# Patient Record
Sex: Female | Born: 1955 | Race: Black or African American | Hispanic: No | Marital: Single | Smoking: Former smoker
Health system: Southern US, Community
[De-identification: ages and names within clinical notes are randomized; demographics above are authoritative.]

## PROBLEM LIST (undated history)

## (undated) ENCOUNTER — Inpatient Hospital Stay: Admission: EM | Payer: Self-pay | Source: Home / Self Care

## (undated) DIAGNOSIS — R05 Cough: Secondary | ICD-10-CM

## (undated) DIAGNOSIS — R059 Cough, unspecified: Secondary | ICD-10-CM

## (undated) DIAGNOSIS — H7290 Unspecified perforation of tympanic membrane, unspecified ear: Secondary | ICD-10-CM

## (undated) DIAGNOSIS — M1990 Unspecified osteoarthritis, unspecified site: Secondary | ICD-10-CM

## (undated) DIAGNOSIS — I10 Essential (primary) hypertension: Secondary | ICD-10-CM

## (undated) DIAGNOSIS — E119 Type 2 diabetes mellitus without complications: Secondary | ICD-10-CM

## (undated) DIAGNOSIS — C801 Malignant (primary) neoplasm, unspecified: Secondary | ICD-10-CM

## (undated) DIAGNOSIS — C50411 Malignant neoplasm of upper-outer quadrant of right female breast: Secondary | ICD-10-CM

## (undated) HISTORY — DX: Malignant neoplasm of upper-outer quadrant of right female breast: C50.411

## (undated) HISTORY — PX: ABDOMINAL HYSTERECTOMY: SHX81

## (undated) HISTORY — PX: COLONOSCOPY: SHX174

## (undated) HISTORY — PX: OTHER SURGICAL HISTORY: SHX169

---

## 2006-02-06 ENCOUNTER — Emergency Department (HOSPITAL_COMMUNITY): Admission: EM | Admit: 2006-02-06 | Discharge: 2006-02-06 | Payer: Self-pay | Admitting: Emergency Medicine

## 2006-07-16 ENCOUNTER — Ambulatory Visit: Payer: Self-pay | Admitting: Internal Medicine

## 2006-07-16 ENCOUNTER — Inpatient Hospital Stay (HOSPITAL_COMMUNITY): Admission: EM | Admit: 2006-07-16 | Discharge: 2006-07-19 | Payer: Self-pay | Admitting: Emergency Medicine

## 2006-09-22 ENCOUNTER — Ambulatory Visit: Payer: Self-pay | Admitting: Orthopedic Surgery

## 2006-09-22 ENCOUNTER — Emergency Department (HOSPITAL_COMMUNITY): Admission: EM | Admit: 2006-09-22 | Discharge: 2006-09-22 | Payer: Self-pay | Admitting: Emergency Medicine

## 2006-09-24 ENCOUNTER — Ambulatory Visit (HOSPITAL_COMMUNITY): Admission: RE | Admit: 2006-09-24 | Discharge: 2006-09-24 | Payer: Self-pay | Admitting: Orthopedic Surgery

## 2006-09-24 ENCOUNTER — Ambulatory Visit: Payer: Self-pay | Admitting: Orthopedic Surgery

## 2006-09-27 ENCOUNTER — Emergency Department (HOSPITAL_COMMUNITY): Admission: EM | Admit: 2006-09-27 | Discharge: 2006-09-27 | Payer: Self-pay | Admitting: Emergency Medicine

## 2006-10-11 ENCOUNTER — Ambulatory Visit: Payer: Self-pay | Admitting: Orthopedic Surgery

## 2006-10-19 ENCOUNTER — Ambulatory Visit: Payer: Self-pay | Admitting: Orthopedic Surgery

## 2006-10-27 ENCOUNTER — Ambulatory Visit: Payer: Self-pay | Admitting: Orthopedic Surgery

## 2006-11-10 ENCOUNTER — Ambulatory Visit: Payer: Self-pay | Admitting: Orthopedic Surgery

## 2006-12-13 ENCOUNTER — Ambulatory Visit: Payer: Self-pay | Admitting: Orthopedic Surgery

## 2007-03-23 ENCOUNTER — Ambulatory Visit: Payer: Self-pay | Admitting: Orthopedic Surgery

## 2008-09-24 ENCOUNTER — Emergency Department (HOSPITAL_COMMUNITY): Admission: EM | Admit: 2008-09-24 | Discharge: 2008-09-24 | Payer: Self-pay | Admitting: Emergency Medicine

## 2010-10-16 LAB — RAPID STREP SCREEN (MED CTR MEBANE ONLY): Streptococcus, Group A Screen (Direct): POSITIVE — AB

## 2010-11-21 NOTE — H&P (Signed)
[name], [name]              ACCOUNT NO.:  [id number]   MEDICAL RECORD NO.:  [id number]          PATIENT TYPE:  AMB   LOCATION:  DAY                           FACILITY:  [facility]   PHYSICIAN:  Vickki Hearing, M.D.DATE OF BIRTH:  06-06-1956   DATE OF ADMISSION:  09/22/2006  DATE OF DISCHARGE:  LH                              HISTORY & PHYSICAL   CHIEF COMPLAINT:  Right wrist pain.   HISTORY OF PRESENT ILLNESS:  This is a 55 year old female with no  allergies or medical problems.  No previous surgery.  On no medications.  Family history of diabetes.  Employed as a quality associated as an  Nurse, adult.  Has a twelfth grade education and no social  habits.  Presents after a fall on March 19 in which she injured her  right wrist.  She complains of constant, dull, aching, acute onset pain  grade 10/10 in the right wrist associated with swelling.  She was  treated with splint in the ER and a shot of pain medications.   REVIEW OF SYSTEMS:  Negative.   PHYSICAL EXAMINATION:  VITAL SIGNS:  Weight 210, pulse 80, respirations  16.  GENERAL:  Appearance normal, pulse is excellent, no swelling, negative  lymphadenopathy.  MUSCULOSKELETAL:  Normal gait, but her wrist is deformed, tender,  swollen, crepitance on range of motion, increased pain with motion, poor  grip strength and poor ability to move the fingers.  SKIN:  Multiple abrasions right eye, right knee, left elbow, right  shoulder.  NEUROLOGICAL:  Normal.  PSYCHIATRIC:  Awake, alert and oriented x3.   STUDIES:  X-rays done at [facility] show a volarly displaced  comminuted distal radius fracture.   PLAN:  Open treatment internal fixation, right wrist with a DVR plate  from a volar approach (code [id number])      Vickki Hearing, M.D.  Electronically Signed     SEH/MEDQ  D:  09/22/2006  T:  09/23/2006  Job:  [id number]

## 2010-11-21 NOTE — Discharge Summary (Signed)
[name], [name]              ACCOUNT NO.:  [id number]   MEDICAL RECORD NO.:  [id number]          PATIENT TYPE:  INP   LOCATION:  A304                          FACILITY:  [facility]   PHYSICIAN:  Skeet Latch        DATE OF BIRTH:  Oct 03, 1955   DATE OF ADMISSION:  07/16/2006  DATE OF DISCHARGE:  01/14/2008LH                               DISCHARGE SUMMARY   DISCHARGE DIAGNOSES:  1. Right lower quadrant pain.  2. Hypokalemia.  3. Hypertension.   HOSPITAL COURSE:  [name] is a 55 year old African-American female  who presented to the ER with complaint of right lower abdominal pain.  The patient states the pain was not radiating.  She denied any  significant history of any previous abdominal pain or history of  vomiting or urinary complaints.  Due to the complaints and severe  nature, patient was admitted.  Patient had a CT of her abdomen and  pelvis which were negative.  Patient also had an ultrasound of her  abdomen which was also negative.  The patient also had a nuclear  medicine hepatobiliary scan which showed a low gallbladder ejection  fraction but was otherwise normal.  Patient received IV pain medication  as well as IV antibiotics which have since been changed p.o.  Patient's  labs are normal and her pain is no longer present and patient is ready  for discharge.   MEDICATIONS ON DISCHARGE:  1. Protonix 40 mg daily.  2. Levaquin 500 mg p.o. daily x7 days.  3. Aspirin 81 mg p.o. daily.   The patient instructed to have a low sodium diet, also to follow up with  a primary care physician within 1-2 weeks.                                            ______________________________  Skeet Latch     SM/MEDQ  D:  07/19/2006  T:  07/19/2006  Job:  [id number]

## 2010-11-21 NOTE — Consult Note (Signed)
NAMELUCIANNE, [name]              ACCOUNT NO.:  [id number]   MEDICAL RECORD NO.:  [id number]          PATIENT TYPE:  INP   LOCATION:  A304                          FACILITY:  [facility]   PHYSICIAN:  Lionel December, M.D.    DATE OF BIRTH:  [birth year]   DATE OF CONSULTATION:  07/17/2006  DATE OF DISCHARGE:                                 CONSULTATION   REASON FOR CONSULTATION:  Abdominal pain and abnormal ultrasound.   HISTORY OF PRESENT ILLNESS:  [name] is a 55 year old Caucasian female  who was admitted to Dr. Maxwell Caul service yesterday via emergency room  where she presented with a 12-hour history of lower abdominal pain.   The patient states she was fine until the evening of July 15, 2006.  Within 30 minutes of eating spaghetti and drinking some cool-aid she  developed sharp pain across her lower abdomen.  It was a constant pain  but at times will get even worse, associated with nausea.  She did not  experience vomiting, fever, diarrhea, dysuria, or hematuria.  The pain  continued throughout the night.  She came to the emergency room  yesterday morning.  She was evaluated in the emergency room.  She had  lab studies.  Her WBC was mildly elevated at 11,000.  Her chemistry  panel was pertinent for a glucose of 165, and her alkaline phosphatase  was mildly elevated at 145.  She apparently was in emergency room for  several hours.  She had an upper abdominal ultrasound which revealed a  bile duct of 6.1 mm, and there were one or two echogenic foci within the  distal duct suspicious for stones.  There were no stones in the  gallbladder.  She subsequently had abdominopelvic CT without contrast,  and no abnormalities were noted.  Specifically, there was no evidence of  appendicitis.  The patient was begun on IV fluids and IV antibiotics.  She finally had a hepatobiliary scan which revealed patent cystic ducts.  Gallbladder visualized within 15 minutes, and activity was seen.  Her  ejection fraction was calculated to be low at 9.8%.   The patient feels better this afternoon.  She states the pain is now  located in the right lower quadrant.  It is constant but at times more  noticeable.  She has not asked for any pain medicines.  She has been  keeping clear liquids down.  She has not had a BM since she was  hospitalized.  There is no history of headache, sore throat, skin rash,  or joint pains.  There is also no history of recent weight loss or  anorexia.  She also denies heartburn or dysphagia.   HOME MEDICATIONS:  1. Zocor, question dose.  2. Norvasc daily.  3. KCl.   CURRENT MEDICATIONS:  1. Lovenox 40 mg subcutaneously q.24h.  2. Levaquin 750 mg IV q.24h.  3. Metronidazole 5 mg IV q.8h.  4. Protonix 40 mg IV q.24h.  5. Tylenol p.r.n.   PAST MEDICAL HISTORY:  1. She has been hypertensive for few years.  2. She has hypercholesterolemia.  3. She had left  ear surgery for drum perforation about 15 years ago.      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  '  4. History of cervical carcinoma, and she had hysterectomy 28 years      ago.   ALLERGIES:  NONE KNOWN.   FAMILY HISTORY:  Mother was hypertensive and died of MI at age 68.  Father had unknown malignancy and died at age 78.  She has two sisters  and six brothers who are in good health.   SOCIAL HISTORY:  She has never married.  She has two sons in good  health.  She has been working at [employer] for the last 3  years, and prior to that she worked at [employer].  She has never  smoked cigarettes.  She drinks alcohol occasionally.   PHYSICAL EXAMINATION:  GENERAL:  Pleasant, well-developed, well-  nourished [place] American female who is in no acute distress.  VITAL SIGNS:  She weighs 191.8 pounds.  She 64 inches tall.  Pulse 92  per minute, blood pressure 131/90, temperature is 98.4, respiratory rate  is 18.  HEENT:  Conjunctivae is pink.  Sclerae nonicteric.  Oral pharyngeal   mucosa is normal.  NECK:  No neck masses are noted.  CARDIAC:  Regular rhythm.  Normal S1-S2.  No murmur or gallop noted.  LUNGS:  Clear to auscultation.  ABDOMEN:  Her abdomen is symmetrical.  Bowel sounds are normal.  She has  soft abdomen with mild-to-moderate tenderness at the right lower  quadrant which is laterally close to anterior superior iliac spine.  No  hepatosplenomegaly noted.  RECTAL:  Examination reveals scant amount of stool in the vault which is  guaiac-negative.  EXTREMITIES:  No clubbing or edema noted.   LABORATORY DATA:  Lab studies from admission:  WBC 11.0, H&H 14.9 and  43.3, platelet count 275 K.  She had 81 neutrophils.  Serum sodium 137,  potassium 3.6, chloride 103, CO2 is 25, glucose 165, BUN 7, creatinine  0.73.  Bilirubin is 0.8, AP 145, AST 23, ALT 28, total protein 7.4, with  albumin of 4.0, calcium of 9.6, amylase was 59, lipase 24.  Urinalysis  was negative.   Her WBC from this morning is 12.0 with 71 segs and 0 bands.  Her glucose  was 145.  Total bilirubin 1.3, alkaline phosphatase is 124, AST 16, ALT  22, and albumin 3.1.   CT and ultrasound reviewed.   ASSESSMENT:  [name] is a 55 year old African-American female who was  admitted with acute onset of lower abdominal pain which is now localized  to the right lower quadrant.  She has improved with symptomatic therapy.  She is on intravenous Cipro and metronidazole.  She has mild  leukocytosis.  She also has mildly elevated alkaline phosphatase with  downward trend but normal transaminases, amylase, and lipase.   As far as her biliary tract is concerned, I am not convinced that she  has stones in her bile duct.  Her bile duct is normal caliber, and  transaminases are normal.  Mildly elevated alkaline phosphatase is  really nonspecific.  I doubt that she has biliary tract disease, but her  ultrasound will be reviewed with the radiologist.  Right lower quadrant abdominal pain.  I suspect  enteritis or mesenteric  adenitis, although no evidence of an adenopathy on unenhanced CT.  Although no evidence of appendicitis, but this cannot be ruled out yet.   RECOMMENDATIONS:  She will have  CBC and LFTs in a.m.   Would agree with empiric therapy with antibiotic therapy for now.  Next,  I have taken the liberty of ordering Dilaudid 1 mg IV q.4h. p.r.n. pain.  The patient will be reevaluated in the a.m., and if she remains with  pain and tenderness, she will need to undergo repeat abdominopelvic CT  with oral and IV contrast.   We appreciate the opportunity to participate in the care of this nice  lady.      Lionel December, M.D.  Electronically Signed     NR/MEDQ  D:  07/17/2006  T:  07/18/2006  Job:  [id number]

## 2010-11-21 NOTE — Op Note (Signed)
NAMEJAZYAH, [name]              ACCOUNT NO.:  [id number]   MEDICAL RECORD NO.:  [id number]          PATIENT TYPE:  AMB   LOCATION:  DAY                           FACILITY:  [facility]   PHYSICIAN:  Vickki Hearing, M.D.DATE OF BIRTH:  1956-05-15   DATE OF PROCEDURE:  09/24/2006  DATE OF DISCHARGE:                               OPERATIVE REPORT   HISTORY:  This is a 55 year old female who presented after a fall.  She  sustained an intra-articular two-part fracture of the distal radius.  It  was a closed injury.  There was significant volar displacement and  deformity.  She was scheduled for surgery for today, September 24, 2006.  Informed consent process completed.   She has no allergies or medical problems and no previous surgery.  She  has a family history of diabetes   PREOPERATIVE DIAGNOSIS:  Closed fracture, right distal radius, intra-  articular, two-part.   POSTOPERATIVE DIAGNOSIS:  Closed fracture, right distal radius, intra-  articular, two-part.   PROCEDURE:  Open treatment and internal fixation, right wrist, with the  DVR volar plate and 1 mL of DBX bone putty.   SURGEON:  Vickki Hearing, M.D.   ASSISTANT:  Chinita Pester.   ANESTHESIA:  General.   SPECIMENS:  None.   PATHOLOGY:  None.   ESTIMATED BLOOD LOSS:  Minimal.   COMPLICATIONS:  None.   DISPOSITION:  The patient to PACU in good condition.   DESCRIPTION OF PROCEDURE:  This patient was identified in the  preoperative holding area as [name].  The right wrist marked  for surgery, countersigned by the surgeon.  History and physical  updated.  Antibiotics were started.  The patient was taken to the  operating room for general anesthetic.  The right arm was prepped and  draped with sterile technique.  A timeout procedure was completed.   A modified Henry approach to the distal radius was performed.  A skin  incision was made along the flexor carpi radialis tendon.  The tendon  sheath  was opened up.  Dissection was carried out to the superficial  branch of the radial artery, and then disk dissection was extended down  to the scaphoid tubercle.  The tendon was retracted radially.  The floor  of the tendon was opened.  The pronator quadratus was incised and  retracted ulnarly.  The floor of the extensor compartment #1 was opened.  The brachial radialis was then released.  Open fracture reduction was  performed.  Volar wrist plate was applied using technique as described  in the technique guide.  We used regular screws proximally, pegs and  threaded screws distally on the AP, lateral, and modified 30-degree  lateral views.  The reduction was very good.  There was a gap volarly,  and this was filled with DBX bone putty.  The wound was irrigated and  closed with 2-0 Monocryl, and we injected 20 mL of Marcaine into the  skin edges.  She was placed in a volar splint in slight extension.  The  tourniquet was released.  Fingers were viable.  ADDENDUM:  Partial laceration of the abductor pollicis longus was  repaired with nonabsorbable suture.   POSTOPERATIVE PLAN:  This patient will be seen by Dr. Hilda Lias on Monday.  That appointment has been scheduled for 9:00 a.m.  At that time, she  just needs to have a cast check for swelling.  It is okay to remove the  splint if needed for a dressing change if necessary; otherwise, she can  follow up in my office the next week.      Vickki Hearing, M.D.  Electronically Signed     SEH/MEDQ  D:  09/24/2006  T:  09/24/2006  Job:  [id number]   cc:   Teola Bradley, M.D.  Fax: [phone]

## 2010-11-21 NOTE — H&P (Signed)
Christine [name], Christine [name]              ACCOUNT NO.:  [id number]   MEDICAL RECORD NO.:  [id number]          PATIENT TYPE:  INP   LOCATION:  A304                          FACILITY:  [facility]   PHYSICIAN:  Christine Moores, MD   DATE OF BIRTH:  [birth year]   DATE OF ADMISSION:  07/16/2006  DATE OF DISCHARGE:  LH                              HISTORY & PHYSICAL   PRIMARY CARE PHYSICIAN:  Unassigned.   CHIEF COMPLAINT:  Right lower abdominal pain for one day's duration.   HISTORY OF PRESENT ILLNESS:  Ms. Christine [name] is a 55 year old female patient  with no significant past medical history, who presented to the ER with  right lower abdominal pain, which is localized on the umbilical area  towards the right lower abdomen, about 7/10 in severity.  Not radiating.  Localizes to the specified area.  She denied any history of vomiting.  No urinary frequency or urgency.  She did not notice any discoloration  of her urine.  She denied any diarrhea.  Her last bowel movement was  yesterday evening.  No fever.  No chest pain.  No cough.  Mild headache.  She denied any vaginal discharge.   REVIEW OF SYSTEMS:  As detailed in the HPI, otherwise it is  noncontributory.   ALLERGIES:  No known drug allergies.   MEDICATIONS:  None.   PAST MEDICAL HISTORY:  None.   SOCIAL HISTORY:  She is single, living alone.  She has two sons.  She  said she is working currently and she denied smoking.  She denied  alcohol intake.  She denied any illicit drugs.   FAMILY HISTORY:  She said there is no family medical problem that she is  aware of.   PHYSICAL EXAMINATION:  GENERAL:  She is lying in the bed in no acute  respiratory distress.  VITAL SIGNS:  Blood pressure is 155/92.  Temperature is 98.4.  Pulse is  86 per minute.  Respiratory rate is 20 per minute.  Saturating at 97% on  room air.  HEENT:  She has pink conjunctivae, anicteric sclerae.  Throat is clear.  NECK:  Supple.  No lymphadenopathy.  CHEST:  Good air  entry bilaterally.  No  creps  CVS:  She has S1 and S2, regular, well heard.  No murmur or gallop.  ABDOMEN:  Full.  Soft.  There is localized tenderness on the right lower  quadrant area, but there is no guarding or rebound tenderness currently.  No rigidity.  No mass or organomegaly is appreciated.  MUSCULOSKELETAL:  No abnormality detected.  GENITOURINARY:  Normal.  SKIN:  Moist.  There are no abnormal lesions found.  EXTREMITIES:  No pedal edema.  Peripheral pulses are positive  bilaterally.  CNS:  Alert and oriented x3.   On the labs.  white blood cell count, 11,000, hematocrit 43.3, platelets 275.  Chemistries:  Sodium is 137, potassium 3.6, chloride 103, bicarb 25, BUN  7, creatinine 0.7, calcium 9.6.  Urinalysis is negative.   CHEST X-RAY:  No acute abnormalities.   Pelvic CAT scan without contrast showed no acute abnormality.  CAT  scan  of the abdomen without contrast showed no acute abnormality in the  abdomen, but there is suspicion of small stones in the distal common  bile duct.  The common duct is slightly prominent at 6.1 mm but no  visible stones or sludge are seen.   HIDA also was done for suspicious for of cholecystitis, but it was not  conclusive.   ASSESSMENT:  Acute right-sided lower abdominal pain with no objective  finding of acute surgical abdomen with suspicion of cholecystitis versus  acute appendicitis.  The plan is to admit her to the floor and give her  antibiotics, IV fluids, and reassess her abdomen and her vital signs.  Send CBC and chemistry in the morning.  Dr. Malvin [name] will see her as a  surgical consultation.  Already he was informed by the ER physician and  he said he would come in and reassess her for surgical abdomen and  possible gallstones also.      Christine Moores, MD  Electronically Signed     MT/MEDQ  D:  07/16/2006  T:  07/16/2006  Job:  [id number]

## 2013-12-24 ENCOUNTER — Emergency Department (HOSPITAL_COMMUNITY)
Admission: EM | Admit: 2013-12-24 | Discharge: 2013-12-24 | Disposition: A | Discharge status: Home / Self Care | Payer: 59 | Attending: Emergency Medicine | Admitting: Emergency Medicine

## 2013-12-24 ENCOUNTER — Encounter (HOSPITAL_COMMUNITY): Payer: Self-pay | Admitting: Emergency Medicine

## 2013-12-24 DIAGNOSIS — R51 Headache: Secondary | ICD-10-CM | POA: Insufficient documentation

## 2013-12-24 DIAGNOSIS — Z8669 Personal history of other diseases of the nervous system and sense organs: Secondary | ICD-10-CM | POA: Insufficient documentation

## 2013-12-24 DIAGNOSIS — Z87891 Personal history of nicotine dependence: Secondary | ICD-10-CM | POA: Insufficient documentation

## 2013-12-24 DIAGNOSIS — R519 Headache, unspecified: Secondary | ICD-10-CM

## 2013-12-24 DIAGNOSIS — I10 Essential (primary) hypertension: Secondary | ICD-10-CM | POA: Insufficient documentation

## 2013-12-24 DIAGNOSIS — E119 Type 2 diabetes mellitus without complications: Secondary | ICD-10-CM | POA: Insufficient documentation

## 2013-12-24 HISTORY — DX: Type 2 diabetes mellitus without complications: E11.9

## 2013-12-24 HISTORY — DX: Essential (primary) hypertension: I10

## 2013-12-24 HISTORY — DX: Unspecified perforation of tympanic membrane, unspecified ear: H72.90

## 2013-12-24 MED ORDER — ONDANSETRON 4 MG PO TBDP
4.0000 mg | ORAL_TABLET | Freq: Once | ORAL | Status: AC
  Administered 2013-12-24: 4 mg via ORAL
  Filled 2013-12-24: qty 1

## 2013-12-24 MED ORDER — DIPHENHYDRAMINE HCL 50 MG/ML IJ SOLN
25.0000 mg | Freq: Once | INTRAMUSCULAR | Status: DC
  Filled 2013-12-24: qty 1

## 2013-12-24 MED ORDER — SODIUM CHLORIDE 0.9 % IV BOLUS (SEPSIS): 1000.0000 mL | Freq: Once | INTRAVENOUS | Status: DC

## 2013-12-24 MED ORDER — METOCLOPRAMIDE HCL 5 MG/ML IJ SOLN
10.0000 mg | Freq: Once | INTRAMUSCULAR | Status: DC
  Filled 2013-12-24: qty 2

## 2013-12-24 MED ORDER — TRAMADOL HCL 50 MG PO TABS: 50.0000 mg | ORAL_TABLET | Freq: Four times a day (QID) | ORAL | Status: DC | PRN

## 2013-12-24 MED ORDER — OXYCODONE-ACETAMINOPHEN 5-325 MG PO TABS
1.0000 | ORAL_TABLET | Freq: Once | ORAL | Status: AC
  Administered 2013-12-24: 1 via ORAL
  Filled 2013-12-24: qty 1

## 2013-12-24 MED ORDER — KETOROLAC TROMETHAMINE 30 MG/ML IJ SOLN
30.0000 mg | Freq: Once | INTRAMUSCULAR | Status: DC
Start: 2013-12-24 — End: 2013-12-24
  Filled 2013-12-24: qty 1

## 2013-12-24 NOTE — Discharge Instructions (Signed)
Follow up next week if not improving. °

## 2013-12-24 NOTE — ED Provider Notes (Signed)
CSN: [id number]     Arrival date & time 12/24/13  1728 History  This chart was scribed for Christine Diego, MD by Steva Colder, ED Scribe. The patient was seen in room APA10/APA[facility] at 5:59 PM.   Chief Complaint  Patient presents with  . Headache   Patient is a 58 y.o. female presenting with headaches. The history is provided by the patient. No language interpreter was used.  Headache Pain location:  Frontal Radiates to:  Does not radiate Onset quality:  Sudden Duration:  6 hours Timing:  Constant Progression:  Unchanged Chronicity:  New Relieved by:  Nothing Ineffective treatments:  NSAIDs (Aleve) Associated symptoms: no abdominal pain, no back pain, no congestion, no cough, no diarrhea, no fatigue, no fever, no neck pain, no seizures and no sinus pressure    HPI Comments: [name] is a 58 y.o. female who presents to the Emergency Department complaining of a headache onset noon today. She states that she woke up at noon today and her head was hurting on the front right side. She states that she has not had a HA like this before. She states that she took two Aleves' today with no relief for her symptom. She states that she currently feels nauseated. She denies fever, chills, and neck pain.   Past Medical History  Diagnosis Date  . Hypertension   . Diabetes mellitus without complication   . Ruptured eardrum     left    History reviewed. No pertinent past surgical history. History reviewed. No pertinent family history. History  Substance Use Topics  . Smoking status: Former Research scientist (life sciences)  . Smokeless tobacco: Not on file  . Alcohol Use: No   OB History   Grav Para Term Preterm Abortions TAB SAB Ect Mult Living                 Review of Systems  Constitutional: Negative for fever, appetite change and fatigue.  HENT: Negative for congestion, ear discharge and sinus pressure.   Eyes: Negative for discharge.  Respiratory: Negative for cough.   Cardiovascular: Negative for  chest pain.  Gastrointestinal: Negative for abdominal pain and diarrhea.  Genitourinary: Negative for frequency and hematuria.  Musculoskeletal: Negative for back pain and neck pain.  Skin: Negative for rash.  Neurological: Negative for seizures and headaches.  Psychiatric/Behavioral: Negative for hallucinations.      Allergies  Review of patient's allergies indicates not on file.  Home Medications   Prior to Admission medications   Not on File   BP 137/79  Pulse 87  Temp(Src) 98.4 F (36.9 C) (Oral)  Resp 18  Ht 5' 4.5" (1.638 m)  Wt 182 lb (82.555 kg)  BMI 30.77 kg/m2  SpO2 95%  Physical Exam  Nursing note and vitals reviewed. Constitutional: She is oriented to person, place, and time. She appears well-developed.  HENT:  Head: Normocephalic.  Eyes: Conjunctivae and EOM are normal. No scleral icterus.  Neck: Neck supple. No thyromegaly present.  Cardiovascular: Normal rate and regular rhythm.  Exam reveals no gallop and no friction rub.   No murmur heard. Pulmonary/Chest: No stridor. She has no wheezes. She has no rales. She exhibits no tenderness.  Abdominal: She exhibits no distension. There is no tenderness. There is no rebound.  Musculoskeletal: Normal range of motion. She exhibits no edema.  Lymphadenopathy:    She has no cervical adenopathy.  Neurological: She is oriented to person, place, and time. She exhibits normal muscle tone. Coordination normal.  Skin: No rash noted. No erythema.  Psychiatric: She has a normal mood and affect. Her behavior is normal.    ED Course  Procedures (including critical care time) DIAGNOSTIC STUDIES: Oxygen Saturation is 95% on room air, adequate by my interpretation.    COORDINATION OF CARE: 6:01 PM-Discussed treatment plan which includes pain medication with pt at bedside and pt agreed to plan.   Labs Review Labs Reviewed - No data to display  Imaging Review No results found.   EKG Interpretation None      MDM    Final diagnoses:  None  pt improved with tx   I personally performed the services described in this documentation, which was scribed in my presence. The recorded information has been reviewed and is accurate.  The chart was scribed for me under my direct supervision.  I personally performed the history, physical, and medical decision making and all procedures in the evaluation of this patient.Christine Diego, MD 12/24/13 1919

## 2013-12-24 NOTE — ED Notes (Addendum)
Pt reports a headache that started at noon today. Pt denies any changes in vision,dizziness. Pt reports sensitivity to light/sound. Pt reports took otc tylenol at home prior to arrival. nad noted. No neuro deficits noted in triage. Pt alert and oriented.

## 2013-12-24 NOTE — ED Notes (Signed)
Went in pt room to start IV, pt states she does not want any IV and would like to have pills instead. MD notified orders obtained.

## 2016-07-13 ENCOUNTER — Other Ambulatory Visit: Payer: Self-pay | Admitting: General Surgery

## 2016-07-13 NOTE — H&P (Signed)
[name] 07/13/2016 3:06 PM Location: [facility] Patient #: [id number] DOB: 09-04-1955 Single / Language: Cleophus Molt / Race: Black or African American Female   History of Present Illness Stark Klein MD; 07/13/2016 5:12 PM) The patient is a 61 year old female who presents with breast cancer. Patient is a 61 year old female referred by Dr. Manuella Ghazi for consultation regarding a new diagnosis of right breast cancer. The patient presented with a palpable mass in her right breast. She's not sure how long it has been there. She was also found to have a mass in her axilla. Diagnostic imaging was performed which demonstrated a 3.5 cm mass at 10:00 6 cm from the right nipple. She also was found to have a enlarged lymph node on imaging. Both of these were biopsied percutaneously. Of note, the axillary lymph node was also clipped after biopsy. 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 Prognostic panel is ER positive, PR negative, HER-2/neu not overexpressed. Ki-67 is 90%. She denies any new joint pain or bone pain. She has had a recent cough.  She has a maternal cousin who had breast cancer. She states that her maternal grandfather had some type of cancer that came from smoking, but she is not sure what kind.    Past Surgical History Nance Pear, [place]; 07/13/2016 3:09 PM) Hysterectomy (due to cancer) - Complete   Diagnostic Studies History Nance Pear, [place]; 07/13/2016 3:09 PM) Colonoscopy  1-5 years ago  Allergies Nance Pear, CMA; 07/13/2016 3:10 PM) No Known Drug Allergies 07/13/2016  Medication History Nance Pear, [place]; 07/13/2016 3:11 PM) AmLODIPine Besylate (10MG Tablet, Oral daily) Active. Atorvastatin Calcium (10MG Tablet, Oral daily) Active. Ibuprofen (800MG Tablet, Oral as needed) Active. Lisinopril (40MG Tablet, Oral daily) Active. GlyBURIDE-MetFORMIN (5-500MG Tablet, Oral daily) Active. Fexofenadine HCl (180MG Tablet, Oral daily)  Active. Multiple Vitamin (Oral daily) Active. Vitamin D (Cholecalciferol) (1000UNIT Capsule, Oral daily) Active. Calcium (500MG Tablet, Oral daily) Active. Omega 3 (1000MG Capsule, Oral daily) Active. Medications Reconciled  Social History Nance Pear, [place]; 07/13/2016 3:09 PM) Caffeine use  Coffee. No alcohol use  No drug use  Tobacco use  Former smoker.  Family History Nance Pear, [place]; 07/13/2016 3:09 PM) Alcohol Abuse  Father. Arthritis  Mother. Diabetes Mellitus  Mother. Hypertension  Mother.  Pregnancy / Birth History Nance Pear, [place]; 07/13/2016 3:09 PM) Age of menopause  17-50 Maternal age  78-20 Para  2  Other Problems Nance Pear, [place]; 07/13/2016 3:09 PM) Diabetes Mellitus  High blood pressure     Review of Systems Nance Pear CMA; 07/13/2016 3:09 PM) General Not Present- Appetite Loss, Chills, Fatigue, Fever, Night Sweats, Weight Gain and Weight Loss. Skin Not Present- Change in Wart/Mole, Dryness, Hives, Jaundice, New Lesions, Non-Healing Wounds, Rash and Ulcer. HEENT Present- Hearing Loss, Ringing in the Ears, Seasonal Allergies and Wears glasses/contact lenses. Not Present- Earache, Hoarseness, Nose Bleed, Oral Ulcers, Sinus Pain, Sore Throat, Visual Disturbances and Yellow Eyes. Respiratory Not Present- Bloody sputum, Chronic Cough, Difficulty Breathing, Snoring and Wheezing. Breast Present- Breast Mass. Not Present- Breast Pain, Nipple Discharge and Skin Changes. Cardiovascular Present- Leg Cramps. Not Present- Chest Pain, Difficulty Breathing Lying Down, Palpitations, Rapid Heart Rate, Shortness of Breath and Swelling of Extremities. Gastrointestinal Not Present- Abdominal Pain, Bloating, Bloody Stool, Change in Bowel Habits, Chronic diarrhea, Constipation, Difficulty Swallowing, Excessive gas, Gets full quickly at meals, Hemorrhoids, Indigestion, Nausea, Rectal Pain and Vomiting. Female Genitourinary Not Present- Frequency, Nocturia,  Painful Urination, Pelvic Pain and Urgency. Musculoskeletal Present- Joint  Pain and Joint Stiffness. Not Present- Back Pain, Muscle Pain, Muscle Weakness and Swelling of Extremities. Neurological Present- Numbness. Not Present- Decreased Memory, Fainting, Headaches, Seizures, Tingling, Tremor, Trouble walking and Weakness. Psychiatric Not Present- Anxiety, Bipolar, Change in Sleep Pattern, Depression, Fearful and Frequent crying. Endocrine Present- Hot flashes. Not Present- Cold Intolerance, Excessive Hunger, Hair Changes, Heat Intolerance and New Diabetes. Hematology Not Present- Blood Thinners, Easy Bruising, Excessive bleeding, Gland problems, HIV and Persistent Infections.  Vitals Bary Castilla Bradford CMA; 07/13/2016 3:11 PM) 07/13/2016 3:11 PM Weight: 188 lb Height: 64.5in Body Surface Area: 1.92 m Body Mass Index: 31.77 kg/m  Temp.: 98.42F  Pulse: 85 (Regular)  BP: 126/78 (Sitting, Left Arm, Standard)       Physical Exam Stark Klein MD; 07/13/2016 5:14 PM) General Mental Status-Alert. General Appearance-Consistent with stated age. Hydration-Well hydrated. Voice-Normal.  Head and Neck Head-normocephalic, atraumatic with no lesions or palpable masses. Trachea-midline. Thyroid Gland Characteristics - normal size and consistency.  Eye Eyeball - Bilateral-Extraocular movements intact. Sclera/Conjunctiva - Bilateral-No scleral icterus.  Chest and Lung Exam Chest and lung exam reveals -quiet, even and easy respiratory effort with no use of accessory muscles and on auscultation, normal breath sounds, no adventitious sounds and normal vocal resonance. Inspection Chest Wall - Normal. Back - normal.  Breast Note: 3-4 cm mobile mass in the upper outer quadrant of the right breast. some faint overlying bruising, but no large hematoma. Palpable 2-3 cm LN in the axilla. mobile. no additional LAD palpated in the infraclavicular or supraclavicular location. No  matted nodes are palpated. Both breasts are ptotic bilaterally. No skin dimpling, nipple retraction or nipple discharge. left breast negative.   Cardiovascular Cardiovascular examination reveals -normal heart sounds, regular rate and rhythm with no murmurs and normal pedal pulses bilaterally.  Abdomen Inspection Inspection of the abdomen reveals - No Hernias. Palpation/Percussion Palpation and Percussion of the abdomen reveal - Soft, Non Tender, No Rebound tenderness, No Rigidity (guarding) and No hepatosplenomegaly. Auscultation Auscultation of the abdomen reveals - Bowel sounds normal.  Neurologic Neurologic evaluation reveals -alert and oriented x 3 with no impairment of recent or remote memory. Mental Status-Normal.  Musculoskeletal Global Assessment -Note: no gross deformities.  Normal Exam - Left-Upper Extremity Strength Normal and Lower Extremity Strength Normal. Normal Exam - Right-Upper Extremity Strength Normal and Lower Extremity Strength Normal.  Lymphatic Head & Neck  General Head & Neck Lymphatics: Bilateral - Description - Normal. Axillary  General Axillary Region: Bilateral - Description - Normal. Tenderness - Non Tender. Femoral & Inguinal  Generalized Femoral & Inguinal Lymphatics: Bilateral - Description - No Generalized lymphadenopathy.    Assessment & Plan Stark Klein MD; 07/13/2016 5:18 PM) MALIGNANT NEOPLASM OF UPPER-OUTER QUADRANT OF RIGHT BREAST IN FEMALE, ESTROGEN RECEPTOR POSITIVE (C50.411) Impression: Patient presents with a new diagnosis of clinical stage IIb right breast cancer. Based on her clinically positive lymph node, I have ordered staging studies including a CT of the chest abdomen and pelvis, and a bone scan.  I will refer her to Dr. Ancil Linsey at [facility]. She will get neoadjuvant chemotherapy. I will place port next week. I reviewed placement and risks.  I have advised her to discuss with HR what sort  of forms she will need to get her treatment. She will likely need FMLA paperwork filled out.  Hopefully she will have a good response to chemotherapy. If we can downstage her axilla, we may be able to do a targeted sentinel node biopsy instead of an ALND.  She will also be referred to radiation oncology, but will not need XRT until post op which will be about 6 months. She is a lumpectomy candidate. 45 min spent in evaluation, examination, counseling, and coordination of care. >50% spent in counseling. Current Plans You are being scheduled for surgery- Our schedulers will call you.  You should hear from our office's scheduling department within 5 working days about the location, date, and time of surgery. We try to make accommodations for patient's preferences in scheduling surgery, but sometimes the OR schedule or the surgeon's schedule prevents [place] from making those accommodations.  If you have not heard from our office [phone]) in 5 working days, call the office and ask for your surgeon's nurse.  If you have other questions about your diagnosis, plan, or surgery, call the office and ask for your surgeon's nurse.  Referred to Oncology, for evaluation and follow up (Oncology). Routine. Referred to Radiation Oncology, for evaluation and follow up (Radiation Oncology). Routine. CT ABDOMEN AND PELVIS W CONTRAST [phone]) (New Lymph node positive right breast cancer) NUCLEAR SCAN OF BONE (3D) [id number]) (new breast cancer, cT2N1, staging) CT CHEST W CON (89169) (new right breast cancer, lymph node positive cT2N1) Pt Education - ccs port insertion education CBC, PLATELETS & AUT DIFF (45038) METABOLIC PANEL, COMPREHENSIVE (88280)   Signed by Stark Klein, MD (07/13/2016 5:19 PM)

## 2016-07-14 ENCOUNTER — Other Ambulatory Visit (HOSPITAL_COMMUNITY): Payer: Self-pay | Admitting: General Surgery

## 2016-07-14 ENCOUNTER — Encounter (HOSPITAL_COMMUNITY): Payer: PRIVATE HEALTH INSURANCE | Attending: Hematology & Oncology | Admitting: Hematology & Oncology

## 2016-07-14 ENCOUNTER — Encounter (HOSPITAL_COMMUNITY): Payer: Self-pay | Admitting: Hematology & Oncology

## 2016-07-14 VITALS — BP 135/72 | HR 95 | Temp 98.1°F | Resp 18 | Ht 64.5 in | Wt 186.8 lb

## 2016-07-14 DIAGNOSIS — C50411 Malignant neoplasm of upper-outer quadrant of right female breast: Secondary | ICD-10-CM

## 2016-07-14 DIAGNOSIS — Z170 Estrogen receptor positive status [ER+]: Secondary | ICD-10-CM | POA: Diagnosis not present

## 2016-07-14 DIAGNOSIS — Z01818 Encounter for other preprocedural examination: Secondary | ICD-10-CM | POA: Insufficient documentation

## 2016-07-14 HISTORY — DX: Malignant neoplasm of upper-outer quadrant of right female breast: C50.411

## 2016-07-14 MED ORDER — PROCHLORPERAZINE MALEATE 10 MG PO TABS: 10.0000 mg | ORAL_TABLET | Freq: Four times a day (QID) | ORAL | 2 refills | Status: DC | PRN

## 2016-07-14 MED ORDER — ONDANSETRON HCL 8 MG PO TABS: 8.0000 mg | ORAL_TABLET | Freq: Three times a day (TID) | ORAL | 2 refills | Status: DC | PRN

## 2016-07-14 MED ORDER — LIDOCAINE-PRILOCAINE 2.5-2.5 % EX CREA: TOPICAL_CREAM | CUTANEOUS | 2 refills | Status: DC

## 2016-07-14 NOTE — Progress Notes (Signed)
Met with pt face to face.  Introduced myself and explain a little bit about my role as the patient navigator.  Pt given a card with all my information on it.  Told pt to call if they had any questions or concerns.  Pt verbalized understanding.

## 2016-07-14 NOTE — Patient Instructions (Signed)
[place]   CHEMOTHERAPY INSTRUCTIONS  You have been diagnosed with breast cancer.  We are going to treat you with adriamycin and cytoxan (AC) then taxol.  You will have 4 cycle (1 cycle is 2 weeks) of AC.  Then 12 weekly cycles of taxol.  This is with curative intent.  You will see the doctor regularly throughout treatment.  We monitor your lab work prior to every treatment.  The doctor monitors your response to treatment by the way you are feeling, your blood work, and scans periodically.   You will receive the following premedications prior to each chemotherapy treatment: Premeds: Aloxi - high powered nausea/vomiting prevention medication used for chemotherapy patients. Emend - high powered nausea/vomiting prevention medication used for chemotherapy patients. Dexamethasone - steroid - given to reduce the risk of you having an allergic type reaction to the chemotherapy. Dex can cause you to feel energized, nervous/anxious/jittery, make you have trouble sleeping, and/or make you feel hot/flushed in the face/neck and/or look pink/red in the face/neck. These side effects will pass as the Dex wears off. (takes 20 minutes to infuse)  Neulasta - this medication is not chemo but being given because you have had chemo. It is usually given 24-27 hours after the completion of chemotherapy. This medication works by boosting your bone marrow's supply of white blood cells. White blood cells are what protect our bodies against infection. The medication is given in the form of a subcutaneous injection. It is given in the fatty tissue of your abdomen. It is a short needle. The major side effect of this medication is bone or muscle pain. The drug of choice to relieve or lessen the pain is Aleve or Ibuprofen. If a physician has ever told you not to take Aleve or Ibuprofen - then don't take it. You should then take Tylenol/acetaminophen. Take either medication as the bottle directs you  to.  The level of pain you experience as a result of this injection can range from none, to mild or moderate, or severe. Please let us know if you develop moderate or severe bone pain.   You can take Claritin 10 mg over the counter for a few days after receiving neulasta to help with the bone aches and pains.    **DO NOT expose the Neulasta On-body injector to diagnostic imaging (CT scans, MRI, Ultrasound, X-ray), radiation treatment, or oxygen rich environments, such as hyperbaric chambers. **   POTENTIAL SIDE EFFECTS OF TREATMENT:  Cyclophosphamide (Generic Name) Other Names: Cytoxan, Neosar  About This Drug Cyclophosphamide is a drug used to treat cancer. It is given in the vein (IV) or by mouth.  Takes 30 minutes for this drug to infuse.  Possible Side Effects (More Common) . Nausea and throwing up (vomiting). These symptoms may happen within a few hours after your treatment and may last up to 72 hours. Medicines are available to stop or lessen these side effects. . Bone marrow depression. This is a decrease in the number of white blood cells, red blood cells, and platelets. This may raise your risk of infection, make you tired and weak (fatigue), and raise your risk of bleeding. . Hair loss: You may notice hair getting thin. Some patients lose their hair. Hair loss is often complete scalp hair loss and can involve loss of eyebrows, eyelashes, and pubic hair. You may notice this a few days or weeks after treatment has started. Most often hair loss is temporary; your hair should grow  back when treatment is done. . Decreased appetite (decreased hunger) . Blurred vision . Soreness of the mouth and throat. You may have red areas, white patches, or sores that hurt. . Effects on the bladder. This drug may cause irritation and bleeding in the bladder. You may have blood in your urine. To help stop this, you will get extra fluids to help you pass more urine. You may get a drug called mesna, which  helps to decrease irritation and bleeding. You may also get a medicine to help you pass more urine. You may have a catheter (tube) placed in your bladder so that your bladder will be washed with this drug.  Possible Side Effects (Less Common) . Darkening of the skin or nails . Metallic taste in the mouth . Changes in lung tissue may happen with large amounts of this drug. These changes may not last forever, and your lung tissue may go back to normal. Sometimes these changes may not be seen for many years. You may get a cough or have trouble catching your breath.  Allergic Reactions   Serious allergic reactions including anaphylaxis are rare. While you are getting this drug in your vein (IV), tell your nurse right away if you have any of these symptoms of an allergic reaction: . Trouble catching your breath . Feeling like your tongue or throat are swelling . Feeling your heart beat quickly or in a not normal way (palpitations) . Feeling dizzy or lightheaded . Flushing, itching, rash, and/or hives Treating Side Effects . Drink 6-8 cups of fluids each day unless your doctor has told you to limit your fluid intake due to some other health problem. A cup is 8 ounces of fluid. If you throw up or have loose bowel movements you should drink more fluids so that you do not become dehydrated (lack water in the body due to losing too much fluid). . Ask your doctor or nurse about medicine that is available to help stop or lessen nausea or throwing up. . Mouth care is very important. Your mouth care should consist of routine, gentle cleaning of your teeth or dentures and rinsing your mouth with a mixture of 1/2 teaspoon of salt in 8 ounces of water or  teaspoon of baking soda in 8 ounces of water. This should be done at least after each meal and at bedtime. . If you have mouth sores, avoid mouthwash that has alcohol. Also avoid alcohol and smoking because they can bother your mouth and throat. . Talk with  your nurse about getting a wig before you lose your hair. Also, call the [facility] at 800-ACS-2345 to find out information about the " Look Good.Marland KitchenMarland KitchenFeel Better" program close to where you live. It is a free program where women undergoing chemotherapy learn about wigs, turbans and scarves as well as makeup techniques and skin and nail care.  Important Information . Whenever you tell a doctor or nurse your health history, always tell them that you have received cyclophosphamide in the past. . If you take this drug by mouth swallow the medicine whole. Do not chew, break or crush it. . You can take the medicine with or without food. If you have nausea, take it with food. Do not take the pills at bedtime.  Food and Drug Interactions There are no known interactions of cyclophosphamide with food. This drug may interact with other medicines. Tell your doctor and pharmacist about all the medicines and dietary supplements (vitamins, minerals, herbs and  others) that you are taking at this time. The safety and use of dietary supplements and alternative diets are often not known. Using these might affect your cancer or interfere with your treatment. Until more is known, you should not use dietary supplements or alternative diets without your cancer doctor's help.  When to Call the Doctor Call your doctor or nurse right away if you have any of these symptoms: . Fever of 100.5 F (38 C) or higher . Chills . Bleeding or bruising that is not normal . Blurred vision or other changes in eyesight . Pain when passing urine; blood in urine . Pain in your lower back or side . Wheezing or trouble breathing . Swelling of legs, ankles, or feet . Feeling dizzy or lightheaded . Feeling confused or agitated . Signs of liver problems: dark urine, pale bowel movements, bad stomach pain, feeling very tired and weak, unusual itching, or yellowing of the eyes or skin . Unusual thirst or passing urine often .  Nausea that stops you from eating or drinking . Throwing up more than 3 times a day  Call your doctor or nurse as soon as possible if any of these symptoms happen: . Pain in your mouth or throat that makes it hard to eat or drink . Nausea not relieved by prescribed medicines  Sexual Problems and Reproductive Concerns . Infertility warning: Sexual problems and reproduction concerns may happen. In both men and women, this drug may affect your ability to have children. This cannot be determined before your treatment. Talk with your doctor or nurse if you plan to have children. Ask for information on sperm or egg banking. . In men, this drug may interfere with your ability to make sperm, but it should not change your ability to have sexual relations. . In women, menstrual bleeding may become irregular or stop while you are getting this drug. Do not assume that you cannot become pregnant if you do not have a menstrual period. . Women may go through signs of menopause (change of life) like vaginal dryness or itching. Vaginal lubricants can be used to lessen vaginal dryness, itching, and pain during sexual relations. . Genetic counseling is available for you to talk about the effects of this drug therapy on future pregnancies. Also, a genetic counselor can look at the possible risk of problems in the unborn baby due to this medicine if an exposure happens during pregnancy. . Pregnancy warning: This drug may have harmful effects on the unborn child, so effective methods of birth control should be used during your cancer treatment. . Breast feeding warning: Women should not breast feed during treatment because this drug could enter the breast milk and badly harm a breast feeding baby   Doxorubicin (Generic Name) Other Names: Adriamycin, hydroxyl daunorubicin  About This Drug Doxorubicin is a drug used to treat cancer. This drug is given in the vein (IV).  This drug is an IV push over about 5-10 minutes.     Possible Side Effects (More Common) . Bone marrow depression. This is a decrease in the number of white blood cells, red blood cells, and platelets. This may raise your risk of infection, make you tired and weak (fatigue), and raise your risk of bleeding. . Hair loss: Hair loss is often complete scalp hair loss and can involve loss of eyebrows, eyelashes, and pubic hair. You may notice this a few days or weeks after treatment has started. Most often hair loss is temporary; your hair should  grow back when treatment is done. . Nausea and throwing up (vomiting). These symptoms may happen within a few hours after your treatment and may last up to 24 hours. Medicines are available to stop or lessen these side effects. . Soreness of the mouth and throat. You may have red areas, white patches, or sores that hurt. . Change in the color of your urine to pink or red. This color change will go away in one to two days. . Effects on the heart: This drug can weaken the heart and lower heart function. Your heart function will be checked as needed. You may have trouble catching your breath, mainly during activities. You may also have trouble breathing while lying down, and have swelling in your ankles. . Sensitivity to light (photosensitivity). Photosensitivity means that you may become more sensitive to the effects of the sun, sun lamps, and tanning beds. Your eyes may water more, mostly in bright light. . Metallic taste in the mouth: This may change the taste of food and drinks . Decreased appetite (decreased hunger) . Darkening of the skin or nails . Weakness that interferes with your daily activities  Possible Side Effects (Less Common) . Skin and tissue irritation may involve redness, pain, warmth, or swelling at the IV site. This happens if the drug leaks out of the vein and into nearby tissue. . Changes in your liver function. Your doctor will check your liver function as needed. . This drug may cause an  increased risk of developing a second cancer  Allergic Reaction Serious allergic reactions, including anaphylaxis are rare. While you are getting this drug in your vein (IV), tell your nurse right away if you have any of these symptoms of an allergic reaction: . Trouble catching your breath . Feeling like your tongue or throat are swelling . Feeling your heart beat quickly or in a not normal way (palpitations) . Feeling dizzy or lightheaded . Flushing, itching, rash, and/or hives  Treating Side Effects . Drink 6-8 cups of fluids every day unless your doctor has told you to limit your fluid intake due to some other health problem. A cup is 8 ounces of fluid. If you vomit or have diarrhea, you should drink more fluids so that you do not become dehydrated (lack water in the body due to losing too much fluid). . Ask your doctor or nurse about medicine that is available to help stop or lessen nausea, throwing up, and/or loose bowel movements . Wear dark sunglasses and use sunscreen with SPF 30 or higher when you are outdoors even for a short time. Cover up when you are out in the sun. Wear wide-brimmed hats, long-sleeved shirts, and pants. Keep your neck, chest, and back covered. . Mouth care is very important. Your mouth care should consist of routine, gentle cleaning of your teeth or dentures and rinsing your mouth with a mixture of 1/2 teaspoon of salt in 8 ounces of water or  teaspoon of baking soda in 8 ounces of water. This should be done at least after each meal and at bedtime. . If you have mouth sores, avoid mouthwash that has alcohol. Avoid alcohol and smoking because they can bother your mouth and throat. . Talk with your nurse about getting a wig before you lose your hair. Also, call the [facility] at 800-ACS-2345 to find out information about the "Look Good, Feel Better" program close to where you live. It is a free program where women getting chemotherapy can learn  about wigs,  turbans and scarves as well as makeup techniques and skin and nail care. . While you are getting this drug, please tell your nurse right away if you have any pain, redness, or swelling at the site of the IV infusion.  Food and Drug Interactions There are no known interactions of doxorubicin with food. This drug may interact with other medicines. Tell your doctor and pharmacist about all the medicines and dietary supplements (vitamins, minerals, herbs and others) that you are taking at this time. The safety and use of dietary supplements and alternative diets are often not known. Using these might affect your cancer or interfere with your treatment. Until more is known, you should not use dietary supplements or alternative diets without your cancer doctor's help.  When to Call the Doctor Call your doctor or nurse right away if you have any of these symptoms: . Fever of 100.5 F (38 C) or above . Chills . Easy bruising or bleeding . Wheezing or trouble breathing . Rash or itching . Feeling dizzy or lightheaded . Feeling that your heart is beating in a fast or not normal way (palpitations) . Loose bowel movements (diarrhea) more than 4 times a day or diarrhea with weakness or feeling lightheaded . Nausea that stops you from eating or drinking . Throwing up more than 3 times a day . Signs of liver problems: dark urine, pale bowel movements, bad stomach pain, feeling very tired and weak, unusual itching, or yellowing of the eyes or skin, . During the IV infusion, if you have pain, redness, or swelling at the site of the IV infusion, please tell your nurse right away  Call your doctor or nurse as soon as possible if any of these symptoms happen: . Decreased urine . Pain in your mouth or throat that makes it hard to eat or drink . Nausea and throwing up that is not relieved by prescribed medicines . Rash that is not relieved by prescribed medicines . Swelling of legs, ankles, or feet . Weight  gain of 5 pounds in one week (fluid retention) . Lasting loss of appetite or rapid weight loss of five pounds in a week . Fatigue that interferes with your daily activities . Extreme weakness that interferes with normal activities  Sexual Problems and Reproduction Concerns . Infertility warning: Sexual problems and reproduction concerns may happen. In both men and women, this drug may affect your ability to have children. This cannot be determined before your treatment. Talk with your doctor or nurse if you plan to have children. Ask for information on sperm or egg banking. . In men, this drug may interfere with your ability to make sperm, but it should not change your ability to have sexual relations. . In women, menstrual bleeding may become irregular or stop while you are getting this drug. Do not assume that you cannot become pregnant if you do not have a menstrual period.   Paclitaxel (Taxol)  About This Drug Paclitaxel is a drug used to treat cancer. It is given in the vein (IV).  Possible Side Effects . Hair loss. Hair loss is often temporary, although with certain medicine, hair loss can sometimes be permanent. Hair loss may happen suddenly or gradually. If you lose hair, you may lose it from your head, face, armpits, pubic area, chest, and/or legs. You may also notice your hair getting thin. . Swelling of your legs, ankles and/or feet (edema) . Flushing . Nausea and throwing up (vomiting) . Loose  bowel movements (diarrhea) . Bone marrow depression. This is a decrease in the number of white blood cells, red blood cells, and platelets. This may raise your risk of infection, make you tired and weak (fatigue), and raise your risk of bleeding. . Effects on the nerves are called peripheral neuropathy. You may feel numbness, tingling, or pain in your hands and feet. It may be hard for you to button your clothes, open jars, or walk as usual. The effect on the nerves may get worse with more  doses of the drug. These effects get better in some people after the drug is stopped but it does not get better in all people. . Changes in your liver function . Bone, joint and muscle pain . Abnormal EKG . Allergic reaction: Allergic reactions, including anaphylaxis are rare but may happen in some patients. Signs of allergic reaction to this drug may be swelling of the face, feeling like your tongue or throat are swelling, trouble breathing, rash, itching, fever, chills, feeling dizzy, and/or feeling that your heart is beating in a fast or not normal way. If this happens, do not take another dose of this drug. You should get urgent medical treatment. . Infection . Changes in your kidney function. Note: Each of the side effects above was reported in 20% or greater of patients treated with paclitaxel. Not all possible side effects are included above.  Warnings and Precautions . Severe allergic reactions . Severe bone marrow depression  Treating Side Effects . To help with hair loss, wash with a mild shampoo and avoid washing your hair every day. . Avoid rubbing your scalp, instead, pat your hair or scalp dry . Avoid coloring your hair . Limit your use of hair spray, electric curlers, blow dryers, and curling irons. . If you are interested in getting a wig, talk to your nurse. You can also call the [facility] at 800-ACS-2345 to find out information about the "Look Good, Feel Better" program close to where you live. It is a free program where women getting chemotherapy can learn about wigs, turbans and scarves as well as makeup techniques and skin and nail care. . Ask your doctor or nurse about medicines that are available to help stop or lessen diarrhea and/or nausea. . To help with nausea and vomiting, eat small, frequent meals instead of three large meals a day. Choose foods and drinks that are at room temperature. Ask your nurse or doctor about other helpful tips and medicine that  is available to help or stop lessen these symptoms. . If you get diarrhea, eat low-fiber foods that are high in protein and calories and avoid foods that can irritate your digestive tracts or lead to cramping. Ask your nurse or doctor about medicine that can lessen or stop your diarrhea. . Mouth care is very important. Your mouth care should consist of routine, gentle cleaning of your teeth or dentures and rinsing your mouth with a mixture of 1/2 teaspoon of salt in 8 ounces of water or  teaspoon of baking soda in 8 ounces of water. This should be done at least after each meal and at bedtime. . If you have mouth sores, avoid mouthwash that has alcohol. Also avoid alcohol and smoking because they can bother your mouth and throat. . Drink plenty of fluids (a minimum of eight glasses per day is recommended). . Take your temperature as your doctor or nurse tells you, and whenever you feel like you may have a fever. [name]  Talk to your doctor or nurse about precautions you can take to avoid infections and bleeding. . Be careful when cooking, walking, and handling sharp objects and hot liquids.  Food and Drug Interactions . There are no known interactions of paclitaxel with food. . This drug may interact with other medicines. Tell your doctor and pharmacist about all the medicines and dietary supplements (vitamins, minerals, herbs and others) that you are taking at this time. . The safety and use of dietary supplements and alternative diets are often not known. Using these might affect your cancer or interfere with your treatment. Until more is known, you should not use dietary supplements or alternative diets without your cancer doctor's help.  When to Call the Doctor Call your doctor or nurse if you have any of the following symptoms and/or any new or unusual symptoms: . Fever of 100.5 F (38 C) or above . Chills . Redness, pain, warmth, or swelling at the IV site during the infusion . Signs of allergic  reaction: swelling of the face, feeling like your tongue or throat are swelling, trouble breathing, rash, itching, fever, chills, feeling dizzy, and/or feeling that your heart is beating in a fast or not normal way . Feeling that your heart is beating in a fast or not normal way (palpitations) . Weight gain of 5 pounds in one week (fluid retention) . Decreased urine or very dark urine . Signs of liver problems: dark urine, pale bowel movements, bad stomach pain, feeling very tired and weak, unusual itching, or yellowing of the eyes or skin . Heavy menstrual period that lasts longer than normal . Easy bruising or bleeding . Nausea that stops you from eating or drinking, and/or that is not relieved by prescribed medicines. . Loose bowel movements (diarrhea) more than 4 times a day or diarrhea with weakness or lightheadedness . Pain in your mouth or throat that makes it hard to eat or drink . Lasting loss of appetite or rapid weight loss of five pounds in a week . Signs of peripheral neuropathy: numbness, tingling, or decreased feeling in fingers or toes; trouble walking or changes in the way you walk; or feeling clumsy when buttoning clothes, opening jars, or other routine activities . Joint and muscle pain that is not relieved by prescribed medicines . Extreme fatigue that interferes with normal activities . While you are getting this drug, please tell your nurse right away if you have any pain, redness, or swelling at the site of the IV infusion. . If you think you are pregnant.  Reproduction Warnings . Pregnancy warning: This drug may have harmful effects on the unborn child, it is recommended that effective methods of birth control should be used during your cancer treatment. Let your doctor know right away if you think you may be pregnant. . Breast feeding warning: Women should not breast feed during treatment because this drug could enter the breast milk and cause harm to a breast feeding  baby.    EDUCATIONAL MATERIALS GIVEN AND REVIEWED: Chemotherapy and you book given.  Nutrition book given.  Information on adriamycin, cytoxan, and taxol.   SELF CARE ACTIVITIES WHILE ON CHEMOTHERAPY: Hydration Increase your fluid intake 48 hours prior to treatment and drink at least 8 to 12 cups (64 ounces) of water/decaff beverages per day after treatment. You can still have your cup of coffee or soda but these beverages do not count as part of your 8 to 12 cups that you need to drink daily. No  alcohol intake.  Medications Continue taking your normal prescription medication as prescribed.  If you start any new herbal or new supplements please let us know first to make sure it is safe.  Mouth Care Have teeth cleaned professionally before starting treatment. Keep dentures and partial plates clean. Use soft toothbrush and do not use mouthwashes that contain alcohol. Biotene is a good mouthwash that is available at most pharmacies or may be ordered by calling [phone]. Use warm salt water gargles (1 teaspoon salt per 1 quart warm water) before and after meals and at bedtime. Or you may rinse with 2 tablespoons of three-percent hydrogen peroxide mixed in eight ounces of water. If you are still having problems with your mouth or sores in your mouth please call the clinic. If you need dental work, please let Dr. Whitney Muse know before you go for your appointment so that we can coordinate the best possible time for you in regards to your chemo regimen. You need to also let your dentist know that you are actively taking chemo. We may need to do labs prior to your dental appointment.   Skin Care Always use sunscreen that has not expired and with SPF (Sun Protection Factor) of 50 or higher. Wear hats to protect your head from the sun. Remember to use sunscreen on your hands, ears, face, & feet.  Use good moisturizing lotions such as udder cream, eucerin, or even Vaseline. Some chemotherapies can cause  dry skin, color changes in your skin and nails.    . Avoid long, hot showers or baths. . Use gentle, fragrance-free soaps and laundry detergent. . Use moisturizers, preferably creams or ointments rather than lotions because the thicker consistency is better at preventing skin dehydration. Apply the cream or ointment within 15 minutes of showering. Reapply moisturizer at night, and moisturize your hands every time after you wash them.  Hair Loss (if your doctor says your hair will fall out)  . If your doctor says that your hair is likely to fall out, decide before you begin chemo whether you want to wear a wig. You may want to shop before treatment to match your hair color. . Hats, turbans, and scarves can also camouflage hair loss, although some people prefer to leave their heads uncovered. If you go bare-headed outdoors, be sure to use sunscreen on your scalp. . Cut your hair short. It eases the inconvenience of shedding lots of hair, but it also can reduce the emotional impact of watching your hair fall out. . Don't perm or color your hair during chemotherapy. Those chemical treatments are already damaging to hair and can enhance hair loss. Once your chemo treatments are done and your hair has grown back, it's OK to resume dyeing or perming hair. With chemotherapy, hair loss is almost always temporary. But when it grows back, it may be a different color or texture. In older adults who still had hair color before chemotherapy, the new growth may be completely gray.  Often, new hair is very fine and soft.  Infection Prevention Please wash your hands for at least 30 seconds using warm soapy water. Handwashing is the #1 way to prevent the spread of germs. Stay away from sick people or people who are getting over a cold. If you develop respiratory systems such as green/yellow mucus production or productive cough or persistent cough let us know and we will see if you need an antibiotic. It is a good idea  to keep a pair of  gloves on when going into grocery stores/[employer] to decrease your risk of coming into contact with germs on the carts, etc. Carry alcohol hand gel with you at all times and use it frequently if out in public. If your temperature reaches 100.5 or higher please call the clinic and let us know.  If it is after hours or on the weekend please go to the ER if your temperature is over 100.5.  Please have your own personal thermometer at home to use.    Sex and bodily fluids If you are going to have sex, a condom must be used to protect the person that isn't taking chemotherapy. Chemo can decrease your libido (sex drive). For a few days after chemotherapy, chemotherapy can be excreted through your bodily fluids.  When using the toilet please close the lid and flush the toilet twice.  Do this for a few day after you have had chemotherapy.     Effects of chemotherapy on your sex life Some changes are simple and won't last long. They won't affect your sex life permanently. Sometimes you may feel: . too tired . not strong enough to be very active . sick or sore  . not in the mood . anxious or low Your anxiety might not seem related to sex. For example, you may be worried about the cancer and how your treatment is going. Or you may be worried about money, or about how you family are coping with your illness. These things can cause stress, which can affect your interest in sex. It's important to talk to your partner about how you feel. Remember - the changes to your sex life don't usually last long. There's usually no medical reason to stop having sex during chemo. The drugs won't have any long term physical effects on your performance or enjoyment of sex. Cancer can't be passed on to your partner during sex  Contraception It's important to use reliable contraception during treatment. Avoid getting pregnant while you or your partner are having chemotherapy. This is because the drugs may harm  the baby. Sometimes chemotherapy drugs can leave a man or woman infertile.  This means you would not be able to have children in the future. You might want to talk to someone about permanent infertility. It can be very difficult to learn that you may no longer be able to have children. Some people find counselling helpful. There might be ways to preserve your fertility, although this is easier for men than for women. You may want to speak to a fertility expert. You can talk about sperm banking or harvesting your eggs. You can also ask about other fertility options, such as donor eggs. If you have or have had breast cancer, your doctor might advise you not to take the contraceptive pill. This is because the hormones in it might affect the cancer.  It is not known for sure whether or not chemotherapy drugs can be passed on through semen or secretions from the vagina. Because of this some doctors advise people to use a barrier method if you have sex during treatment. This applies to vaginal, anal or oral sex. Generally, doctors advise a barrier method only for the time you are actually having the treatment and for about a week after your treatment. Advice like this can be worrying, but this does not mean that you have to avoid being intimate with your partner. You can still have close contact with your partner and continue to enjoy sex.  Animals If you have cats or birds we just ask that you not change the litter or change the cage.  Please have someone else do this for you while you are on chemotherapy.   Food Safety During and After Cancer Treatment Food safety is important for people both during and after cancer treatment. Cancer and cancer treatments, such as chemotherapy, radiation therapy, and stem cell/bone marrow transplantation, often weaken the immune system. This makes it harder for your body to protect itself from foodborne illness, also called food poisoning. Foodborne illness is caused by  eating food that contains harmful bacteria, parasites, or viruses.  Foods to avoid Some foods have a higher risk of becoming tainted with bacteria. These include: [name] Unwashed fresh fruit and vegetables, especially leafy vegetables that can hide dirt and other contaminants . Raw sprouts, such as alfalfa sprouts . Raw or undercooked beef, especially ground beef, or other raw or undercooked meat and poultry . Fatty, fried, or spicy foods immediately before or after treatment.  These can sit heavy on your stomach and make you feel nauseous. . Raw or undercooked shellfish, such as oysters. . Sushi and sashimi, which often contain raw fish.  . Unpasteurized beverages, such as unpasteurized fruit juices, raw milk, raw yogurt, or cider . Undercooked eggs, such as soft boiled, over easy, and poached; raw, unpasteurized eggs; or foods made with raw egg, such as homemade raw cookie dough and homemade mayonnaise Simple steps for food safety Shop smart. . Do not buy food stored or displayed in an unclean area. . Do not buy bruised or damaged fruits or vegetables. . Do not buy cans that have cracks, dents, or bulges. . Pick up foods that can spoil at the end of your shopping trip and store them in a cooler on the way home. Prepare and clean up foods carefully. . Rinse all fresh fruits and vegetables under running water, and dry them with a clean towel or paper towel. . Clean the top of cans before opening them. . After preparing food, wash your hands for 20 seconds with hot water and soap. Pay special attention to areas between fingers and under nails. . Clean your utensils and dishes with hot water and soap. [name] Disinfect your kitchen and cutting boards using 1 teaspoon of liquid, unscented bleach mixed into 1 quart of water.   Dispose of old food. . Eat canned and packaged food before its expiration date (the "use by" or "best before" date). . Consume refrigerated leftovers within 3 to 4 days. After that  time, throw out the food. Even if the food does not smell or look spoiled, it still may be unsafe. Some bacteria, such as Listeria, can grow even on foods stored in the refrigerator if they are kept for too long. Take precautions when eating out. . At restaurants, avoid buffets and salad bars where food sits out for a long time and comes in contact with many people. Food can become contaminated when someone with a virus, often a norovirus, or another "bug" handles it. . Put any leftover food in a "to-go" container yourself, rather than having the server do it. And, refrigerate leftovers as soon as you get home. . Choose restaurants that are clean and that are willing to prepare your food as you order it cooked.     MEDICATIONS: Dexamethasone 4mg  tablet. Take 2 tablets by mouth once a day on the day after chemotherapy and then take 2 tablets two times a day for 2  days. Take with food.                                                                                                               Zofran/Ondansetron 8mg  tablet. Take 1 tablet every 8 hours as needed for nausea/vomiting. (#1 nausea med to take, this can constipate)  Compazine/Prochlorperazine 10mg  tablet. Take 1 tablet every 6 hours as needed for nausea/vomiting. (#2 nausea med to take, this can make you sleepy)   EMLA cream. Apply a quarter size amount to port site 1 hour prior to chemo. Do not rub in. Cover with plastic wrap.   Over-the-Counter Meds:  Miralax 1 capful in 8 oz of fluid daily. May increase to two times a day if needed. This is a stool softener. If this doesn't work proceed you can add:  Senokot S-start with 1 tablet two times a day and increase to 4 tablets two times a day if needed. (total of 8 tablets in a 24 hour period). This is a stimulant laxative.   Call us if this does not help your bowels move.   Imodium 2mg  capsule. Take 2 capsules after the 1st loose stool and then 1 capsule every 2 hours until you go  a total of 12 hours without having a loose stool. Call the [facility] if loose stools continue. If diarrhea occurs @ bedtime, take 2 capsules @ bedtime. Then take 2 capsules every 4 hours until morning. Call [facility].     Diarrhea Sheet  If you are having loose stools/diarrhea, please purchase Imodium and begin taking as outlined:  At the first sign of poorly formed or loose stools you should begin taking Imodium(loperamide) 2 mg capsules.  Take two caplets (4mg ) followed by one caplet (2mg ) every 2 hours until you have had no diarrhea for 12 hours.  During the night take two caplets (4mg ) at bedtime and continue every 4 hours during the night until the morning.  Stop taking Imodium only after there is no sign of diarrhea for 12 hours.    Always call the [facility] if you are having loose stools/diarrhea that you can't get under control.  Loose stools/disrrhea leads to dehydration (loss of water) in your body.  We have other options of trying to get the loose stools/diarrhea to stopped but you must let us know!     Constipation Sheet *Miralax in 8 oz of fluid daily.  May increase to two times a day if needed.  This is a stool softener.  If this not enough to keep your bowel regular:  You can add:  *Senokot S, start with one tablet twice a day and can increase to 4 tablets twice a day if needed.  This is a stimulant laxative.   Sometimes when you take pain medication you need BOTH a medicine to keep your stool soft and a medicine to help your bowel push it out!  Please call if the above does not work for you.   Do not go more than 2 days without  a bowel movement.  It is very important that you do not become constipated.  It will make you feel sick to your stomach (nausea) and can cause abdominal pain and vomiting.      Nausea Sheet  Zofran/Ondansetron 8mg  tablet. Take 1 tablet every 8 hours as needed for nausea/vomiting. (#1 nausea med to take, this can  constipate)  Compazine/Prochlorperazine 10mg  tablet. Take 1 tablet every 6 hours as needed for nausea/vomiting. (#2 nausea med to take, this can make you sleepy)  You can take these medications together or separately.  We would first like for you to try the Ondansetron by itself and then take the Prochloperizine if needed. But you are allowed to take both medications at the same time if your nausea is that severe.  If you are having persistent nausea (nausea that does not stop) please take these medications on a staggered schedule so that the nausea medication stays in your body.  Please call the [facility] and let us know the amount of nausea that you are experiencing.  If you begin to vomit, you need to call the [facility] and if it is the weekend and you have vomited more than one time and cant get it to stop-go to the Emergency Room.  Persistent nausea/vomiting can lead to dehydration (loss of fluid in your body) and will make you feel terrible.   Ice chips, sips of clear liquids, foods that are @ room temperature, crackers, and toast tend to be better tolerated.     SYMPTOMS TO REPORT AS SOON AS POSSIBLE AFTER TREATMENT:  FEVER GREATER THAN 100.5 F  CHILLS WITH OR WITHOUT FEVER  NAUSEA AND VOMITING THAT IS NOT CONTROLLED WITH YOUR NAUSEA MEDICATION  UNUSUAL SHORTNESS OF BREATH  UNUSUAL BRUISING OR BLEEDING  TENDERNESS IN MOUTH AND THROAT WITH OR WITHOUT PRESENCE OF ULCERS  URINARY PROBLEMS  BOWEL PROBLEMS  UNUSUAL RASH     Wear comfortable clothing and clothing appropriate for easy access to any Portacath or PICC line. Let us know if there is anything that we can do to make your therapy better!     What to do if you need assistance after hours or on the weekends: CALL [phone].  HOLD on the line, do not hang up.  You will hear multiple messages but at the end you will be connected with a nurse triage line.  They will contact Dr Whitney Muse if necessary.  Most of the  time they will be able to assist you.   Do not call the hospital operator.  Dr Whitney Muse will not answer phone calls received by them.       I have been informed and understand all of the instructions given to me and have received a copy. I have been instructed to call the clinic [phone] or my family physician as soon as possible for continued medical care, if indicated. I do not have any more questions at this time but understand that I may call the [facility] or the Patient Navigator at [phone] during office hours should I have questions or need assistance in obtaining follow-up care.

## 2016-07-14 NOTE — Progress Notes (Signed)
[name]  CONSULT NOTE  Patient Care Team: Monico Blitz, MD as PCP - General (Internal Medicine)  CHIEF COMPLAINTS/PURPOSE OF CONSULTATION:   clinical T2 N1 M0 Stage 2b, ER 80% positive, PR negative, HER-2 negative, Ki-67 90% of right breast.   Breast cancer of upper-outer quadrant of right female breast ([place])   06/24/2016 Mammogram    Diagnostic bilateral mammogram: highly suspicious 3.5 cm mass in the UO R breast with marked enlarged R axillary LN      07/01/2016 Pathology Results    Infiltrating ductal carcinoma ER+ 80%, PR-, HER 2-, Ki67 90%      07/01/2016 Initial Biopsy    Ultrasound guided R breast biopsy and R axillary LN biopsy both were clipped post biopsy      07/14/2016 Initial Diagnosis    Breast cancer of upper-outer quadrant of right female breast ([place])      HISTORY OF PRESENTING ILLNESS:  [name] 61 y.o. female is here because of referral by Dr. Barry Dienes for consultation regarding a new diagnosis of right breast cancer and to discuss treatment recommendations.    As per Dr. Marlowe Aschoff note on 07/13/16  " The patient presented with palpable mass in her right breast. She's not sure how long it has been there. She was also found to have a mass in her axilla. Diagnostic imaging was performed which demonstrated a 3.5 cm mass at 10:00 6 cm from the right nipple. She also was found to have a enlarged lymph node on imaging. Both of these were biopsied percutaneously. Of note, the axillary lymph node was also clipped after biopsy. 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 Prognostic panel is ER positive, PR negative, HER-2/neu not overexpressed. Ki-67 is 90%."  In Dr Marlowe Aschoff note from 07/13/16 she mentioned that she is going to place a port next week. Dr Barry Dienes noted "if we can downstage her axilla, we may be able to do a targeted sentinel node biopspy instead of an ALND."  The patient is here for further evaluation and discussion of  newly diagnosed right breast cancer and treatment recommendations .  Marshal [name] is accompanied by her sister.   States she has receives mammograms every year in [place]. She states her last mammogram was on 06/26/16. She had one the year prior.She felt a lump in her right breast  in the 2nd week in December. A couple days later she found a lump on her right axillae.[name] She was laying in the bed at that time she felt the one on her right breast. She notes that this was before she was due for mammography.   She has had a colonoscopy.   She feels well. She thinks that she is handling the "news" fairly well. She has a good support system.   Patient has no further questions at this time, but presents to discuss additional recommendations regarding therapy for her newly diagnosed breast cancer.    MEDICAL HISTORY:  Past Medical History:  Diagnosis Date  . Diabetes mellitus without complication   . Hypertension   . Ruptured eardrum    left     SURGICAL HISTORY: No past surgical history on file.  SOCIAL HISTORY: Social History   Social History  . Marital status: Single    Spouse name: N/A  . Number of children: N/A  . Years of education: N/A   Occupational History  . Not on file.   Social History Main Topics  . Smoking status:  Former Smoker  . Smokeless tobacco: Not on file  . Alcohol use No  . Drug use: No  . Sexual activity: Not on file   Other Topics Concern  . Not on file   Social History Narrative  . No narrative on file   She is single She doesn't smoke, no alcohol use.  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 Loves flowers and doing yard work. She loves games on her tablet.   FAMILY HISTORY: No family history on file. 2 sisters 5 brothers No  family history of breast cancer or prostate cancer  Sister has history of colon cancer- she had 2 hernias removed Mom- 13 deceased, was on  dialysis and heart got weaker and weaker.  Dad- 71 deceased, - was a heavy drinker   Has 2 boys, 53 and 66 years of age 72 grandson- 61 months old   ALLERGIES:  has No Known Allergies.  MEDICATIONS:  Current Outpatient Prescriptions  Medication Sig Dispense Refill  . amLODipine (NORVASC) 10 MG tablet Take 10 mg by mouth daily.    [name] atorvastatin (LIPITOR) 10 MG tablet Take 10 mg by mouth every evening.  4  . fexofenadine (ALLEGRA) 180 MG tablet Take 180 mg by mouth daily.  3  . glyBURIDE-metformin (GLUCOVANCE) 5-500 MG tablet Take 1 tablet by mouth 2 (two) times daily.  4  . hydrochlorothiazide (HYDRODIURIL) 25 MG tablet Take 25 mg by mouth daily.    [name] ibuprofen (ADVIL,MOTRIN) 800 MG tablet Take 800 mg by mouth every 8 (eight) hours as needed.  0  . lisinopril (PRINIVIL,ZESTRIL) 40 MG tablet Take 40 mg by mouth daily.    . mometasone (NASONEX) 50 MCG/ACT nasal spray INHALE 1 SPRAY IN EACH NOSTRIL DAILY  2  . traMADol (ULTRAM) 50 MG tablet Take 1 tablet (50 mg total) by mouth every 6 (six) hours as needed. 15 tablet 0   No current facility-administered medications for this visit.     Review of Systems  Constitutional: Negative.   HENT: Negative.   Eyes: Negative.   Respiratory: Negative.   Cardiovascular: Negative.   Gastrointestinal: Negative.   Genitourinary: Negative.   Musculoskeletal: Negative.   Skin: Negative.   Neurological: Negative.   Endo/Heme/Allergies: Negative.   Psychiatric/Behavioral: Negative.   All other systems reviewed and are negative. 14 point ROS was done and is otherwise as detailed above or in HPI   PHYSICAL EXAMINATION: ECOG PERFORMANCE STATUS: 0 - Asymptomatic  Vitals:   07/14/16 1547  BP: 135/72  Pulse: 95  Resp: 18  Temp: 98.1 F (36.7 C)   Filed Weights   07/14/16 1547  Weight: 186 lb 12.8 oz (84.7 kg)    Physical Exam  Constitutional: She is oriented to person, place, and time and well-developed, well-nourished, and in no distress.   HENT:  Head: Normocephalic and atraumatic.  Mouth/Throat: No oropharyngeal exudate.  Eyes: Conjunctivae and EOM are normal. Pupils are equal, round, and reactive to light. No scleral icterus.  Neck: Normal range of motion. Neck supple.  Cardiovascular: Normal rate, regular rhythm and normal heart sounds.   Pulmonary/Chest: Effort normal and breath sounds normal.    Abdominal: Soft. Bowel sounds are normal. She exhibits no distension and no mass. There is no tenderness. There is no rebound and no guarding.  Musculoskeletal: Normal range of motion.  Lymphadenopathy:    She has no cervical adenopathy.  Neurological: She is  alert and oriented to person, place, and time. Gait normal.  Skin: Skin is warm and dry.  Psychiatric: Mood, memory, affect and judgment normal.  Nursing note and vitals reviewed.    LABORATORY DATA:  I have reviewed the data as listed No results found for: WBC, HGB, HCT, MCV, PLT CMP  No results found for: NA, K, CL, CO2, GLUCOSE, BUN, CREATININE, CALCIUM, PROT, ALBUMIN, AST, ALT, ALKPHOS, BILITOT, GFRNONAA, GFRAA   RADIOGRAPHIC STUDIES: I have personally reviewed the radiological images as listed and agreed with the findings in the report.  No results found.  PATHOLOGY:         ASSESSMENT & PLAN:   Clinical T2 N1 M0 Stage 2b  ER 80% positive, PR negative, HER-2 negative, Ki-67 90% of right breast.  Pathology reviewed. Results noted above. We discussed breast cancer in a general sense and I explained ER, PR and HER 2. We discussed the different types of breast cancer. I explained the role of chemotherapy prior to surgery ie. Neoadjuvant, and the role of endocrine therapy and radiation therapy. She was provided with several sources of educational material.  She is clinical T2 N1 M0 Stage IIb ER 80% positive, PR negative, HER-2 negative, Ki-67 90%. We would offer her neoadjuvant chemotherapy with dose dense AC followed by 12 week of  taxol.   She is  scheduled for a CT chest, abdomen, and pelvis with contrast on 07/21/2016, ordered by Dr. Barry Dienes. Bone scan is also ordered.  Dr. Barry Dienes plans to place a port next week.   She met Anderson Malta today our navigator. We will arrange for chemotherapy teaching. She will need a MUGA or ECHO prior to adriamycin.  I have encouraged her to write down all questions and bring them with her to follow-up.   ORDERS PLACED FOR THIS ENCOUNTER: No orders of the defined types were placed in this encounter.  Orders Placed This Encounter  Procedures  . NM Cardiac [place] Rest    Standing Status:   Future    Number of Occurrences:   1    Standing Expiration Date:   07/14/2017    Order Specific Question:   Reason for Exam (SYMPTOM  OR DIAGNOSIS REQUIRED)    Answer:   chemotherapy    Order Specific Question:   Is the patient pregnant?    Answer:   No    Order Specific Question:   Preferred imaging location?    Answer:   [facility]    Order Specific Question:   If indicated for the ordered procedure, I authorize the administration of a radiopharmaceutical per Radiology protocol    Answer:   Yes  . CBC with Differential    Standing Status:   Future    Number of Occurrences:   1    Standing Expiration Date:   07/14/2017  . Comprehensive metabolic panel    Standing Status:   Future    Number of Occurrences:   1    Standing Expiration Date:   07/14/2017    This document serves as a record of services personally performed by Ancil Linsey, MD. It was created on her behalf by Shirlean Mylar, a trained medical scribe. The creation of this record is based on the scribe's personal observations and the provider's statements to them. This document has been checked and approved by the attending provider.  I have reviewed the above documentation for accuracy and completeness and I agree with the above.  This note was electronically signed.  Molli Hazard, MD  07/14/2016 4:11 PM

## 2016-07-14 NOTE — Patient Instructions (Signed)
[place] at [facility] Discharge Instructions  RECOMMENDATIONS MADE BY THE CONSULTANT AND ANY TEST RESULTS WILL BE SENT TO YOUR REFERRING PHYSICIAN.  You were seen today by Dr. Whitney Muse. Return tomorrow for chemo teaching and labs. Return 1 week post chemo for follow up.  Thank you for choosing [place] at [facility] to provide your oncology and hematology care.  To afford each patient quality time with our provider, please arrive at least 15 minutes before your scheduled appointment time.    If you have a lab appointment with the [facility] please come in thru the  Main Entrance and check in at the main information desk  You need to re-schedule your appointment should you arrive 10 or more minutes late.  We strive to give you quality time with our providers, and arriving late affects you and other patients whose appointments are after yours.  Also, if you no show three or more times for appointments you may be dismissed from the clinic at the providers discretion.     Again, thank you for choosing [facility].  Our hope is that these requests will decrease the amount of time that you wait before being seen by our physicians.       _____________________________________________________________  Should you have questions after your visit to [facility], please contact our office at [phone] between the hours of 8:30 a.m. and 4:30 p.m.  Voicemails left after 4:30 p.m. will not be returned until the following business day.  For prescription refill requests, have your pharmacy contact our office.       Resources For Cancer Patients and their Caregivers ? American Cancer Society: Can assist with transportation, wigs, general needs, runs Look Good Feel Better.        (586)602-1808 ? Cancer Care: Provides financial assistance, online support groups, medication/co-pay assistance.  1-800-813-HOPE  (772) 781-6550) ? [employer] Assists [place] cancer patients and their families through emotional , educational and financial support.  (858)406-2844 ? [place] DSS Where to apply for food stamps, Medicaid and utility assistance. 641-315-6476 ? [employer]: Transportation to medical appointments. 502 879 5993 ? Social Security Administration: May apply for disability if have a Stage IV cancer. 365-652-9946 (610) 037-1493 ? [employer], Disability and Transit Services: Assists with nutrition, care and transit needs. [place] Support Programs: @10RELATIVEDAYS @ > Cancer Support Group  2nd Tuesday of the month 1pm-2pm, Journey Room  > Creative Journey  3rd Tuesday of the month 1130am-1pm, Journey Room  > Look Good Feel Better  1st Wednesday of the month 10am-12 noon, Journey Room (Call [place] to register [phone])

## 2016-07-15 ENCOUNTER — Encounter (HOSPITAL_COMMUNITY): Payer: PRIVATE HEALTH INSURANCE

## 2016-07-15 DIAGNOSIS — Z170 Estrogen receptor positive status [ER+]: Principal | ICD-10-CM

## 2016-07-15 DIAGNOSIS — C50411 Malignant neoplasm of upper-outer quadrant of right female breast: Secondary | ICD-10-CM

## 2016-07-15 DIAGNOSIS — Z01818 Encounter for other preprocedural examination: Secondary | ICD-10-CM

## 2016-07-15 LAB — COMPREHENSIVE METABOLIC PANEL
ALT: 19 U/L (ref 14–54)
ANION GAP: 8 (ref 5–15)
AST: 20 U/L (ref 15–41)
Albumin: 3.7 g/dL (ref 3.5–5.0)
Alkaline Phosphatase: 89 U/L (ref 38–126)
BUN: 19 mg/dL (ref 6–20)
CALCIUM: 9.7 mg/dL (ref 8.9–10.3)
CHLORIDE: 102 mmol/L (ref 101–111)
CO2: 28 mmol/L (ref 22–32)
Creatinine, Ser: 0.99 mg/dL (ref 0.44–1.00)
GFR calc non Af Amer: >60 mL/min (ref >60)
Glucose, Bld: 95 mg/dL (ref 65–99)
Potassium: 3 mmol/L — ABNORMAL LOW (ref 3.5–5.1)
SODIUM: 138 mmol/L (ref 135–145)
Total Bilirubin: 0.5 mg/dL (ref 0.3–1.2)
Total Protein: 7.7 g/dL (ref 6.5–8.1)

## 2016-07-15 LAB — CBC WITH DIFFERENTIAL/PLATELET
Basophils Absolute: 0 10*3/uL (ref 0.0–0.1)
Basophils Relative: 0 %
EOS ABS: 0.3 10*3/uL (ref 0.0–0.7)
EOS PCT: 3 %
HCT: 38.3 % (ref 36.0–46.0)
Hemoglobin: 13.5 g/dL (ref 12.0–15.0)
LYMPHS ABS: 3.4 10*3/uL (ref 0.7–4.0)
Lymphocytes Relative: 35 %
MCH: 29.7 pg (ref 26.0–34.0)
MCHC: 35.2 g/dL (ref 30.0–36.0)
MCV: 84.2 fL (ref 78.0–100.0)
MONOS PCT: 7 %
Monocytes Absolute: 0.6 10*3/uL (ref 0.1–1.0)
Neutro Abs: 5.4 10*3/uL (ref 1.7–7.7)
Neutrophils Relative %: 55 %
Platelets: 258 10*3/uL (ref 150–400)
RBC: 4.55 MIL/uL (ref 3.87–5.11)
RDW: 12.6 % (ref 11.5–15.5)
WBC: 9.7 10*3/uL (ref 4.0–10.5)

## 2016-07-16 ENCOUNTER — Encounter (HOSPITAL_COMMUNITY): Payer: Self-pay | Admitting: Emergency Medicine

## 2016-07-16 NOTE — Progress Notes (Signed)
Chemotherapy teaching pulled together and appts made. 

## 2016-07-20 ENCOUNTER — Encounter (HOSPITAL_COMMUNITY)
Admission: RE | Admit: 2016-07-20 | Discharge: 2016-07-20 | Disposition: A | Discharge status: Home / Self Care | Payer: No Typology Code available for payment source | Source: Ambulatory Visit | Attending: Hematology & Oncology | Admitting: Hematology & Oncology

## 2016-07-20 ENCOUNTER — Encounter: Payer: Self-pay | Admitting: Radiation Oncology

## 2016-07-20 ENCOUNTER — Encounter (HOSPITAL_COMMUNITY): Payer: Self-pay | Admitting: *Deleted

## 2016-07-20 ENCOUNTER — Encounter (HOSPITAL_COMMUNITY): Payer: Self-pay

## 2016-07-20 DIAGNOSIS — C50411 Malignant neoplasm of upper-outer quadrant of right female breast: Secondary | ICD-10-CM | POA: Insufficient documentation

## 2016-07-20 DIAGNOSIS — Z170 Estrogen receptor positive status [ER+]: Secondary | ICD-10-CM | POA: Insufficient documentation

## 2016-07-20 DIAGNOSIS — Z01818 Encounter for other preprocedural examination: Secondary | ICD-10-CM | POA: Diagnosis present

## 2016-07-20 MED ORDER — TECHNETIUM TC 99M-LABELED RED BLOOD CELLS IV KIT
20.0000 | PACK | Freq: Once | INTRAVENOUS | Status: AC | PRN
  Administered 2016-07-20: 20.4 via INTRAVENOUS

## 2016-07-20 MED ORDER — HEPARIN SOD (PORK) LOCK FLUSH 100 UNIT/ML IV SOLN
INTRAVENOUS | Status: AC
  Filled 2016-07-20: qty 5

## 2016-07-20 NOTE — Progress Notes (Signed)
lov 12/17 with labs, [id number] 06/05/16 on chart

## 2016-07-21 ENCOUNTER — Other Ambulatory Visit (HOSPITAL_COMMUNITY): Payer: Self-pay | Admitting: Oncology

## 2016-07-21 ENCOUNTER — Encounter (HOSPITAL_COMMUNITY): Payer: No Typology Code available for payment source

## 2016-07-21 ENCOUNTER — Encounter (HOSPITAL_COMMUNITY)
Admission: RE | Admit: 2016-07-21 | Discharge: 2016-07-21 | Disposition: A | Discharge status: Home / Self Care | Payer: No Typology Code available for payment source | Source: Ambulatory Visit | Attending: General Surgery | Admitting: General Surgery

## 2016-07-21 ENCOUNTER — Ambulatory Visit (HOSPITAL_COMMUNITY)
Admission: RE | Admit: 2016-07-21 | Discharge: 2016-07-21 | Disposition: A | Discharge status: Home / Self Care | Payer: No Typology Code available for payment source | Source: Ambulatory Visit | Attending: General Surgery | Admitting: General Surgery

## 2016-07-21 DIAGNOSIS — R590 Localized enlarged lymph nodes: Secondary | ICD-10-CM | POA: Insufficient documentation

## 2016-07-21 DIAGNOSIS — R918 Other nonspecific abnormal finding of lung field: Secondary | ICD-10-CM | POA: Insufficient documentation

## 2016-07-21 DIAGNOSIS — C50411 Malignant neoplasm of upper-outer quadrant of right female breast: Secondary | ICD-10-CM

## 2016-07-21 MED ORDER — IOPAMIDOL (ISOVUE-300) INJECTION 61%
INTRAVENOUS | Status: AC
  Filled 2016-07-21: qty 100

## 2016-07-21 MED ORDER — IOPAMIDOL (ISOVUE-300) INJECTION 61%
100.0000 mL | Freq: Once | INTRAVENOUS | Status: AC | PRN
  Administered 2016-07-21: 100 mL via INTRAVENOUS

## 2016-07-21 NOTE — Progress Notes (Signed)
START ON PATHWAY REGIMEN - Breast  [id number]: AC-T - [Doxorubicin + Cyclophosphamide q21 Days x 4 Cycles, Followed by Paclitaxel Weekly x 12 Weeks]  Doxorubicin + Cyclophosphamide (AC):   A cycle is every 21 days:     Doxorubicin (Adriamycin(R)) 60 mg/m2 IV Push followed by Dose Mod: None     Cyclophosphamide (Cytoxan(R)) 600 mg/m2 in 250 mL NS IV over 30 minutes Dose Mod: None  **Always confirm dose/schedule in your pharmacy ordering system**    Paclitaxel 80 mg/m2 Weekly:   Administer weekly:     Paclitaxel (Taxol(R)) 80 mg/m2 in 250 mL NS IV over 1 hour Dose Mod: None  **Always confirm dose/schedule in your pharmacy ordering system**    Patient Characteristics: Neoadjuvant Chemotherapy, HER2/neu Negative/Unknown/Equivocal, ER Positive AJCC Stage Grouping: IIB Current Disease Status: No Distant Mets or Local Recurrence AJCC M Stage: 0 ER Status: Positive (+) AJCC N Stage: 1 AJCC T Stage: 2 HER2/neu: Negative (-) PR Status: Negative (-)  Intent of Therapy: Curative Intent, Discussed with Patient

## 2016-07-22 ENCOUNTER — Encounter (HOSPITAL_BASED_OUTPATIENT_CLINIC_OR_DEPARTMENT_OTHER): Payer: Self-pay | Admitting: Anesthesiology

## 2016-07-22 ENCOUNTER — Inpatient Hospital Stay (HOSPITAL_COMMUNITY): Payer: Self-pay

## 2016-07-22 ENCOUNTER — Ambulatory Visit (HOSPITAL_COMMUNITY)
Admission: RE | Admit: 2016-07-22 | Payer: PRIVATE HEALTH INSURANCE | Source: Ambulatory Visit | Admitting: General Surgery

## 2016-07-22 ENCOUNTER — Inpatient Hospital Stay (HOSPITAL_COMMUNITY): Payer: PRIVATE HEALTH INSURANCE

## 2016-07-22 HISTORY — DX: Unspecified osteoarthritis, unspecified site: M19.90

## 2016-07-22 HISTORY — DX: Cough, unspecified: R05.9

## 2016-07-22 HISTORY — DX: Cough: R05

## 2016-07-22 HISTORY — DX: Malignant (primary) neoplasm, unspecified: C80.1

## 2016-07-22 SURGERY — INSERTION, TUNNELED CENTRAL VENOUS DEVICE, WITH PORT
Anesthesia: General

## 2016-07-23 ENCOUNTER — Encounter (HOSPITAL_COMMUNITY): Payer: PRIVATE HEALTH INSURANCE

## 2016-07-23 ENCOUNTER — Ambulatory Visit (HOSPITAL_BASED_OUTPATIENT_CLINIC_OR_DEPARTMENT_OTHER)
Admission: RE | Admit: 2016-07-23 | Discharge: 2016-07-23 | Disposition: A | Discharge status: Home / Self Care | Payer: No Typology Code available for payment source | Source: Ambulatory Visit | Attending: General Surgery | Admitting: General Surgery

## 2016-07-23 ENCOUNTER — Ambulatory Visit (HOSPITAL_BASED_OUTPATIENT_CLINIC_OR_DEPARTMENT_OTHER): Payer: No Typology Code available for payment source | Admitting: Anesthesiology

## 2016-07-23 ENCOUNTER — Encounter (HOSPITAL_BASED_OUTPATIENT_CLINIC_OR_DEPARTMENT_OTHER): Payer: Self-pay | Admitting: Anesthesiology

## 2016-07-23 ENCOUNTER — Encounter (HOSPITAL_BASED_OUTPATIENT_CLINIC_OR_DEPARTMENT_OTHER)
Admission: RE | Disposition: A | Discharge status: Home / Self Care | Payer: Self-pay | Source: Ambulatory Visit | Attending: General Surgery

## 2016-07-23 ENCOUNTER — Ambulatory Visit (HOSPITAL_COMMUNITY): Payer: No Typology Code available for payment source

## 2016-07-23 DIAGNOSIS — I10 Essential (primary) hypertension: Secondary | ICD-10-CM | POA: Diagnosis not present

## 2016-07-23 DIAGNOSIS — Z95828 Presence of other vascular implants and grafts: Secondary | ICD-10-CM

## 2016-07-23 DIAGNOSIS — Z79899 Other long term (current) drug therapy: Secondary | ICD-10-CM | POA: Diagnosis not present

## 2016-07-23 DIAGNOSIS — Z90710 Acquired absence of both cervix and uterus: Secondary | ICD-10-CM | POA: Diagnosis not present

## 2016-07-23 DIAGNOSIS — Z7984 Long term (current) use of oral hypoglycemic drugs: Secondary | ICD-10-CM | POA: Diagnosis not present

## 2016-07-23 DIAGNOSIS — E119 Type 2 diabetes mellitus without complications: Secondary | ICD-10-CM | POA: Insufficient documentation

## 2016-07-23 DIAGNOSIS — C50411 Malignant neoplasm of upper-outer quadrant of right female breast: Secondary | ICD-10-CM | POA: Insufficient documentation

## 2016-07-23 DIAGNOSIS — Z170 Estrogen receptor positive status [ER+]: Secondary | ICD-10-CM | POA: Insufficient documentation

## 2016-07-23 DIAGNOSIS — C50911 Malignant neoplasm of unspecified site of right female breast: Secondary | ICD-10-CM | POA: Diagnosis present

## 2016-07-23 DIAGNOSIS — Z791 Long term (current) use of non-steroidal anti-inflammatories (NSAID): Secondary | ICD-10-CM | POA: Insufficient documentation

## 2016-07-23 DIAGNOSIS — Z87891 Personal history of nicotine dependence: Secondary | ICD-10-CM | POA: Insufficient documentation

## 2016-07-23 HISTORY — PX: PORTACATH PLACEMENT: SHX2246

## 2016-07-23 LAB — GLUCOSE, CAPILLARY: Glucose-Capillary: 205 mg/dL — ABNORMAL HIGH (ref 65–99)

## 2016-07-23 SURGERY — INSERTION, TUNNELED CENTRAL VENOUS DEVICE, WITH PORT
Anesthesia: General | Site: Chest | Laterality: Left

## 2016-07-23 MED ORDER — CEFAZOLIN SODIUM-DEXTROSE 2-4 GM/100ML-% IV SOLN
2.0000 g | INTRAVENOUS | Status: AC
  Administered 2016-07-23: 2 g via INTRAVENOUS

## 2016-07-23 MED ORDER — HEPARIN SOD (PORK) LOCK FLUSH 100 UNIT/ML IV SOLN
500.0000 [IU] | INTRAVENOUS | Status: AC | PRN
  Administered 2016-07-23: 500 [IU]

## 2016-07-23 MED ORDER — DEXAMETHASONE SODIUM PHOSPHATE 10 MG/ML IJ SOLN
INTRAMUSCULAR | Status: AC
  Filled 2016-07-23: qty 1

## 2016-07-23 MED ORDER — HYDROMORPHONE HCL 1 MG/ML IJ SOLN: 0.2500 mg | INTRAMUSCULAR | Status: DC | PRN

## 2016-07-23 MED ORDER — MEPERIDINE HCL 25 MG/ML IJ SOLN: 6.2500 mg | INTRAMUSCULAR | Status: DC | PRN

## 2016-07-23 MED ORDER — PROPOFOL 10 MG/ML IV BOLUS
INTRAVENOUS | Status: DC | PRN
Start: 2016-07-23 — End: 2016-07-23
  Administered 2016-07-23: 150 mg via INTRAVENOUS

## 2016-07-23 MED ORDER — CHLORHEXIDINE GLUCONATE CLOTH 2 % EX PADS: 6.0000 | MEDICATED_PAD | Freq: Once | CUTANEOUS | Status: DC

## 2016-07-23 MED ORDER — MIDAZOLAM HCL 5 MG/5ML IJ SOLN
INTRAMUSCULAR | Status: DC | PRN
  Administered 2016-07-23: 2 mg via INTRAVENOUS

## 2016-07-23 MED ORDER — CEFAZOLIN SODIUM-DEXTROSE 2-4 GM/100ML-% IV SOLN
INTRAVENOUS | Status: AC
  Filled 2016-07-23: qty 100

## 2016-07-23 MED ORDER — LACTATED RINGERS IV SOLN
INTRAVENOUS | Status: DC | PRN
  Administered 2016-07-23 (×2): via INTRAVENOUS

## 2016-07-23 MED ORDER — ONDANSETRON HCL 4 MG/2ML IJ SOLN
INTRAMUSCULAR | Status: AC
  Filled 2016-07-23: qty 2

## 2016-07-23 MED ORDER — LIDOCAINE HCL (CARDIAC) 20 MG/ML IV SOLN
INTRAVENOUS | Status: DC | PRN
  Administered 2016-07-23: 30 mg via INTRAVENOUS

## 2016-07-23 MED ORDER — FENTANYL CITRATE (PF) 100 MCG/2ML IJ SOLN
INTRAMUSCULAR | Status: DC | PRN
  Administered 2016-07-23: 100 ug via INTRAVENOUS
  Administered 2016-07-23 (×2): 25 ug via INTRAVENOUS
  Administered 2016-07-23: 50 ug via INTRAVENOUS

## 2016-07-23 MED ORDER — ONDANSETRON HCL 4 MG/2ML IJ SOLN
INTRAMUSCULAR | Status: DC | PRN
  Administered 2016-07-23: 4 mg via INTRAVENOUS

## 2016-07-23 MED ORDER — HEPARIN SOD (PORK) LOCK FLUSH 100 UNIT/ML IV SOLN
INTRAVENOUS | Status: DC | PRN
  Administered 2016-07-23: 500 [IU]

## 2016-07-23 MED ORDER — DEXAMETHASONE SODIUM PHOSPHATE 4 MG/ML IJ SOLN
INTRAMUSCULAR | Status: DC | PRN
  Administered 2016-07-23: 10 mg via INTRAVENOUS

## 2016-07-23 MED ORDER — LIDOCAINE HCL 1 % IJ SOLN
INTRAMUSCULAR | Status: DC | PRN
  Administered 2016-07-23: 5 mL

## 2016-07-23 MED ORDER — HEPARIN (PORCINE) IN NACL 2-0.9 UNIT/ML-% IJ SOLN
INTRAMUSCULAR | Status: DC | PRN
  Administered 2016-07-23: 1

## 2016-07-23 MED ORDER — BUPIVACAINE-EPINEPHRINE 0.5% -1:200000 IJ SOLN
INTRAMUSCULAR | Status: DC | PRN
  Administered 2016-07-23: 5 mL

## 2016-07-23 MED ORDER — MIDAZOLAM HCL 2 MG/2ML IJ SOLN
INTRAMUSCULAR | Status: AC
  Filled 2016-07-23: qty 2

## 2016-07-23 MED ORDER — ONDANSETRON HCL 4 MG/2ML IJ SOLN: 4.0000 mg | Freq: Once | INTRAMUSCULAR | Status: DC | PRN

## 2016-07-23 MED ORDER — FENTANYL CITRATE (PF) 100 MCG/2ML IJ SOLN
INTRAMUSCULAR | Status: AC
  Filled 2016-07-23: qty 2

## 2016-07-23 MED ORDER — OXYCODONE HCL 5 MG PO TABS: 5.0000 mg | ORAL_TABLET | Freq: Four times a day (QID) | ORAL | 0 refills | Status: DC | PRN

## 2016-07-23 MED ORDER — LIDOCAINE 2% (20 MG/ML) 5 ML SYRINGE
INTRAMUSCULAR | Status: AC
  Filled 2016-07-23: qty 5

## 2016-07-23 SURGICAL SUPPLY — 44 items
ADH SKN CLS APL DERMABOND .7 (GAUZE/BANDAGES/DRESSINGS) ×1
BAG DECANTER FOR FLEXI CONT (MISCELLANEOUS) ×3 IMPLANT
BLADE HEX COATED 2.75 (ELECTRODE) ×3 IMPLANT
BLADE SURG 11 STRL SS (BLADE) ×3 IMPLANT
BLADE SURG 15 STRL LF DISP TIS (BLADE) ×1 IMPLANT
BLADE SURG 15 STRL SS (BLADE) ×3
CHLORAPREP W/TINT 26ML (MISCELLANEOUS) ×3 IMPLANT
COVER BACK TABLE 60X90IN (DRAPES) ×3 IMPLANT
COVER MAYO STAND STRL (DRAPES) ×3 IMPLANT
DECANTER SPIKE VIAL GLASS SM (MISCELLANEOUS) IMPLANT
DERMABOND ADVANCED (GAUZE/BANDAGES/DRESSINGS) ×2
DERMABOND ADVANCED .7 DNX12 (GAUZE/BANDAGES/DRESSINGS) ×1 IMPLANT
DRAPE C-ARM 42X72 X-RAY (DRAPES) ×3 IMPLANT
DRAPE LAPAROTOMY TRNSV 102X78 (DRAPE) ×3 IMPLANT
DRAPE UTILITY XL STRL (DRAPES) ×3 IMPLANT
DRSG TEGADERM 4X4.75 (GAUZE/BANDAGES/DRESSINGS) IMPLANT
ELECT REM PT RETURN 9FT ADLT (ELECTROSURGICAL) ×3
ELECTRODE REM PT RTRN 9FT ADLT (ELECTROSURGICAL) ×1 IMPLANT
GLOVE BIO SURGEON STRL SZ 6 (GLOVE) ×3 IMPLANT
GLOVE BIOGEL M STRL SZ7.5 (GLOVE) ×2 IMPLANT
GLOVE BIOGEL PI IND STRL 6.5 (GLOVE) ×1 IMPLANT
GLOVE BIOGEL PI IND STRL 8 (GLOVE) IMPLANT
GLOVE BIOGEL PI INDICATOR 6.5 (GLOVE) ×4
GLOVE BIOGEL PI INDICATOR 8 (GLOVE) ×2
GOWN STRL REUS W/ TWL LRG LVL3 (GOWN DISPOSABLE) ×1 IMPLANT
GOWN STRL REUS W/TWL 2XL LVL3 (GOWN DISPOSABLE) ×3 IMPLANT
GOWN STRL REUS W/TWL LRG LVL3 (GOWN DISPOSABLE) ×3
KIT PORT POWER 8FR ISP CVUE (Catheter) ×2 IMPLANT
NDL HYPO 25X1 1.5 SAFETY (NEEDLE) ×1 IMPLANT
NEEDLE HYPO 25X1 1.5 SAFETY (NEEDLE) ×3 IMPLANT
PACK BASIN DAY SURGERY FS (CUSTOM PROCEDURE TRAY) ×3 IMPLANT
PENCIL BUTTON HOLSTER BLD 10FT (ELECTRODE) ×3 IMPLANT
SLEEVE SCD COMPRESS KNEE MED (MISCELLANEOUS) ×3 IMPLANT
SPONGE GAUZE 4X4 12PLY STER LF (GAUZE/BANDAGES/DRESSINGS) IMPLANT
SUT MNCRL AB 4-0 PS2 18 (SUTURE) ×3 IMPLANT
SUT PROLENE 2 0 SH DA (SUTURE) ×6 IMPLANT
SUT VIC AB 3-0 SH 27 (SUTURE) ×3
SUT VIC AB 3-0 SH 27X BRD (SUTURE) ×1 IMPLANT
SUT VICRYL 3-0 CR8 SH (SUTURE) IMPLANT
SYR 10ML LL (SYRINGE) ×3 IMPLANT
SYR 5ML LUER SLIP (SYRINGE) ×3 IMPLANT
SYR CONTROL 10ML LL (SYRINGE) ×3 IMPLANT
TOWEL OR 17X24 6PK STRL BLUE (TOWEL DISPOSABLE) ×3 IMPLANT
TOWEL OR NON WOVEN STRL DISP B (DISPOSABLE) ×3 IMPLANT

## 2016-07-23 NOTE — Discharge Instructions (Addendum)
[facility] °Office Phone Number [phone] ° ° POST OP INSTRUCTIONS ° °Always review your discharge instruction sheet given to you by the facility where your surgery was performed. ° °IF YOU HAVE DISABILITY OR FAMILY LEAVE FORMS, YOU MUST BRING THEM TO THE OFFICE FOR PROCESSING.  DO NOT GIVE THEM TO YOUR DOCTOR. ° °1. A prescription for pain medication may be given to you upon discharge.  Take your pain medication as prescribed, if needed.  If narcotic pain medicine is not needed, then you may take acetaminophen (Tylenol) or ibuprofen (Advil) as needed. °2. Take your usually prescribed medications unless otherwise directed °3. If you need a refill on your pain medication, please contact your pharmacy.  They will contact our office to request authorization.  Prescriptions will not be filled after 5pm or on week-ends. °4. You should eat very light the first 24 hours after surgery, such as soup, crackers, pudding, etc.  Resume your normal diet the day after surgery °5. It is common to experience some constipation if taking pain medication after surgery.  Increasing fluid intake and taking a stool softener will usually help or prevent this problem from occurring.  A mild laxative (Milk of Magnesia or Miralax) should be taken according to package directions if there are no bowel movements after 48 hours. °6. You may shower in 48 hours.  The surgical glue will flake off in 2-3 weeks.   °7. ACTIVITIES:  No strenuous activity or heavy lifting for 1 week.   °a. You may drive when you no longer are taking prescription pain medication, you can comfortably wear a seatbelt, and you can safely maneuver your car and apply brakes. °b. RETURN TO WORK:  __________to be determined._______________ °You should see your doctor in the office for a follow-up appointment approximately three-four weeks after your surgery.   ° °WHEN TO CALL YOUR DOCTOR: °1. Fever over 101.0 °2. Nausea and/or vomiting. °3. Extreme swelling  or bruising. °4. Continued bleeding from incision. °5. Increased pain, redness, or drainage from the incision. ° °The clinic staff is available to answer your questions during regular business hours.  Please don’t hesitate to call and ask to speak to one of the nurses for clinical concerns.  If you have a medical emergency, go to the nearest emergency room or call 911.  A surgeon from [facility] is always on call at the hospital. ° °For further questions, please visit [email or web address]  ° ° ° ° °Post Anesthesia Home Care Instructions ° °Activity: °Get plenty of rest for the remainder of the day. A responsible adult should stay with you for 24 hours following the procedure.  °For the next 24 hours, DO NOT: °-Drive a car °-Operate machinery °-Drink alcoholic beverages °-Take any medication unless instructed by your physician °-Make any legal decisions or sign important papers. ° °Meals: °Start with liquid foods such as gelatin or soup. Progress to regular foods as tolerated. Avoid greasy, spicy, heavy foods. If nausea and/or vomiting occur, drink only clear liquids until the nausea and/or vomiting subsides. Call your physician if vomiting continues. ° °Special Instructions/Symptoms: °Your throat may feel dry or sore from the anesthesia or the breathing tube placed in your throat during surgery. If this causes discomfort, gargle with warm salt water. The discomfort should disappear within 24 hours. ° °If you had a scopolamine patch placed behind your ear for the management of post- operative nausea and/or vomiting: ° °1. The medication in the patch is effective for 72 hours, after   which it should be removed.  Wrap patch in a tissue and discard in the trash. Wash hands thoroughly with soap and water. °2. You may remove the patch earlier than 72 hours if you experience unpleasant side effects which may include dry mouth, dizziness or visual disturbances. °3. Avoid touching the patch. Wash your  hands with soap and water after contact with the patch. °  ° ° °

## 2016-07-23 NOTE — Op Note (Signed)
PREOPERATIVE DIAGNOSIS:  Right breast cancer     POSTOPERATIVE DIAGNOSIS:  Same     PROCEDURE: Left subclavian port placement, Bard ClearVue  Power Port, MRI safe, 8-French.      SURGEON:  Stark Klein, MD      ANESTHESIA:  General   FINDINGS:  Good venous return, easy flush, and tip of the catheter and   SVC 22.5 cm.      SPECIMEN:  None.      ESTIMATED BLOOD LOSS:  Minimal.      COMPLICATIONS:  None known.      PROCEDURE:  Pt was identified in the holding area and taken to   the operating room, where patient was placed supine on the operating room   table.  General anesthesia was induced.  Patient's arms were tucked and the upper   chest and neck were prepped and draped in sterile fashion.  Time-out was   performed according to the surgical safety check list.  When all was   correct, we continued.   Local anesthetic was administered over this   area at the angle of the clavicle.  The vein was accessed with 2 passes of the needle. There was good venous return and the wire passed easily with no ectopy.   Fluoroscopy was used to confirm that the wire was in the vena cava.      The patient was placed back level and the area for the pocket was anethetized   with local anesthetic.  A 3-cm transverse incision was made with a #15   blade.  Cautery was used to divide the subcutaneous tissues down to the   pectoralis muscle.  An [employer] retractor was used to elevate the skin   while a pocket was created on top of the pectoralis fascia.  The port   was placed into the pocket to confirm that it was of adequate size.  The   catheter was preattached to the port.  The port was then secured to the   pectoralis fascia with four 2-0 Prolene sutures.  These were clamped and   not tied down yet.    The catheter was tunneled through to the wire exit   site.  The catheter was placed along the wire to determine what length it should be to be in the SVC.  The catheter was cut at 22.5 cm.  The  tunneler sheath and dilator were passed over the wire and the dilator and wire were removed.  The catheter was advanced through the tunneler sheath and the tunneler sheath was pulled away.  Care was taken to keep the catheter in the tunneler sheath as this occurred. This was advanced and the tunneler sheath was removed.  There was good venous   return and easy flush of the catheter.  The Prolene sutures were tied   down to the pectoral fascia.  The skin was reapproximated using 3-0   Vicryl interrupted deep dermal sutures.    Fluoroscopy was used to re-confirm good position of the catheter.  The skin   was then closed using 4-0 Monocryl in a subcuticular fashion.  The port was flushed with concentrated heparin flush as well.  The wounds were then cleaned, dried, and dressed with Dermabond.  The patient was awakened from anesthesia and taken to the PACU in stable condition.  Needle, sponge, and instrument counts were correct.               Stark Klein, MD

## 2016-07-23 NOTE — Progress Notes (Signed)
Location of Breast Cancer: Right Breast  Histology per Pathology Report:  Breast cancer of upper-outer quadrant of right female breast ([place]) 07/01/2016 Pathology Results     Infiltrating ductal carcinoma ER+ 80%, PR-, HER 2-, Ki67 90%     Receptor Status: ER(80%), PR (NEG), Her2-neu (NEG), Ki-(90%)  Did patient present with symptoms or was this found on screening mammography?: She self palpated the mass in her Right Breast. She is not sure how long it was there. At the time of presentation she was also found to have a mass in her Right Axilla.  Past/Anticipated interventions by surgeon, if any: Dr. Barry Dienes plans surgery after Chemotherapy finishes.   07/23/16 PAC placed by Dr. Norman Clay  Past/Anticipated interventions by medical oncology, if any:  Dr. Whitney Muse at [facility] will give Chemotherapy. [Doxorubicin + Cyclophosphamide q21 Days x 4 Cycles, Followed by Paclitaxel Weekly x 12 Weeks]  She had her first chemotherapy on 07/24/16 at [place].   Lymphedema issues, if any: N/A  Pain issues, if any:  She denies  SAFETY ISSUES:  Prior radiation? No  Pacemaker/ICD? No  Possible current pregnancy? No  Is the patient on methotrexate? No  Current Complaints / other details: She took 2 oxycodone on 07/23/16 for pain during her PAC placement. She has developed constipation and has not had a bowel movement since 07/22/16. She has taken 5 stool softeners/ laxative starting Tuesday without result.   BP 122/78   Pulse 91   Temp 98.3 F (36.8 C)   Ht 5' 4"  (1.626 m)   Wt 186 lb 3.2 oz (84.5 kg)   SpO2 98% Comment: room air  BMI 31.96 kg/m     Wt Readings from Last 3 Encounters:  07/28/16 186 lb 3.2 oz (84.5 kg)  07/24/16 191 lb 6.4 oz (86.8 kg)  07/23/16 190 lb (86.2 kg)      [place] Minor, Stephani Police, RN 07/23/2016,10:08 AM

## 2016-07-23 NOTE — Progress Notes (Signed)
Porta cath deaccessed. Flushed with 10cc NS followed by Heparin 5ml (100u/ml). No bleeding to site, band aid to site for comfort. Lavan Imes M 

## 2016-07-23 NOTE — Interval H&P Note (Signed)
History and Physical Interval Note:  07/23/2016 9:27 AM  [name]  has presented today for surgery, with the diagnosis of RIGHT BREAST CANCER  The various methods of treatment have been discussed with the patient and family. After consideration of risks, benefits and other options for treatment, the patient has consented to  Procedure(s): INSERTION PORT-A-CATH (N/A) as a surgical intervention .  The patient's history has been reviewed, patient examined, no change in status, stable for surgery.  I have reviewed the patient's chart and labs.  Questions were answered to the patient's satisfaction.     Jammy Plotkin

## 2016-07-23 NOTE — Anesthesia Preprocedure Evaluation (Signed)
Anesthesia Evaluation  Patient identified by MRN, date of birth, ID band Patient awake    Reviewed: Allergy & Precautions, NPO status , Patient's Chart, lab work & pertinent test results  Airway Mallampati: I  TM Distance: >3 FB Neck ROM: Full    Dental   Pulmonary former smoker,    Pulmonary exam normal       Cardiovascular hypertension, Pt. on medications Normal cardiovascular exam    Neuro/Psych    GI/Hepatic   Endo/Other  diabetes, Type 2, Oral Hypoglycemic Agents  Renal/GU      Musculoskeletal   Abdominal   Peds  Hematology   Anesthesia Other Findings   Reproductive/Obstetrics                            Anesthesia Physical Anesthesia Plan  ASA: II  Anesthesia Plan: General   Post-op Pain Management:    Induction: Intravenous  Airway Management Planned: LMA  Additional Equipment:   Intra-op Plan:   Post-operative Plan: Extubation in OR  Informed Consent: I have reviewed the patients History and Physical, chart, labs and discussed the procedure including the risks, benefits and alternatives for the proposed anesthesia with the patient or authorized representative who has indicated his/her understanding and acceptance.     Plan Discussed with: CRNA and Surgeon  Anesthesia Plan Comments:         Anesthesia Quick Evaluation  

## 2016-07-23 NOTE — Anesthesia Procedure Notes (Signed)
Procedure Name: LMA Insertion Date/Time: 07/23/2016 10:05 AM Performed by: Toula Moos L Pre-anesthesia Checklist: Patient identified, Emergency Drugs available, Suction available, Patient being monitored and Timeout performed Patient Re-evaluated:Patient Re-evaluated prior to inductionOxygen Delivery Method: Circle system utilized Preoxygenation: Pre-oxygenation with 100% oxygen Intubation Type: IV induction Ventilation: Mask ventilation without difficulty LMA: LMA inserted LMA Size: 4.0 Number of attempts: 1 Airway Equipment and Method: Bite block Placement Confirmation: positive ETCO2 Tube secured with: Tape Dental Injury: Teeth and Oropharynx as per pre-operative assessment

## 2016-07-23 NOTE — Transfer of Care (Signed)
Immediate Anesthesia Transfer of Care Note  Patient: [name]  Procedure(s) Performed: Procedure(s): INSERTION PORT-A-CATH (Left)  Patient Location: PACU  Anesthesia Type:General  Level of Consciousness: awake  Airway & Oxygen Therapy: Patient Spontanous Breathing  Post-op Assessment: Report given to RN and Post -op Vital signs reviewed and stable  Post vital signs: Reviewed and stable  Last Vitals:  Vitals:   07/23/16 0946  BP: 135/77  Pulse: 82  Resp: 18  Temp: 36.8 C    Last Pain:  Vitals:   07/23/16 0946  TempSrc: Oral  PainSc: 0-No pain         Complications: No apparent anesthesia complications

## 2016-07-23 NOTE — H&P (View-Only) (Signed)
[name] 07/13/2016 3:06 PM Location: [facility] Patient #: [id number] DOB: 03/15/1956 Single / Language: English / Race: Black or African American Female   History of Present Illness (Trysten Berti MD; 07/13/2016 5:12 PM) The patient is a 61 year old female who presents with breast cancer. Patient is a 61-year-old female referred by Dr. Shah for consultation regarding a new diagnosis of right breast cancer. The patient presented with a palpable mass in her right breast. She's not sure how long it has been there. She was also found to have a mass in her axilla. Diagnostic imaging was performed which demonstrated a 3.5 cm mass at 10:00 6 cm from the right nipple. She also was found to have a enlarged lymph node on imaging. Both of these were biopsied percutaneously. Of note, the axillary lymph node was also clipped after biopsy. 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 Prognostic panel is ER positive, PR negative, HER-2/neu not overexpressed. Ki-67 is 90%. She denies any new joint pain or bone pain. She has had a recent cough.  She has a maternal cousin who had breast cancer. She states that her maternal grandfather had some type of cancer that came from smoking, but she is not sure what kind.    Past Surgical History (Sade Bradford, CMA; 07/13/2016 3:09 PM) Hysterectomy (due to cancer) - Complete   Diagnostic Studies History (Sade Bradford, CMA; 07/13/2016 3:09 PM) Colonoscopy  1-5 years ago  Allergies (Sade Bradford, CMA; 07/13/2016 3:10 PM) No Known Drug Allergies 07/13/2016  Medication History (Sade Bradford, CMA; 07/13/2016 3:11 PM) AmLODIPine Besylate (10MG Tablet, Oral daily) Active. Atorvastatin Calcium (10MG Tablet, Oral daily) Active. Ibuprofen (800MG Tablet, Oral as needed) Active. Lisinopril (40MG Tablet, Oral daily) Active. GlyBURIDE-MetFORMIN (5-500MG Tablet, Oral daily) Active. Fexofenadine HCl (180MG Tablet, Oral daily)  Active. Multiple Vitamin (Oral daily) Active. Vitamin D (Cholecalciferol) (1000UNIT Capsule, Oral daily) Active. Calcium (500MG Tablet, Oral daily) Active. Omega 3 (1000MG Capsule, Oral daily) Active. Medications Reconciled  Social History (Sade Bradford, CMA; 07/13/2016 3:09 PM) Caffeine use  Coffee. No alcohol use  No drug use  Tobacco use  Former smoker.  Family History (Sade Bradford, CMA; 07/13/2016 3:09 PM) Alcohol Abuse  Father. Arthritis  Mother. Diabetes Mellitus  Mother. Hypertension  Mother.  Pregnancy / Birth History (Sade Bradford, CMA; 07/13/2016 3:09 PM) Age of menopause  46-50 Maternal age  15-20 Para  2  Other Problems (Sade Bradford, CMA; 07/13/2016 3:09 PM) Diabetes Mellitus  High blood pressure     Review of Systems (Sade Bradford CMA; 07/13/2016 3:09 PM) General Not Present- Appetite Loss, Chills, Fatigue, Fever, Night Sweats, Weight Gain and Weight Loss. Skin Not Present- Change in Wart/Mole, Dryness, Hives, Jaundice, New Lesions, Non-Healing Wounds, Rash and Ulcer. HEENT Present- Hearing Loss, Ringing in the Ears, Seasonal Allergies and Wears glasses/contact lenses. Not Present- Earache, Hoarseness, Nose Bleed, Oral Ulcers, Sinus Pain, Sore Throat, Visual Disturbances and Yellow Eyes. Respiratory Not Present- Bloody sputum, Chronic Cough, Difficulty Breathing, Snoring and Wheezing. Breast Present- Breast Mass. Not Present- Breast Pain, Nipple Discharge and Skin Changes. Cardiovascular Present- Leg Cramps. Not Present- Chest Pain, Difficulty Breathing Lying Down, Palpitations, Rapid Heart Rate, Shortness of Breath and Swelling of Extremities. Gastrointestinal Not Present- Abdominal Pain, Bloating, Bloody Stool, Change in Bowel Habits, Chronic diarrhea, Constipation, Difficulty Swallowing, Excessive gas, Gets full quickly at meals, Hemorrhoids, Indigestion, Nausea, Rectal Pain and Vomiting. Female Genitourinary Not Present- Frequency, Nocturia,  Painful Urination, Pelvic Pain and Urgency. Musculoskeletal Present- Joint   Pain and Joint Stiffness. Not Present- Back Pain, Muscle Pain, Muscle Weakness and Swelling of Extremities. Neurological Present- Numbness. Not Present- Decreased Memory, Fainting, Headaches, Seizures, Tingling, Tremor, Trouble walking and Weakness. Psychiatric Not Present- Anxiety, Bipolar, Change in Sleep Pattern, Depression, Fearful and Frequent crying. Endocrine Present- Hot flashes. Not Present- Cold Intolerance, Excessive Hunger, Hair Changes, Heat Intolerance and New Diabetes. Hematology Not Present- Blood Thinners, Easy Bruising, Excessive bleeding, Gland problems, HIV and Persistent Infections.  Vitals Bary Castilla Bradford CMA; 07/13/2016 3:11 PM) 07/13/2016 3:11 PM Weight: 188 lb Height: 64.5in Body Surface Area: 1.92 m Body Mass Index: 31.77 kg/m  Temp.: 98.23F  Pulse: 85 (Regular)  BP: 126/78 (Sitting, Left Arm, Standard)       Physical Exam Stark Klein MD; 07/13/2016 5:14 PM) General Mental Status-Alert. General Appearance-Consistent with stated age. Hydration-Well hydrated. Voice-Normal.  Head and Neck Head-normocephalic, atraumatic with no lesions or palpable masses. Trachea-midline. Thyroid Gland Characteristics - normal size and consistency.  Eye Eyeball - Bilateral-Extraocular movements intact. Sclera/Conjunctiva - Bilateral-No scleral icterus.  Chest and Lung Exam Chest and lung exam reveals -quiet, even and easy respiratory effort with no use of accessory muscles and on auscultation, normal breath sounds, no adventitious sounds and normal vocal resonance. Inspection Chest Wall - Normal. Back - normal.  Breast Note: 3-4 cm mobile mass in the upper outer quadrant of the right breast. some faint overlying bruising, but no large hematoma. Palpable 2-3 cm LN in the axilla. mobile. no additional LAD palpated in the infraclavicular or supraclavicular location. No  matted nodes are palpated. Both breasts are ptotic bilaterally. No skin dimpling, nipple retraction or nipple discharge. left breast negative.   Cardiovascular Cardiovascular examination reveals -normal heart sounds, regular rate and rhythm with no murmurs and normal pedal pulses bilaterally.  Abdomen Inspection Inspection of the abdomen reveals - No Hernias. Palpation/Percussion Palpation and Percussion of the abdomen reveal - Soft, Non Tender, No Rebound tenderness, No Rigidity (guarding) and No hepatosplenomegaly. Auscultation Auscultation of the abdomen reveals - Bowel sounds normal.  Neurologic Neurologic evaluation reveals -alert and oriented x 3 with no impairment of recent or remote memory. Mental Status-Normal.  Musculoskeletal Global Assessment -Note: no gross deformities.  Normal Exam - Left-Upper Extremity Strength Normal and Lower Extremity Strength Normal. Normal Exam - Right-Upper Extremity Strength Normal and Lower Extremity Strength Normal.  Lymphatic Head & Neck  General Head & Neck Lymphatics: Bilateral - Description - Normal. Axillary  General Axillary Region: Bilateral - Description - Normal. Tenderness - Non Tender. Femoral & Inguinal  Generalized Femoral & Inguinal Lymphatics: Bilateral - Description - No Generalized lymphadenopathy.    Assessment & Plan Stark Klein MD; 07/13/2016 5:18 PM) MALIGNANT NEOPLASM OF UPPER-OUTER QUADRANT OF RIGHT BREAST IN FEMALE, ESTROGEN RECEPTOR POSITIVE (C50.411) Impression: Patient presents with a new diagnosis of clinical stage IIb right breast cancer. Based on her clinically positive lymph node, I have ordered staging studies including a CT of the chest abdomen and pelvis, and a bone scan.  I will refer her to Dr. Ancil Linsey at [facility]. She will get neoadjuvant chemotherapy. I will place port next week. I reviewed placement and risks.  I have advised her to discuss with HR what sort  of forms she will need to get her treatment. She will likely need FMLA paperwork filled out.  Hopefully she will have a good response to chemotherapy. If we can downstage her axilla, we may be able to do a targeted sentinel node biopsy instead of an ALND.  She will also be referred to radiation oncology, but will not need XRT until post op which will be about 6 months. She is a lumpectomy candidate. 45 min spent in evaluation, examination, counseling, and coordination of care. >50% spent in counseling. Current Plans You are being scheduled for surgery- Our schedulers will call you.  You should hear from our office's scheduling department within 5 working days about the location, date, and time of surgery. We try to make accommodations for patient's preferences in scheduling surgery, but sometimes the OR schedule or the surgeon's schedule prevents [place] from making those accommodations.  If you have not heard from our office [phone]) in 5 working days, call the office and ask for your surgeon's nurse.  If you have other questions about your diagnosis, plan, or surgery, call the office and ask for your surgeon's nurse.  Referred to Oncology, for evaluation and follow up (Oncology). Routine. Referred to Radiation Oncology, for evaluation and follow up (Radiation Oncology). Routine. CT ABDOMEN AND PELVIS W CONTRAST [id number]) (New Lymph node positive right breast cancer) NUCLEAR SCAN OF BONE ([phone]) (new breast cancer, cT2N1, staging) CT CHEST W CON (89169) (new right breast cancer, lymph node positive cT2N1) Pt Education - ccs port insertion education CBC, PLATELETS & AUT DIFF (45038) METABOLIC PANEL, COMPREHENSIVE (88280)   Signed by Stark Klein, MD (07/13/2016 5:19 PM)

## 2016-07-23 NOTE — Anesthesia Postprocedure Evaluation (Signed)
Anesthesia Post Note  Patient: CORENNA FORNSHELL  Procedure(s) Performed: Procedure(s) (LRB): INSERTION PORT-A-CATH (Left)  Patient location during evaluation: PACU Anesthesia Type: General Level of consciousness: awake and alert Pain management: pain level controlled Vital Signs Assessment: post-procedure vital signs reviewed and stable Respiratory status: spontaneous breathing, nonlabored ventilation, respiratory function stable and patient connected to nasal cannula oxygen Cardiovascular status: blood pressure returned to baseline and stable Postop Assessment: no signs of nausea or vomiting Anesthetic complications: no       Last Vitals:  Vitals:   07/23/16 1100 07/23/16 1115  BP: 127/86 124/81  Pulse: 85 71  Resp: 11 17  Temp:      Last Pain:  Vitals:   07/23/16 1115  TempSrc:   PainSc: 0-No pain                 Righteous Claiborne DAVID

## 2016-07-24 ENCOUNTER — Encounter (HOSPITAL_BASED_OUTPATIENT_CLINIC_OR_DEPARTMENT_OTHER): Payer: PRIVATE HEALTH INSURANCE

## 2016-07-24 VITALS — BP 134/73 | HR 82 | Temp 98.0°F | Resp 18 | Wt 191.4 lb

## 2016-07-24 DIAGNOSIS — Z5189 Encounter for other specified aftercare: Secondary | ICD-10-CM

## 2016-07-24 DIAGNOSIS — Z170 Estrogen receptor positive status [ER+]: Principal | ICD-10-CM

## 2016-07-24 DIAGNOSIS — C50411 Malignant neoplasm of upper-outer quadrant of right female breast: Secondary | ICD-10-CM | POA: Diagnosis not present

## 2016-07-24 DIAGNOSIS — Z5111 Encounter for antineoplastic chemotherapy: Secondary | ICD-10-CM

## 2016-07-24 LAB — CBC WITH DIFFERENTIAL/PLATELET
BASOS PCT: 0 %
Basophils Absolute: 0 10*3/uL (ref 0.0–0.1)
EOS ABS: 0 10*3/uL (ref 0.0–0.7)
Eosinophils Relative: 0 %
HCT: 35.3 % — ABNORMAL LOW (ref 36.0–46.0)
HEMOGLOBIN: 12.4 g/dL (ref 12.0–15.0)
Lymphocytes Relative: 14 %
Lymphs Abs: 2.3 10*3/uL (ref 0.7–4.0)
MCH: 29.5 pg (ref 26.0–34.0)
MCHC: 35.1 g/dL (ref 30.0–36.0)
MCV: 84 fL (ref 78.0–100.0)
Monocytes Absolute: 0.6 10*3/uL (ref 0.1–1.0)
Monocytes Relative: 4 %
NEUTROS PCT: 82 %
Neutro Abs: 13.3 10*3/uL — ABNORMAL HIGH (ref 1.7–7.7)
Platelets: 257 10*3/uL (ref 150–400)
RBC: 4.2 MIL/uL (ref 3.87–5.11)
RDW: 12.4 % (ref 11.5–15.5)
WBC: 16.2 10*3/uL — AB (ref 4.0–10.5)

## 2016-07-24 LAB — COMPREHENSIVE METABOLIC PANEL
ALBUMIN: 3.2 g/dL — AB (ref 3.5–5.0)
ALT: 20 U/L (ref 14–54)
AST: 17 U/L (ref 15–41)
Alkaline Phosphatase: 77 U/L (ref 38–126)
Anion gap: 7 (ref 5–15)
BUN: 17 mg/dL (ref 6–20)
CALCIUM: 9.3 mg/dL (ref 8.9–10.3)
CO2: 28 mmol/L (ref 22–32)
CREATININE: 0.84 mg/dL (ref 0.44–1.00)
Chloride: 103 mmol/L (ref 101–111)
GFR calc Af Amer: >60 mL/min (ref >60)
GFR calc non Af Amer: >60 mL/min (ref >60)
GLUCOSE: 231 mg/dL — AB (ref 65–99)
Potassium: 3.5 mmol/L (ref 3.5–5.1)
Sodium: 138 mmol/L (ref 135–145)
Total Bilirubin: 0.6 mg/dL (ref 0.3–1.2)
Total Protein: 7 g/dL (ref 6.5–8.1)

## 2016-07-24 MED ORDER — FOSAPREPITANT DIMEGLUMINE INJECTION 150 MG
Freq: Once | INTRAVENOUS | Status: AC
  Administered 2016-07-24: 11:00:00 via INTRAVENOUS
  Filled 2016-07-24: qty 5

## 2016-07-24 MED ORDER — DOXORUBICIN HCL CHEMO IV INJECTION 2 MG/ML
60.0000 mg/m2 | Freq: Once | INTRAVENOUS | Status: AC
  Administered 2016-07-24: 118 mg via INTRAVENOUS
  Filled 2016-07-24: qty 59

## 2016-07-24 MED ORDER — SODIUM CHLORIDE 0.9% FLUSH
10.0000 mL | INTRAVENOUS | Status: DC | PRN
Start: 2016-07-24 — End: 2016-07-24
  Administered 2016-07-24: 10 mL
  Filled 2016-07-24: qty 10

## 2016-07-24 MED ORDER — HEPARIN SOD (PORK) LOCK FLUSH 100 UNIT/ML IV SOLN
500.0000 [IU] | Freq: Once | INTRAVENOUS | Status: AC | PRN
  Administered 2016-07-24: 500 [IU]
  Filled 2016-07-24: qty 5

## 2016-07-24 MED ORDER — PALONOSETRON HCL INJECTION 0.25 MG/5ML
0.2500 mg | Freq: Once | INTRAVENOUS | Status: AC
  Administered 2016-07-24: 0.25 mg via INTRAVENOUS
  Filled 2016-07-24: qty 5

## 2016-07-24 MED ORDER — PEGFILGRASTIM 6 MG/0.6ML ~~LOC~~ PSKT
6.0000 mg | PREFILLED_SYRINGE | Freq: Once | SUBCUTANEOUS | Status: AC
Start: 2016-07-24 — End: 2016-07-24
  Administered 2016-07-24: 6 mg via SUBCUTANEOUS
  Filled 2016-07-24: qty 0.6

## 2016-07-24 MED ORDER — SODIUM CHLORIDE 0.9 % IV SOLN
Freq: Once | INTRAVENOUS | Status: AC
Start: 2016-07-24 — End: 2016-07-24
  Administered 2016-07-24: 11:00:00 via INTRAVENOUS

## 2016-07-24 MED ORDER — SODIUM CHLORIDE 0.9 % IV SOLN
600.0000 mg/m2 | Freq: Once | INTRAVENOUS | Status: AC
  Administered 2016-07-24: 1180 mg via INTRAVENOUS
  Filled 2016-07-24: qty 59

## 2016-07-24 NOTE — Progress Notes (Signed)
[name] tolerated chemo tx as well as Neulasta on-pro well without complaints or incident. Labs reviewed prior to administering chemotherapy.Reviewed purpose and side effects of pre-meds,Neulasta and chemo medications. Pt verbalized understanding. Neulasta on-pro applied to pt's left arm with green indicator light flashing. VSS upon discharge. Pt discharged self ambulatory in satisfactory condition accompanied by family

## 2016-07-24 NOTE — Progress Notes (Signed)
Recent CXR and ECHO reviewed prior to administering chemotherapy tx today

## 2016-07-24 NOTE — Patient Instructions (Signed)
[facility] Discharge Instructions for Patients Receiving Chemotherapy   Beginning January 23rd 2017 lab work for the Cancer Center will be done in the  Main lab at [place] on 1st floor. If you have a lab appointment with the Cancer Center please come in thru the  Main Entrance and check in at the main information desk   Today you received the following chemotherapy agents Adriamycin and Cytoxan as well as Neulasta on-pro. Follow-up as scheduled. Call clinic for any questions or concerns  To help prevent nausea and vomiting after your treatment, we encourage you to take your nausea medication   If you develop nausea and vomiting, or diarrhea that is not controlled by your medication, call the clinic.  The clinic phone number is [phone]. Office hours are Monday-Friday 8:30am-5:00pm.  BELOW ARE SYMPTOMS THAT SHOULD BE REPORTED IMMEDIATELY:  *FEVER GREATER THAN 101.0 F  *CHILLS WITH OR WITHOUT FEVER  NAUSEA AND VOMITING THAT IS NOT CONTROLLED WITH YOUR NAUSEA MEDICATION  *UNUSUAL SHORTNESS OF BREATH  *UNUSUAL BRUISING OR BLEEDING  TENDERNESS IN MOUTH AND THROAT WITH OR WITHOUT PRESENCE OF ULCERS  *URINARY PROBLEMS  *BOWEL PROBLEMS  UNUSUAL RASH Items with * indicate a potential emergency and should be followed up as soon as possible. If you have an emergency after office hours please contact your primary care physician or go to the nearest emergency department.  Please call the clinic during office hours if you have any questions or concerns.   You may also contact the Patient Navigator at [phone] should you have any questions or need assistance in obtaining follow up care.      Resources For Cancer Patients and their Caregivers ? American Cancer Society: Can assist with transportation, wigs, general needs, runs Look Good Feel Better.        1-888-227-6333 ? Cancer Care: Provides financial assistance, online support groups,  medication/co-pay assistance.  1-800-813-HOPE (4673) ? [facility] Assists [place] cancer patients and their families through emotional , educational and financial support.  336-427-4357 ? [place] DSS Where to apply for food stamps, Medicaid and utility assistance. 336-342-1394 ? [facility]: Transportation to medical appointments. 336-347-2287 ? Social Security Administration: May apply for disability if have a Stage IV cancer. 336-342-7796 1-800-772-1213 ? [place] Aging, Disability and Transit Services: Assists with nutrition, care and transit needs. 336-349-2343         

## 2016-07-26 ENCOUNTER — Encounter (HOSPITAL_BASED_OUTPATIENT_CLINIC_OR_DEPARTMENT_OTHER): Payer: Self-pay | Admitting: General Surgery

## 2016-07-27 ENCOUNTER — Telehealth (HOSPITAL_COMMUNITY): Payer: Self-pay

## 2016-07-27 NOTE — Telephone Encounter (Signed)
See telephone encounter note.

## 2016-07-28 ENCOUNTER — Ambulatory Visit
Admission: RE | Admit: 2016-07-28 | Discharge: 2016-07-28 | Disposition: A | Discharge status: Home / Self Care | Payer: No Typology Code available for payment source | Source: Ambulatory Visit | Attending: Radiation Oncology | Admitting: Radiation Oncology

## 2016-07-28 ENCOUNTER — Encounter: Payer: Self-pay | Admitting: Radiation Oncology

## 2016-07-28 DIAGNOSIS — Z79899 Other long term (current) drug therapy: Secondary | ICD-10-CM | POA: Diagnosis not present

## 2016-07-28 DIAGNOSIS — Z170 Estrogen receptor positive status [ER+]: Secondary | ICD-10-CM | POA: Diagnosis not present

## 2016-07-28 DIAGNOSIS — Z7982 Long term (current) use of aspirin: Secondary | ICD-10-CM | POA: Diagnosis not present

## 2016-07-28 DIAGNOSIS — C50411 Malignant neoplasm of upper-outer quadrant of right female breast: Secondary | ICD-10-CM

## 2016-07-28 NOTE — Progress Notes (Signed)
Radiation Oncology         [phone] ________________________________  Initial Outpatient Consultation  Name: [name] MRN: [id number]  Date: 07/28/2016  DOB: 1956/06/06  MB:EMLJ,QGBEEF, MD  Stark Klein, MD   REFERRING PHYSICIAN: Stark Klein, MD  DIAGNOSIS:    ICD-9-CM ICD-10-CM   1. 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 female who presented with self-palpated mass in her right breast. At the time of presentation she was also found to have a mass in her Right Axilla. Biopsy on 07/01/16 showed invasive ductal carcinoma with metastatic carcinoma in one lymph node with characteristics as described above in the diagnosis.  She underwent CT of chest, abdomen, and pelvis with contrast on 07/21/16. There were tiny subcentimeter bilateral pulmonary nodules favored to be postinflammatory, but still indeterminate. No evidence of distant metastatic disease elsewhere. A bone scan has been ordered and is pending.  She started neo adjuvant systemic chemotherapy last week with Dr Whitney Muse at [facility].    The patient presents to Radiation Oncology this morning to discuss the role that radiation may play in the treatment of her disease. She is accompanied by her husband and son.  On review of systems, the patient denies pain, except for a persistent moderate headache for the last 3 days. She denies dizziness or weakness in her extremities. She denies chest pain. The patient denies any bladder concerns. She reports constipation, and has not had a bowel movement since 07/22/16. She has taken 5 stool softeners without result.  PREVIOUS RADIATION  THERAPY: No  PAST MEDICAL HISTORY:  has a past medical history of Arthritis; Cancer ([place]); Cough; Diabetes mellitus without complication ([place]); Hypertension; and Ruptured eardrum.    PAST SURGICAL HISTORY: Past Surgical History:  Procedure Laterality Date  . ABDOMINAL HYSTERECTOMY     with bilateral bso  . COLONOSCOPY    . PORTACATH PLACEMENT Left 07/23/2016   Procedure: INSERTION PORT-A-CATH;  Surgeon: Stark Klein, MD;  Location: [facility];  Service: General;  Laterality: Left;    FAMILY HISTORY: family history is not on file.  SOCIAL HISTORY:  reports that she has quit smoking. Her smoking use included Cigarettes. She has never used smokeless tobacco. She reports that she does not drink alcohol or use drugs.  ALLERGIES: Patient has no known allergies.  MEDICATIONS:  Current Outpatient Prescriptions  Medication Sig Dispense Refill  . amLODipine (NORVASC) 10 MG tablet Take 10 mg by mouth daily.    [name] aspirin EC 81 MG tablet Take 81 mg by mouth daily.    [name] atorvastatin (LIPITOR) 10 MG tablet Take 10 mg by mouth every evening.  4  . glyBURIDE-metformin (GLUCOVANCE) 5-500 MG tablet Take 1 tablet by mouth 2 (two) times daily.  4  . hydrochlorothiazide (HYDRODIURIL) 25 MG tablet Take 25 mg by mouth daily.    [name] ibuprofen (ADVIL,MOTRIN) 800 MG tablet Take 800 mg by mouth every 8 (eight) hours as needed.  0  . lidocaine-prilocaine (EMLA) cream Apply a quarter size amount to affected area 1 hour prior to coming to chemotherapy. 30 g 2  . lisinopril (PRINIVIL,ZESTRIL) 40 MG tablet Take 40 mg by  mouth daily.    . ondansetron (ZOFRAN) 8 MG tablet Take 1 tablet (8 mg total) by mouth every 8 (eight) hours as needed for nausea or vomiting. 30 tablet 2  . oxyCODONE (OXY IR/ROXICODONE) 5 MG immediate release tablet Take 1-2 tablets (5-10 mg total) by mouth every 6 (six) hours as needed for moderate pain, severe pain or breakthrough pain. 30 tablet 0  . prochlorperazine (COMPAZINE) 10  MG tablet Take 1 tablet (10 mg total) by mouth every 6 (six) hours as needed for nausea or vomiting. 30 tablet 2  . sitaGLIPtin (JANUVIA) 100 MG tablet Take 100 mg by mouth daily.     No current facility-administered medications for this encounter.     REVIEW OF SYSTEMS: A 10+ POINT REVIEW OF SYSTEMS WAS OBTAINED including neurology, dermatology, psychiatry, cardiac, respiratory, lymph, extremities, GI, GU, Musculoskeletal, constitutional, breasts, reproductive, HEENT.  All pertinent positives are noted in the HPI.  All others are negative.   PHYSICAL EXAM:  height is _0  (1.626 m) and weight is 186 lb 3.2 oz (84.5 kg). Her temperature is 98.3 F (36.8 C). Her blood pressure is 122/78 and her pulse is 91. Her oxygen saturation is 98%.   General: Alert and oriented, in no acute distress. Neck: Neck is supple, no palpable cervical or supraclavicular lymphadenopathy. Vascular: New PAC site in the left upper chest with some associated bruising. Heart: Regular in rate and rhythm with no murmurs. Chest: Clear to auscultation bilaterally. Abdomen: Soft, nontender, nondistended. Extremities: No edema in upper or lower extremities. Lymphatics: see Neck Exam Skin: see Breast Exam. Neurologic: No obvious focalities. Speech is fluent. Coordination is intact. Psychiatric: Judgment and insight are intact. Affect is appropriate. Breasts: Large axillary mass noted on the right. It does not seem fixed or matted. I would estimate it is about 4 cm in dimension. In the upper outer quadrant of the right breast there is a palpable mass approximately 3-4 cm in greatest dimension. No other palpable masses appreciated in the right breast. No palpable masses appreciated in the left breast or axilla.    ECOG = 0  0 - Asymptomatic (Fully active, able to carry on all predisease activities without restriction)  1 - Symptomatic but completely ambulatory (Restricted in physically strenuous activity but ambulatory and  able to carry out work of a light or sedentary nature. For example, light housework, office work)  2 - Symptomatic, <50% in bed during the day (Ambulatory and capable of all self care but unable to carry out any work activities. Up and about more than 50% of waking hours)  3 - Symptomatic, >50% in bed, but not bedbound (Capable of only limited self-care, confined to bed or chair 50% or more of waking hours)  4 - Bedbound (Completely disabled. Cannot carry on any self-care. Totally confined to bed or chair)  5 - Death   Eustace Pen MM, Creech RH, Tormey DC, et al. [phone]). "Toxicity and response criteria of the [facility]". [place] Oncol. 5 (6): 649-55   LABORATORY DATA:  Lab Results  Component Value Date   WBC 16.2 (H) 07/24/2016   HGB 12.4 07/24/2016   HCT 35.3 (L) 07/24/2016   MCV 84.0 07/24/2016   PLT 257 07/24/2016   CMP     Component Value Date/Time   NA 138 07/24/2016 1010   K 3.5 07/24/2016 1010   CL 103 07/24/2016 1010   CO2 28 07/24/2016 1010   GLUCOSE 231 (H) 07/24/2016 1010  BUN 17 07/24/2016 1010   CREATININE 0.84 07/24/2016 1010   CALCIUM 9.3 07/24/2016 1010   PROT 7.0 07/24/2016 1010   ALBUMIN 3.2 (L) 07/24/2016 1010   AST 17 07/24/2016 1010   ALT 20 07/24/2016 1010   ALKPHOS 77 07/24/2016 1010   BILITOT 0.6 07/24/2016 1010   GFRNONAA >60 07/24/2016 1010   GFRAA >60 07/24/2016 1010         RADIOGRAPHY: CT Images reviewed by me  Ct Chest W Contrast  Result Date: 07/21/2016 CLINICAL DATA:  Newly diagnosed right upper outer quadrant breast carcinoma. Staging. EXAM: CT CHEST, ABDOMEN, AND PELVIS WITH CONTRAST TECHNIQUE: Multidetector CT imaging of the chest, abdomen and pelvis was performed following the standard protocol during bolus administration of intravenous contrast. CONTRAST:  158m ISOVUE-300 IOPAMIDOL (ISOVUE-300) INJECTION 61% COMPARISON:  None. FINDINGS: CT CHEST FINDINGS Cardiovascular: No acute findings. Aortic and  coronary artery atherosclerosis. Mediastinum/Lymph Nodes: 2.9 cm soft tissue mass in upper outer quadrant right breast is consistent with known primary breast carcinoma. Right axillary and subpectoral lymphadenopathy is seen, with largest lymph node measuring 3.2 cm in the right axilla on image 18/2. No mediastinal or hilar lymphadenopathy. Lungs/Pleura: Scattered thin wall cysts are seen bilaterally. Mild scarring is seen in the medial right lower lobe. A few scattered tiny pulmonary nodular densities are seen in both lungs measuring 5 mm or less. These are nonspecific and bladder statistically more likely to be postinflammatory etiology rather than early pulmonary metastases. No evidence of pulmonary infiltrate or pleural effusion. Musculoskeletal:  No suspicious bone lesions identified. CT ABDOMEN AND PELVIS FINDINGS Hepatobiliary: No masses identified. Mild hepatic steatosis. Gallbladder is unremarkable. Pancreas:  No mass or inflammatory changes. Spleen:  Within normal limits in size and appearance. Adrenals/Urinary tract:  No masses or hydronephrosis. Stomach/Bowel: No evidence of obstruction, inflammatory process, or abnormal fluid collections. Vascular/Lymphatic: No pathologically enlarged lymph nodes identified. No abdominal aortic aneurysm. Aortic atherosclerosis. Reproductive: Prior hysterectomy noted. Adnexal regions are unremarkable in appearance. Other:  None. Musculoskeletal:  No suspicious bone lesions identified. IMPRESSION: 2.9 cm right upper outer quadrant breast mass, consistent with known primary breast carcinoma. Right axillary and subpectoral lymphadenopathy, consistent with metastatic disease. Tiny sub-cm bilateral pulmonary nodules are indeterminate, but favor postinflammatory etiology over early metastases. Recommend continued attention on follow-up CT. No evidence of metastatic disease within the abdomen or pelvis. Electronically Signed   By: JEarle GellM.D.   On: 07/21/2016 15:05   Nm  Cardiac Muga Rest  Result Date: 07/20/2016 CLINICAL DATA:  Breast cancer, cardiotoxic chemotherapy EXAM: NUCLEAR MEDICINE CARDIAC BLOOD POOL IMAGING (MUGA) TECHNIQUE: Cardiac multi-gated acquisition was performed at rest following intravenous injection of Tc-968mabeled red blood cells. RADIOPHARMACEUTICALS:  20.4 mCi Tc-9923mrtechnetate in-vitro labeled autologous red blood cells IV COMPARISON:  None FINDINGS: Upper normal LEFT ventricular ejection fraction of 75%. Normal LEFT ventricular wall motion. Patient was rhythmic during imaging. IMPRESSION: Upper normal left ventricular ejection fraction of 75% with normal LV wall motion. Electronically Signed   By: MarLavonia DanaD.   On: 07/20/2016 16:00   Ct Abdomen Pelvis W Contrast  Result Date: 07/21/2016 CLINICAL DATA:  Newly diagnosed right upper outer quadrant breast carcinoma. Staging. EXAM: CT CHEST, ABDOMEN, AND PELVIS WITH CONTRAST TECHNIQUE: Multidetector CT imaging of the chest, abdomen and pelvis was performed following the standard protocol during bolus administration of intravenous contrast. CONTRAST:  100m73mOVUE-300 IOPAMIDOL (ISOVUE-300) INJECTION 61% COMPARISON:  None. FINDINGS: CT CHEST FINDINGS Cardiovascular: No acute findings. Aortic and coronary artery  atherosclerosis. Mediastinum/Lymph Nodes: 2.9 cm soft tissue mass in upper outer quadrant right breast is consistent with known primary breast carcinoma. Right axillary and subpectoral lymphadenopathy is seen, with largest lymph node measuring 3.2 cm in the right axilla on image 18/2. No mediastinal or hilar lymphadenopathy. Lungs/Pleura: Scattered thin wall cysts are seen bilaterally. Mild scarring is seen in the medial right lower lobe. A few scattered tiny pulmonary nodular densities are seen in both lungs measuring 5 mm or less. These are nonspecific and bladder statistically more likely to be postinflammatory etiology rather than early pulmonary metastases. No evidence of pulmonary  infiltrate or pleural effusion. Musculoskeletal:  No suspicious bone lesions identified. CT ABDOMEN AND PELVIS FINDINGS Hepatobiliary: No masses identified. Mild hepatic steatosis. Gallbladder is unremarkable. Pancreas:  No mass or inflammatory changes. Spleen:  Within normal limits in size and appearance. Adrenals/Urinary tract:  No masses or hydronephrosis. Stomach/Bowel: No evidence of obstruction, inflammatory process, or abnormal fluid collections. Vascular/Lymphatic: No pathologically enlarged lymph nodes identified. No abdominal aortic aneurysm. Aortic atherosclerosis. Reproductive: Prior hysterectomy noted. Adnexal regions are unremarkable in appearance. Other:  None. Musculoskeletal:  No suspicious bone lesions identified. IMPRESSION: 2.9 cm right upper outer quadrant breast mass, consistent with known primary breast carcinoma. Right axillary and subpectoral lymphadenopathy, consistent with metastatic disease. Tiny sub-cm bilateral pulmonary nodules are indeterminate, but favor postinflammatory etiology over early metastases. Recommend continued attention on follow-up CT. No evidence of metastatic disease within the abdomen or pelvis. Electronically Signed   By: Earle Gell M.D.   On: 07/21/2016 15:05   Dg Chest Port 1 View  Result Date: 07/23/2016 CLINICAL DATA:  Port-A-Cath placement EXAM: PORTABLE CHEST 1 VIEW COMPARISON:  Portable exam 1055 hours compared to 09/24/2008 FINDINGS: LEFT subclavian Port-A-Cath with tip projecting over SVC. Decreased lung volumes with rotation to the RIGHT. Grossly normal heart size, mediastinal contours and pulmonary vascularity. Minimal bibasilar atelectasis. Lungs otherwise clear. No pleural effusion or pneumothorax. Atherosclerotic calcification aortic arch. IMPRESSION: No pneumothorax following Port-A-Cath placement. Minimal bibasilar atelectasis. Electronically Signed   By: Lavonia Dana M.D.   On: 07/23/2016 11:03   Dg Fluoro Guide Cv Line-no Report  Result  Date: 07/23/2016 There is no Radiologist interpretation  for this exam.     IMPRESSION/PLAN: Stage T2N1aM0, Stage IIB Right Breast Invasive Ductal Carcinoma, ER+/PR-, Grade 3.  The consensus is that she would be a good candidate for breast conservation. I talked to her about the option of a mastectomy and informed her that her expected overall survival would be equivalent between mastectomy and breast conservation, based upon randomized controlled data. She is enthusiastic about breast conservation.  It was a pleasure meeting the patient today. We discussed the risks, benefits, and side effects of radiotherapy. I recommend radiotherapy to the Right Breast and regional nodes to reduce her risk of locoregional recurrence by 2/3.  We discussed that radiation would take approximately 6-7 weeks to complete and that I would give the patient a few weeks to heal following surgery before starting treatment planning. If chemotherapy were to be given, this would precede radiotherapy. We spoke about acute effects including skin irritation and fatigue as well as much less common late effects including internal organ injury or irritation. We spoke about the latest technology that is used to minimize the risk of late effects for patients undergoing radiotherapy to the breast or chest wall. No guarantees of treatment were given. The patient is enthusiastic about proceeding with treatment. I look forward to participating in the patient's care.  At this time, the patient is scheduled for neoadjuvant chemotherapy with subsequent lumpectomy. I will await her referral back to me for postoperative follow-up and eventual CT simulation/treatment planning.  I discussed the patient's recent constipation. I advised her to try a Fleet enema or Magnesium Citrate to relieve this. Additionally, if this does not relieve the patient's constipation by tomorrow I advised her to contact the cancer center. If the patient is still having  persistent headaches over the next few weeks (they have been present for only 72 hours) I advised her to discuss this with Dr. Whitney Muse as well. She may take Tylenol for occasional headaches as needed.   __________________________________________   Eppie Gibson, MD   This document serves as a record of services personally performed by Eppie Gibson, MD. It was created on her behalf by Maryla Morrow, a trained medical scribe. The creation of this record is based on the scribe's personal observations and the provider's statements to them. This document has been checked and approved by the attending provider.

## 2016-07-30 ENCOUNTER — Encounter (HOSPITAL_COMMUNITY)
Admit: 2016-07-30 | Discharge: 2016-07-30 | Disposition: A | Discharge status: Home / Self Care | Payer: No Typology Code available for payment source | Attending: General Surgery | Admitting: General Surgery

## 2016-07-30 ENCOUNTER — Encounter (HOSPITAL_COMMUNITY)
Admission: RE | Admit: 2016-07-30 | Discharge: 2016-07-30 | Disposition: A | Discharge status: Home / Self Care | Payer: No Typology Code available for payment source | Source: Ambulatory Visit | Attending: General Surgery | Admitting: General Surgery

## 2016-07-30 DIAGNOSIS — C50411 Malignant neoplasm of upper-outer quadrant of right female breast: Secondary | ICD-10-CM | POA: Insufficient documentation

## 2016-07-30 MED ORDER — TECHNETIUM TC 99M MEDRONATE IV KIT
25.0000 | PACK | Freq: Once | INTRAVENOUS | Status: AC | PRN
  Administered 2016-07-30: 20 via INTRAVENOUS

## 2016-07-31 ENCOUNTER — Encounter (HOSPITAL_COMMUNITY): Payer: Self-pay | Admitting: Adult Health

## 2016-07-31 ENCOUNTER — Encounter (HOSPITAL_BASED_OUTPATIENT_CLINIC_OR_DEPARTMENT_OTHER): Payer: PRIVATE HEALTH INSURANCE | Admitting: Adult Health

## 2016-07-31 ENCOUNTER — Encounter (HOSPITAL_COMMUNITY): Payer: PRIVATE HEALTH INSURANCE

## 2016-07-31 ENCOUNTER — Ambulatory Visit (HOSPITAL_COMMUNITY): Payer: Self-pay | Admitting: Hematology & Oncology

## 2016-07-31 VITALS — BP 98/60 | HR 87 | Temp 97.8°F | Resp 16 | Wt 184.0 lb

## 2016-07-31 DIAGNOSIS — D709 Neutropenia, unspecified: Secondary | ICD-10-CM

## 2016-07-31 DIAGNOSIS — E119 Type 2 diabetes mellitus without complications: Secondary | ICD-10-CM

## 2016-07-31 DIAGNOSIS — Z170 Estrogen receptor positive status [ER+]: Principal | ICD-10-CM

## 2016-07-31 DIAGNOSIS — C50411 Malignant neoplasm of upper-outer quadrant of right female breast: Secondary | ICD-10-CM

## 2016-07-31 DIAGNOSIS — E876 Hypokalemia: Secondary | ICD-10-CM

## 2016-07-31 LAB — COMPREHENSIVE METABOLIC PANEL
ALBUMIN: 3.3 g/dL — AB (ref 3.5–5.0)
ALT: 17 U/L (ref 14–54)
AST: 19 U/L (ref 15–41)
Alkaline Phosphatase: 94 U/L (ref 38–126)
Anion gap: 9 (ref 5–15)
BUN: 17 mg/dL (ref 6–20)
CALCIUM: 9 mg/dL (ref 8.9–10.3)
CO2: 26 mmol/L (ref 22–32)
CREATININE: 0.97 mg/dL (ref 0.44–1.00)
Chloride: 100 mmol/L — ABNORMAL LOW (ref 101–111)
GFR calc Af Amer: >60 mL/min (ref >60)
GFR calc non Af Amer: >60 mL/min (ref >60)
GLUCOSE: 167 mg/dL — AB (ref 65–99)
Potassium: 3.4 mmol/L — ABNORMAL LOW (ref 3.5–5.1)
SODIUM: 135 mmol/L (ref 135–145)
Total Bilirubin: 0.5 mg/dL (ref 0.3–1.2)
Total Protein: 7 g/dL (ref 6.5–8.1)

## 2016-07-31 LAB — CBC WITH DIFFERENTIAL/PLATELET
BASOS ABS: 0 10*3/uL (ref 0.0–0.1)
BASOS PCT: 2 %
EOS ABS: 0.1 10*3/uL (ref 0.0–0.7)
Eosinophils Relative: 3 %
HCT: 35.8 % — ABNORMAL LOW (ref 36.0–46.0)
HEMOGLOBIN: 12.4 g/dL (ref 12.0–15.0)
LYMPHS PCT: 52 %
Lymphs Abs: 1 10*3/uL (ref 0.7–4.0)
MCH: 29.6 pg (ref 26.0–34.0)
MCHC: 34.6 g/dL (ref 30.0–36.0)
MCV: 85.4 fL (ref 78.0–100.0)
Monocytes Absolute: 0.2 10*3/uL (ref 0.1–1.0)
Monocytes Relative: 10 %
NEUTROS PCT: 33 %
Neutro Abs: 0.6 10*3/uL — ABNORMAL LOW (ref 1.7–7.7)
Platelets: 178 10*3/uL (ref 150–400)
RBC: 4.19 MIL/uL (ref 3.87–5.11)
RDW: 12.2 % (ref 11.5–15.5)
WBC: 1.9 10*3/uL — ABNORMAL LOW (ref 4.0–10.5)

## 2016-07-31 MED ORDER — POTASSIUM CHLORIDE CRYS ER 20 MEQ PO TBCR: 20.0000 meq | EXTENDED_RELEASE_TABLET | Freq: Every day | ORAL | 0 refills | Status: DC

## 2016-07-31 NOTE — Patient Instructions (Signed)
[place] at [facility] Discharge Instructions  RECOMMENDATIONS MADE BY THE CONSULTANT AND ANY TEST RESULTS WILL BE SENT TO YOUR REFERRING PHYSICIAN.  You were seen today by Mike Craze NP. Start taking Miralax once daily. Potassium once daily sent to your pharmacy. Return as scheduled.   Thank you for choosing [place] at [facility] to provide your oncology and hematology care.  To afford each patient quality time with our provider, please arrive at least 15 minutes before your scheduled appointment time.    If you have a lab appointment with the [facility] please come in thru the  Main Entrance and check in at the main information desk  You need to re-schedule your appointment should you arrive 10 or more minutes late.  We strive to give you quality time with our providers, and arriving late affects you and other patients whose appointments are after yours.  Also, if you no show three or more times for appointments you may be dismissed from the clinic at the providers discretion.     Again, thank you for choosing [facility].  Our hope is that these requests will decrease the amount of time that you wait before being seen by our physicians.       _____________________________________________________________  Should you have questions after your visit to [facility], please contact our office at [phone] between the hours of 8:30 a.m. and 4:30 p.m.  Voicemails left after 4:30 p.m. will not be returned until the following business day.  For prescription refill requests, have your pharmacy contact our office.       Resources For Cancer Patients and their Caregivers ? American Cancer Society: Can assist with transportation, wigs, general needs, runs Look Good Feel Better.        (423)880-2288 ? Cancer Care: Provides financial assistance, online support groups, medication/co-pay assistance.   1-800-813-HOPE 512-423-3861) ? [facility] Assists [place] cancer patients and their families through emotional , educational and financial support.  620-690-6524 ? [place] DSS Where to apply for food stamps, Medicaid and utility assistance. 5302397235 ? [facility]: Transportation to medical appointments. (813) 788-5613 ? Social Security Administration: May apply for disability if have a Stage IV cancer. 253-735-9594 (713)833-5325 ? [employer], Disability and Transit Services: Assists with nutrition, care and transit needs. [place] Support Programs: @10RELATIVEDAYS @ > Cancer Support Group  2nd Tuesday of the month 1pm-2pm, Journey Room  > Creative Journey  3rd Tuesday of the month 1130am-1pm, Journey Room  > Look Good Feel Better  1st Wednesday of the month 10am-12 noon, Journey Room (Call [facility] to register [phone])

## 2016-07-31 NOTE — Progress Notes (Signed)
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, ER+/PR-/HER2-  CURRENT THERAPY: Neoadjuvant chemotherapy with Adriamycin/Cytoxan  BRIEF ONCOLOGIC HISTORY:    Breast cancer of upper-outer quadrant of right female breast ([place])   06/24/2016 Mammogram    Diagnostic bilateral mammogram: highly suspicious 3.5 cm mass in the UO R breast with marked enlarged R axillary LN      07/01/2016 Pathology Results    Infiltrating ductal carcinoma ER+ 80%, PR-, HER 2-, Ki67 90%      07/01/2016 Initial Biopsy    Ultrasound guided R breast biopsy and R axillary LN biopsy both were clipped post biopsy      07/24/2016 -  Neo-Adjuvant Chemotherapy    Adriamycin/Cytoxan (current therapy)       07/30/2016 Imaging    Bone scan: Negative for osseous disease .        HISTORY OF PRESENT ILLNESS:  (From Dr. Donald Pore most recent note on 07/14/16)    INTERVAL HISTORY:  She saw Dr. Isidore Moos on 07/28/16 for initial radiation oncology consultation.  She had bone scan on 07/30/16, which was negative for osseous mets.    She presents today with her family.  She received cycle #1 Adriamycin/Cytoxan last week on 07/24/16, with Neulasta support. She overall feels well. She has some fatigue.She had some nausea after her last treatment, "but the pill helped me"; she took Compazine.  Denies fever, chills, cough, shortness of breath, or diarrhea. She endorses some constipation, particularly after taking pain medications.  She tried drinking prune juice and took some stool softeners from the [employer], but they were not effective.  She then took a dose of magnesium citrate and had results.  Her appetite is okay; "I'm alright as long as I don't smell the food cooking."  She has been eating corn and cream  potatoes most recently.  Denies any vomiting.   Dr. Arsenio Katz in [place] helps manage her blood sugars.  [name] tells me she had some blood sugar values in the 400s since her chemo; subsequent blood sugars have been in the 200s.    He denies any new headaches or dizziness.    REVIEW OF SYSTEMS:  Review of Systems  Constitutional: Positive for fatigue. Negative for chills and fever.  HENT:  Negative.  Negative for mouth sores.   Eyes: Negative.   Respiratory: Negative.  Negative for cough and shortness of breath.   Cardiovascular: Negative.  Negative for chest pain and palpitations.  Gastrointestinal: Positive for constipation and nausea.  Endocrine: Negative.   Genitourinary: Negative.  Negative for hematuria.   Neurological: Negative.  Negative for dizziness and headaches.  Hematological: Negative.   Psychiatric/Behavioral: Negative.    A 14 point review of systems was completed and otherwise negative except as noted above.  PAST MEDICAL/SURGICAL HISTORY:  Past Medical History:  Diagnosis Date  . Arthritis   . Cancer ([place])   . Cough    dry  . Diabetes mellitus without complication ([place])   . Hypertension   . Ruptured eardrum    left    Past Surgical History:  Procedure Laterality Date  . ABDOMINAL HYSTERECTOMY     with bilateral bso  . COLONOSCOPY    . PORTACATH PLACEMENT Left 07/23/2016   Procedure: INSERTION PORT-A-CATH;  Surgeon: Stark Klein,  MD;  Location: [facility];  Service: General;  Laterality: Left;     SOCIAL HISTORY:  Social History   Social History  . Marital status: Single    Spouse name: N/A  . Number of children: N/A  . Years of education: N/A   Occupational History  . Not on file.   Social History Main Topics  . Smoking status: Former Smoker    Types: Cigarettes  . Smokeless tobacco: Never Used  . Alcohol use No  . Drug use: No  . Sexual activity: Not on file   Other Topics Concern  . Not on file   Social History  Narrative  . No narrative on file    FAMILY HISTORY:  No family history on file.  CURRENT MEDICATIONS:  Outpatient Encounter Prescriptions as of 07/31/2016  Medication Sig Note  . amLODipine (NORVASC) 10 MG tablet Take 10 mg by mouth daily.   [name] aspirin EC 81 MG tablet Take 81 mg by mouth daily.   [name] atorvastatin (LIPITOR) 10 MG tablet Take 10 mg by mouth every evening.   . glyBURIDE-metformin (GLUCOVANCE) 5-500 MG tablet Take 1 tablet by mouth 2 (two) times daily.   . hydrochlorothiazide (HYDRODIURIL) 25 MG tablet Take 25 mg by mouth daily.   [name] ibuprofen (ADVIL,MOTRIN) 800 MG tablet Take 800 mg by mouth every 8 (eight) hours as needed.   . lidocaine-prilocaine (EMLA) cream Apply a quarter size amount to affected area 1 hour prior to coming to chemotherapy.   [name] lisinopril (PRINIVIL,ZESTRIL) 40 MG tablet Take 40 mg by mouth daily.   . ondansetron (ZOFRAN) 8 MG tablet Take 1 tablet (8 mg total) by mouth every 8 (eight) hours as needed for nausea or vomiting. 07/20/2016: Have not started med yet  . oxyCODONE (OXY IR/ROXICODONE) 5 MG immediate release tablet Take 1-2 tablets (5-10 mg total) by mouth every 6 (six) hours as needed for moderate pain, severe pain or breakthrough pain.   [name] prochlorperazine (COMPAZINE) 10 MG tablet Take 1 tablet (10 mg total) by mouth every 6 (six) hours as needed for nausea or vomiting. 07/20/2016: Have not started med yet   . sitaGLIPtin (JANUVIA) 100 MG tablet Take 100 mg by mouth daily.    No facility-administered encounter medications on file as of 07/31/2016.     ALLERGIES:  No Known Allergies   PHYSICAL EXAM:  ECOG Performance status: 1 - Symptomatic, but largely independent.   Physical Exam  Constitutional: She is oriented to person, place, and time and well-developed, well-nourished, and in no distress.  HENT:  Head: Normocephalic and atraumatic.  Mouth/Throat: Oropharynx is clear and moist.  Eyes: Pupils are equal, round, and reactive to light. Right  eye exhibits no discharge. Left eye exhibits no discharge. No scleral icterus.  Neck: Normal range of motion. Neck supple.  Cardiovascular: Normal rate and regular rhythm.   Pulmonary/Chest: Effort normal and breath sounds normal.  Abdominal: Soft. She exhibits no distension. There is no tenderness.  Hypoactive bowel sounds   Musculoskeletal: Normal range of motion. She exhibits no edema.  Lymphadenopathy:    She has no cervical adenopathy.  Neurological: She is alert and oriented to person, place, and time. No cranial nerve deficit. Gait normal.  Skin: Skin is warm and dry. No rash noted.  Psychiatric: Mood, memory, affect and judgment normal.     LABORATORY DATA:  I have reviewed the labs as listed.  CBC    Component Value Date/Time   WBC 1.9 (L) 07/31/2016  0845   RBC 4.19 07/31/2016 0845   HGB 12.4 07/31/2016 0845   HCT 35.8 (L) 07/31/2016 0845   PLT 178 07/31/2016 0845   MCV 85.4 07/31/2016 0845   MCH 29.6 07/31/2016 0845   MCHC 34.6 07/31/2016 0845   RDW 12.2 07/31/2016 0845   LYMPHSABS 1.0 07/31/2016 0845   MONOABS 0.2 07/31/2016 0845   EOSABS 0.1 07/31/2016 0845   BASOSABS 0.0 07/31/2016 0845   CMP Latest Ref Rng & Units 07/31/2016 07/24/2016 07/15/2016  Glucose 65 - 99 mg/dL 167(H) 231(H) 95  BUN 6 - 20 mg/dL _0 Creatinine 0.44 - 1.00 mg/dL 0.97 0.84 0.99  Sodium 135 - 145 mmol/L 135 138 138  Potassium 3.5 - 5.1 mmol/L 3.4(L) 3.5 3.0(L)  Chloride 101 - 111 mmol/L 100(L) 103 102  CO2 22 - 32 mmol/L _1 Calcium 8.9 - 10.3 mg/dL 9.0 9.3 9.7  Total Protein 6.5 - 8.1 g/dL 7.0 7.0 7.7  Total Bilirubin 0.3 - 1.2 mg/dL 0.5 0.6 0.5  Alkaline Phos 38 - 126 U/L 94 77 89  AST 15 - 41 U/L _2 ALT 14 - 54 U/L _3 PENDING LABS:  None.   DIAGNOSTIC IMAGING:  Bone scan: 07/30/16   PATHOLOGY:       ASSESSMENT & PLAN:   Stage IIB left breast invasive ductal carcinoma, ER+/PR-/HER2-:  -Ms. Hopkins is a pleasant 61 year old female with stage  IIB left breast invasive ductal carcinoma, ER positive/PR negative/HER-2 negative. She has completed cycle 1 of neoadjuvant chemotherapy with Adriamycin/Cytoxan with overall good tolerance. She will proceed with cycle #2 next week on 08/07/16. Encouraged her to eat foods high in protein during the course of treatment; we discussed specific examples of food she may tolerate well.  Constipation, likely secondary to opiate use:  -Recommended she start taking MiraLAX daily, to prevent constipation secondary to opiate use. Drinking plenty of non-caffeinated fluids and increasing physical activity, as tolerated, may be helpful as well.   Hypokalemia:  -Her serum potassium is mildly low today at 3.4. This is likely secondary to her appetite change is a result of chemotherapy. I will start her on K-Dur 20 mEq daily and we will recheck CMET labs before her next chemotherapy next week.  Also encouraged her to eat potassium rich foods and gave him specific examples for her to try.  Hyperglycemia:  -Endorses elevated blood sugars to the 400s about 4-5 days after chemotherapy. Dr. Arsenio [place], her PCP, has previously been managing her diabetes. Her elevated blood sugars are likely secondary to steroid premedications with chemotherapy. We will discussed the use of initiating sliding scale Humalog insulin with her next chemotherapy cycle.   Neutropenia:  -As expected. She is asymptomatic and afebrile.  Will continue Neulasta support.  She knows to call us or report to ER with any fevers or chills.      Dispo:  -Return to cancer center on 08/07/16 for Cycle #2 [facility] as scheduled.    All questions were answered to patient's stated satisfaction. Encouraged patient to call with any new concerns or questions before her next visit to the cancer center and we can certain see her sooner, if needed.     Plan of care discussed with Dr. Whitney Muse, who agrees with the above aforementioned.    Mike Craze,  NP [place] [phone]

## 2016-08-02 ENCOUNTER — Encounter (HOSPITAL_COMMUNITY): Payer: Self-pay | Admitting: Hematology & Oncology

## 2016-08-07 ENCOUNTER — Encounter (HOSPITAL_COMMUNITY): Payer: No Typology Code available for payment source | Attending: Hematology & Oncology

## 2016-08-07 ENCOUNTER — Encounter (HOSPITAL_BASED_OUTPATIENT_CLINIC_OR_DEPARTMENT_OTHER): Payer: No Typology Code available for payment source | Admitting: Oncology

## 2016-08-07 ENCOUNTER — Encounter (HOSPITAL_COMMUNITY): Payer: Self-pay | Admitting: Oncology

## 2016-08-07 VITALS — BP 132/52 | HR 88 | Temp 98.2°F | Resp 16 | Wt 185.6 lb

## 2016-08-07 DIAGNOSIS — Z170 Estrogen receptor positive status [ER+]: Secondary | ICD-10-CM | POA: Diagnosis not present

## 2016-08-07 DIAGNOSIS — Z01818 Encounter for other preprocedural examination: Secondary | ICD-10-CM | POA: Diagnosis not present

## 2016-08-07 DIAGNOSIS — Z5111 Encounter for antineoplastic chemotherapy: Secondary | ICD-10-CM

## 2016-08-07 DIAGNOSIS — C50411 Malignant neoplasm of upper-outer quadrant of right female breast: Secondary | ICD-10-CM | POA: Diagnosis not present

## 2016-08-07 DIAGNOSIS — I10 Essential (primary) hypertension: Secondary | ICD-10-CM

## 2016-08-07 LAB — COMPREHENSIVE METABOLIC PANEL
ALT: 28 U/L (ref 14–54)
AST: 20 U/L (ref 15–41)
Albumin: 3.5 g/dL (ref 3.5–5.0)
Alkaline Phosphatase: 105 U/L (ref 38–126)
Anion gap: 9 (ref 5–15)
BUN: 15 mg/dL (ref 6–20)
CHLORIDE: 105 mmol/L (ref 101–111)
CO2: 24 mmol/L (ref 22–32)
Calcium: 9.2 mg/dL (ref 8.9–10.3)
Creatinine, Ser: 0.88 mg/dL (ref 0.44–1.00)
GFR calc Af Amer: >60 mL/min (ref >60)
GFR calc non Af Amer: >60 mL/min (ref >60)
GLUCOSE: 167 mg/dL — AB (ref 65–99)
POTASSIUM: 3.5 mmol/L (ref 3.5–5.1)
Sodium: 138 mmol/L (ref 135–145)
Total Bilirubin: 0.4 mg/dL (ref 0.3–1.2)
Total Protein: 6.8 g/dL (ref 6.5–8.1)

## 2016-08-07 LAB — CBC WITH DIFFERENTIAL/PLATELET
BASOS ABS: 0 10*3/uL (ref 0.0–0.1)
BASOS PCT: 0 %
EOS PCT: 0 %
Eosinophils Absolute: 0 10*3/uL (ref 0.0–0.7)
HCT: 36.7 % (ref 36.0–46.0)
Hemoglobin: 12.6 g/dL (ref 12.0–15.0)
Lymphocytes Relative: 21 %
Lymphs Abs: 1.8 10*3/uL (ref 0.7–4.0)
MCH: 29.2 pg (ref 26.0–34.0)
MCHC: 34.3 g/dL (ref 30.0–36.0)
MCV: 85.2 fL (ref 78.0–100.0)
MONO ABS: 0.7 10*3/uL (ref 0.1–1.0)
Monocytes Relative: 8 %
NEUTROS ABS: 6.3 10*3/uL (ref 1.7–7.7)
Neutrophils Relative %: 71 %
PLATELETS: 153 10*3/uL (ref 150–400)
RBC: 4.31 MIL/uL (ref 3.87–5.11)
RDW: 12.4 % (ref 11.5–15.5)
WBC: 8.9 10*3/uL (ref 4.0–10.5)

## 2016-08-07 MED ORDER — HEPARIN SOD (PORK) LOCK FLUSH 100 UNIT/ML IV SOLN
500.0000 [IU] | Freq: Once | INTRAVENOUS | Status: AC | PRN
  Administered 2016-08-07: 500 [IU]
  Filled 2016-08-07: qty 5

## 2016-08-07 MED ORDER — DOXORUBICIN HCL CHEMO IV INJECTION 2 MG/ML
60.0000 mg/m2 | Freq: Once | INTRAVENOUS | Status: AC
Start: 2016-08-07 — End: 2016-08-07
  Administered 2016-08-07: 118 mg via INTRAVENOUS
  Filled 2016-08-07: qty 59

## 2016-08-07 MED ORDER — SODIUM CHLORIDE 0.9 % IV SOLN
Freq: Once | INTRAVENOUS | Status: AC
  Administered 2016-08-07: 12:00:00 via INTRAVENOUS
  Filled 2016-08-07: qty 5

## 2016-08-07 MED ORDER — SODIUM CHLORIDE 0.9 % IV SOLN
600.0000 mg/m2 | Freq: Once | INTRAVENOUS | Status: AC
  Administered 2016-08-07: 1180 mg via INTRAVENOUS
  Filled 2016-08-07: qty 25

## 2016-08-07 MED ORDER — PALONOSETRON HCL INJECTION 0.25 MG/5ML
INTRAVENOUS | Status: AC
  Filled 2016-08-07: qty 5

## 2016-08-07 MED ORDER — SODIUM CHLORIDE 0.9 % IV SOLN
Freq: Once | INTRAVENOUS | Status: AC
  Administered 2016-08-07: 11:00:00 via INTRAVENOUS

## 2016-08-07 MED ORDER — PALONOSETRON HCL INJECTION 0.25 MG/5ML
0.2500 mg | Freq: Once | INTRAVENOUS | Status: AC
  Administered 2016-08-07: 0.25 mg via INTRAVENOUS

## 2016-08-07 MED ORDER — SODIUM CHLORIDE 0.9% FLUSH
10.0000 mL | INTRAVENOUS | Status: DC | PRN
  Administered 2016-08-07: 10 mL
  Filled 2016-08-07: qty 10

## 2016-08-07 MED ORDER — PEGFILGRASTIM 6 MG/0.6ML ~~LOC~~ PSKT
6.0000 mg | PREFILLED_SYRINGE | Freq: Once | SUBCUTANEOUS | Status: AC
  Administered 2016-08-07: 6 mg via SUBCUTANEOUS
  Filled 2016-08-07: qty 0.6

## 2016-08-07 NOTE — Progress Notes (Signed)
Chemotherapy given today per orders, patient tolerated it well. Vitals stable. Marland KitchenAlger Memos arrived today for [facility] neulasta on body injector. See MAR for administration details. Injector in place and engaged with green light indicator on flashing. Tolerated application with out problems.  Discharged from clinic ambulatory with family at side. Follow up as scheduled.

## 2016-08-07 NOTE — Assessment & Plan Note (Addendum)
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  On Neoadjuvant chemotherapy consisting of AC followed by T beginning on 07/24/2016.  Oncology history is updated.  Pre-treatment labs today: CBC diff, CMET.  I personally reviewed and went over laboratory results with the patient.  The results are noted within this dictation.  She is tolerating treatment well.  She denies any side effects or complaints.  She is educated on chemotherapy-induced alopecia.  She is provided OTC options to help with sleep.  If ineffective, I would try a Rx-strength sleeping aid.  I have recommended Benadryl, Tylenol PM, Melatonin, etc.  Return in 2 weeks for follow-up and cycle # 3 of AC with a breast exam to evaluate clinical response.

## 2016-08-07 NOTE — Patient Instructions (Signed)
[facility] Discharge Instructions for Patients Receiving Chemotherapy   Beginning January 23rd 2017 lab work for the Cancer Center will be done in the  Main lab at  on 1st floor. If you have a lab appointment with the Cancer Center please come in thru the  Main Entrance and check in at the main information desk   Today you received the following chemotherapy agents   To help prevent nausea and vomiting after your treatment, we encourage you to take your nausea medication     If you develop nausea and vomiting, or diarrhea that is not controlled by your medication, call the clinic.  The clinic phone number is [phone]. Office hours are Monday-Friday 8:30am-5:00pm.  BELOW ARE SYMPTOMS THAT SHOULD BE REPORTED IMMEDIATELY:  *FEVER GREATER THAN 101.0 F  *CHILLS WITH OR WITHOUT FEVER  NAUSEA AND VOMITING THAT IS NOT CONTROLLED WITH YOUR NAUSEA MEDICATION  *UNUSUAL SHORTNESS OF BREATH  *UNUSUAL BRUISING OR BLEEDING  TENDERNESS IN MOUTH AND THROAT WITH OR WITHOUT PRESENCE OF ULCERS  *URINARY PROBLEMS  *BOWEL PROBLEMS  UNUSUAL RASH Items with * indicate a potential emergency and should be followed up as soon as possible. If you have an emergency after office hours please contact your primary care physician or go to the nearest emergency department.  Please call the clinic during office hours if you have any questions or concerns.   You may also contact the Patient Navigator at [phone] should you have any questions or need assistance in obtaining follow up care.      Resources For Cancer Patients and their Caregivers ? American Cancer Society: Can assist with transportation, wigs, general needs, runs Look Good Feel Better.        1-888-227-6333 ? Cancer Care: Provides financial assistance, online support groups, medication/co-pay assistance.  1-800-813-HOPE (4673) ? [facility] Assists [place] cancer  patients and their families through emotional , educational and financial support.  336-427-4357 ? [place] DSS Where to apply for food stamps, Medicaid and utility assistance. 336-342-1394 ? [facility]: Transportation to medical appointments. 336-347-2287 ? Social Security Administration: May apply for disability if have a Stage IV cancer. 336-342-7796 1-800-772-1213 ? [place] Aging, Disability and Transit Services: Assists with nutrition, care and transit needs. 336-349-2343         

## 2016-08-07 NOTE — Patient Instructions (Signed)
[place] at [facility] Discharge Instructions  RECOMMENDATIONS MADE BY THE CONSULTANT AND ANY TEST RESULTS WILL BE SENT TO YOUR REFERRING PHYSICIAN.  Congratulations on finishing your 2nd cycle of chemotherapy.  For sleep, you are welcome to try an over the counter medication:  Benadryl 25-50 mg at bed time  Tylenol PM  Melatonin   Unisom If sleeping aids are ineffective, please let us know. Return in 2 weeks for follow-up and cycle #3 of treatment. Call with any questions or concerns regarding your breast cancer.  Thank you for choosing [place] at [facility] to provide your oncology and hematology care.  To afford each patient quality time with our provider, please arrive at least 15 minutes before your scheduled appointment time.    If you have a lab appointment with the [facility] please come in thru the  Main Entrance and check in at the main information desk  You need to re-schedule your appointment should you arrive 10 or more minutes late.  We strive to give you quality time with our providers, and arriving late affects you and other patients whose appointments are after yours.  Also, if you no show three or more times for appointments you may be dismissed from the clinic at the providers discretion.     Again, thank you for choosing [facility].  Our hope is that these requests will decrease the amount of time that you wait before being seen by our physicians.       _____________________________________________________________  Should you have questions after your visit to [facility], please contact our office at [phone] between the hours of 8:30 a.m. and 4:30 p.m.  Voicemails left after 4:30 p.m. will not be returned until the following business day.  For prescription refill requests, have your pharmacy contact our office.       Resources For Cancer Patients and their  Caregivers ? American Cancer Society: Can assist with transportation, wigs, general needs, runs Look Good Feel Better.        (641)718-0568 ? Cancer Care: Provides financial assistance, online support groups, medication/co-pay assistance.  1-800-813-HOPE 680-878-5700) ? [facility] Assists [place] cancer patients and their families through emotional , educational and financial support.  562 213 1572 ? [place] DSS Where to apply for food stamps, Medicaid and utility assistance. 316-656-2668 ? RC[facility]: Transportation to medical appointments. 608-544-1193 ? Social Security Administration: May apply for disability if have a Stage IV cancer. 416-178-7986 2482674795 ? [employer], Disability and Transit Services: Assists with nutrition, care and transit needs. [facility] Support Programs: @10RELATIVEDAYS @ > Cancer Support Group  2nd Tuesday of the month 1pm-2pm, Journey Room  > Creative Journey  3rd Tuesday of the month 1130am-1pm, Journey Room  > Look Good Feel Better  1st Wednesday of the month 10am-12 noon, Journey Room (Call [place] to register [phone])

## 2016-08-07 NOTE — Progress Notes (Signed)
[facility], MD [place] [address]  Malignant neoplasm of upper-outer quadrant of right breast in female, estrogen receptor positive ([name])  CURRENT THERAPY: Neoadjuvant chemotherapy AC followed by T beginning on 07/24/2016.  INTERVAL HISTORY: [name] 61 y.o. 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    Breast cancer of upper-outer quadrant of right female breast ([name])   06/24/2016 Mammogram    Diagnostic bilateral mammogram: highly suspicious 3.5 cm mass in the UO R breast with marked enlarged R axillary LN      06/24/2016 Breast US    2.7 x 3.1 x 3.5 cm slightly irregular hypoechoic mass at the 10:00 position of right breast, 6 cm from the nipple.  4.1 x 6 cm right axillar lymph node also identified.      07/01/2016 Pathology Results    Infiltrating ductal carcinoma ER+ 80%, PR-, HER 2-, Ki67 90%      07/01/2016 Initial Biopsy    Ultrasound guided R breast biopsy and R axillary LN biopsy both were clipped post biopsy      07/20/2016 Imaging    MUGA- Upper normal left ventricular ejection fraction of 75% with normal LV wall motion.      07/21/2016 Imaging    CT CAP- 2.9 cm right upper outer quadrant breast mass, consistent with known primary breast carcinoma.  Right axillary and subpectoral lymphadenopathy, consistent with metastatic disease.  Tiny sub-cm bilateral pulmonary nodules are indeterminate, but favor postinflammatory etiology over early metastases. Recommend continued attention on follow-up CT.  No evidence of metastatic disease within the abdomen or pelvis.      07/23/2016 Procedure    Port placed by Dr. Barry Dienes      07/24/2016 -  Neo-Adjuvant Chemotherapy    Adriamycin/Cytoxan (current therapy)       07/30/2016 Imaging    Bone scan: Negative for osseous disease .       She is tolerating treatment well.  She admits to beginning to lose her hair from  chemotherapy.  She is advised that this will continue.  She will be alopecic in the next few days to weeks.  She denies any nausea or vomiting.  She denies any bowel complaints.  She denies diarrhea and constipation.  She notes issues with insomnia.  She has not tried any sleeping aids to date.  She reports that her breast mass has decreased in size.  Review of Systems  Constitutional: Negative.  Negative for chills, fever and weight loss.  HENT: Negative.   Eyes: Negative.   Respiratory: Negative.  Negative for cough.   Cardiovascular: Negative.  Negative for chest pain.  Gastrointestinal: Negative.  Negative for constipation, diarrhea, nausea and vomiting.  Genitourinary: Negative.  Negative for dysuria.  Musculoskeletal: Negative.  Negative for falls.  Skin: Negative.   Neurological: Negative.  Negative for weakness.  Psychiatric/Behavioral: The patient has insomnia.     Past Medical History:  Diagnosis Date  . Arthritis   . Cancer ([place])   . Cough    dry  . Diabetes mellitus without complication ([place])   . Hypertension   . Ruptured eardrum    left     Past Surgical History:  Procedure Laterality Date  . ABDOMINAL HYSTERECTOMY     with bilateral bso  . COLONOSCOPY    . PORTACATH PLACEMENT Left 07/23/2016   Procedure: INSERTION PORT-A-CATH;  Surgeon: Stark Klein, MD;  Location: [facility];  Service: General;  Laterality: Left;    History reviewed. No pertinent family history.  Social History   Social History  . Marital status: Single    Spouse name: N/A  . Number of children: N/A  . Years of education: N/A   Social History Main Topics  . Smoking status: Former Smoker    Types: Cigarettes  . Smokeless tobacco: Never Used  . Alcohol use No  . Drug use: No  . Sexual activity: Not Asked   Other Topics Concern  . None   Social History Narrative  . None     PHYSICAL EXAMINATION  ECOG PERFORMANCE STATUS: 1 - Symptomatic but completely  ambulatory  There were no vitals filed for this visit.  Vitals - 1 value per visit 12/06/9526  SYSTOLIC 413  DIASTOLIC 81  Pulse 89  Temperature 98.7  Respirations 18  Weight (lb) 185.6   GENERAL:alert, no distress, well nourished, well developed, comfortable, cooperative, obese, smiling and accompanied by her son, in chemo-recliner. SKIN: skin color, texture, turgor are normal, no rashes or significant lesions HEAD: Normocephalic, No masses, lesions, tenderness or abnormalities, hair loss EYES: normal, EOMI, Conjunctiva are pink and non-injected EARS: External ears normal OROPHARYNX:lips, buccal mucosa, and tongue normal and mucous membranes are moist  NECK: supple, trachea midline LYMPH:  no palpable lymphadenopathy BREAST:not examined LUNGS: clear to auscultation and percussion HEART: regular rate & rhythm, no murmurs, no gallops, S1 normal and S2 normal ABDOMEN:abdomen soft and normal bowel sounds BACK: Back symmetric, no curvature. EXTREMITIES:less then 2 second capillary refill, no joint deformities, effusion, or inflammation, no skin discoloration, no cyanosis  NEURO: alert & oriented x 3 with fluent speech, no focal motor/sensory deficits   LABORATORY DATA: CBC    Component Value Date/Time   WBC 8.9 08/07/2016 1017   RBC 4.31 08/07/2016 1017   HGB 12.6 08/07/2016 1017   HCT 36.7 08/07/2016 1017   PLT 153 08/07/2016 1017   MCV 85.2 08/07/2016 1017   MCH 29.2 08/07/2016 1017   MCHC 34.3 08/07/2016 1017   RDW 12.4 08/07/2016 1017   LYMPHSABS 1.8 08/07/2016 1017   MONOABS 0.7 08/07/2016 1017   EOSABS 0.0 08/07/2016 1017   BASOSABS 0.0 08/07/2016 1017      Chemistry      Component Value Date/Time   NA 138 08/07/2016 1017   K 3.5 08/07/2016 1017   CL 105 08/07/2016 1017   CO2 24 08/07/2016 1017   BUN 15 08/07/2016 1017   CREATININE 0.88 08/07/2016 1017      Component Value Date/Time   CALCIUM 9.2 08/07/2016 1017   ALKPHOS 105 08/07/2016 1017   AST 20  08/07/2016 1017   ALT 28 08/07/2016 1017   BILITOT 0.4 08/07/2016 1017        PENDING LABS:   RADIOGRAPHIC STUDIES:  Ct Chest W Contrast  Result Date: 07/21/2016 CLINICAL DATA:  Newly diagnosed right upper outer quadrant breast carcinoma. Staging. EXAM: CT CHEST, ABDOMEN, AND PELVIS WITH CONTRAST TECHNIQUE: Multidetector CT imaging of the chest, abdomen and pelvis was performed following the standard protocol during bolus administration of intravenous contrast. CONTRAST:  139m ISOVUE-300 IOPAMIDOL (ISOVUE-300) INJECTION 61% COMPARISON:  None. FINDINGS: CT CHEST FINDINGS Cardiovascular: No acute findings. Aortic and coronary artery atherosclerosis. Mediastinum/Lymph Nodes: 2.9 cm soft tissue mass in upper outer quadrant right breast is consistent with known primary breast carcinoma. Right axillary and subpectoral lymphadenopathy is seen, with largest lymph node measuring 3.2 cm in the right axilla on image 18/2. No mediastinal  or hilar lymphadenopathy. Lungs/Pleura: Scattered thin wall cysts are seen bilaterally. Mild scarring is seen in the medial right lower lobe. A few scattered tiny pulmonary nodular densities are seen in both lungs measuring 5 mm or less. These are nonspecific and bladder statistically more likely to be postinflammatory etiology rather than early pulmonary metastases. No evidence of pulmonary infiltrate or pleural effusion. Musculoskeletal:  No suspicious bone lesions identified. CT ABDOMEN AND PELVIS FINDINGS Hepatobiliary: No masses identified. Mild hepatic steatosis. Gallbladder is unremarkable. Pancreas:  No mass or inflammatory changes. Spleen:  Within normal limits in size and appearance. Adrenals/Urinary tract:  No masses or hydronephrosis. Stomach/Bowel: No evidence of obstruction, inflammatory process, or abnormal fluid collections. Vascular/Lymphatic: No pathologically enlarged lymph nodes identified. No abdominal aortic aneurysm. Aortic atherosclerosis. Reproductive:  Prior hysterectomy noted. Adnexal regions are unremarkable in appearance. Other:  None. Musculoskeletal:  No suspicious bone lesions identified. IMPRESSION: 2.9 cm right upper outer quadrant breast mass, consistent with known primary breast carcinoma. Right axillary and subpectoral lymphadenopathy, consistent with metastatic disease. Tiny sub-cm bilateral pulmonary nodules are indeterminate, but favor postinflammatory etiology over early metastases. Recommend continued attention on follow-up CT. No evidence of metastatic disease within the abdomen or pelvis. Electronically Signed   By: Earle Gell M.D.   On: 07/21/2016 15:05   Nm Bone Scan Whole Body  Result Date: 07/30/2016 CLINICAL DATA:  RIGHT breast cancer at upper outer quadrant, unspecified estrogen receptor status, assess for osseous metastasis EXAM: NUCLEAR MEDICINE WHOLE BODY BONE SCAN TECHNIQUE: Whole body anterior and posterior images were obtained approximately 3 hours after intravenous injection of radiopharmaceutical. RADIOPHARMACEUTICALS:  20 mCi Technetium-39mMDP IV COMPARISON:  None Correlation: CT chest abdomen pelvis 07/21/2016 FINDINGS: Minimally increased uptake at shoulders, LEFT elbow, knees, and RIGHT foot, typically degenerative. No abnormal osseous tracer accumulation identified which is suspicious for osseous metastatic disease. Expected urinary tract and soft tissue distribution of tracer. IMPRESSION: No scintigraphic evidence of osseous metastatic disease. Electronically Signed   By: MLavonia DanaM.D.   On: 07/30/2016 15:53   Nm Cardiac Muga Rest  Result Date: 07/20/2016 CLINICAL DATA:  Breast cancer, cardiotoxic chemotherapy EXAM: NUCLEAR MEDICINE CARDIAC BLOOD POOL IMAGING (MUGA) TECHNIQUE: Cardiac multi-gated acquisition was performed at rest following intravenous injection of Tc-94mabeled red blood cells. RADIOPHARMACEUTICALS:  20.4 mCi Tc-9922mrtechnetate in-vitro labeled autologous red blood cells IV COMPARISON:  None  FINDINGS: Upper normal LEFT ventricular ejection fraction of 75%. Normal LEFT ventricular wall motion. Patient was rhythmic during imaging. IMPRESSION: Upper normal left ventricular ejection fraction of 75% with normal LV wall motion. Electronically Signed   By: MarLavonia DanaD.   On: 07/20/2016 16:00   Ct Abdomen Pelvis W Contrast  Result Date: 07/21/2016 CLINICAL DATA:  Newly diagnosed right upper outer quadrant breast carcinoma. Staging. EXAM: CT CHEST, ABDOMEN, AND PELVIS WITH CONTRAST TECHNIQUE: Multidetector CT imaging of the chest, abdomen and pelvis was performed following the standard protocol during bolus administration of intravenous contrast. CONTRAST:  100m24mOVUE-300 IOPAMIDOL (ISOVUE-300) INJECTION 61% COMPARISON:  None. FINDINGS: CT CHEST FINDINGS Cardiovascular: No acute findings. Aortic and coronary artery atherosclerosis. Mediastinum/Lymph Nodes: 2.9 cm soft tissue mass in upper outer quadrant right breast is consistent with known primary breast carcinoma. Right axillary and subpectoral lymphadenopathy is seen, with largest lymph node measuring 3.2 cm in the right axilla on image 18/2. No mediastinal or hilar lymphadenopathy. Lungs/Pleura: Scattered thin wall cysts are seen bilaterally. Mild scarring is seen in the medial right lower lobe. A few scattered tiny pulmonary  nodular densities are seen in both lungs measuring 5 mm or less. These are nonspecific and bladder statistically more likely to be postinflammatory etiology rather than early pulmonary metastases. No evidence of pulmonary infiltrate or pleural effusion. Musculoskeletal:  No suspicious bone lesions identified. CT ABDOMEN AND PELVIS FINDINGS Hepatobiliary: No masses identified. Mild hepatic steatosis. Gallbladder is unremarkable. Pancreas:  No mass or inflammatory changes. Spleen:  Within normal limits in size and appearance. Adrenals/Urinary tract:  No masses or hydronephrosis. Stomach/Bowel: No evidence of obstruction,  inflammatory process, or abnormal fluid collections. Vascular/Lymphatic: No pathologically enlarged lymph nodes identified. No abdominal aortic aneurysm. Aortic atherosclerosis. Reproductive: Prior hysterectomy noted. Adnexal regions are unremarkable in appearance. Other:  None. Musculoskeletal:  No suspicious bone lesions identified. IMPRESSION: 2.9 cm right upper outer quadrant breast mass, consistent with known primary breast carcinoma. Right axillary and subpectoral lymphadenopathy, consistent with metastatic disease. Tiny sub-cm bilateral pulmonary nodules are indeterminate, but favor postinflammatory etiology over early metastases. Recommend continued attention on follow-up CT. No evidence of metastatic disease within the abdomen or pelvis. Electronically Signed   By: Earle Gell M.D.   On: 07/21/2016 15:05   Dg Chest Port 1 View  Result Date: 07/23/2016 CLINICAL DATA:  Port-A-Cath placement EXAM: PORTABLE CHEST 1 VIEW COMPARISON:  Portable exam 1055 hours compared to 09/24/2008 FINDINGS: LEFT subclavian Port-A-Cath with tip projecting over SVC. Decreased lung volumes with rotation to the RIGHT. Grossly normal heart size, mediastinal contours and pulmonary vascularity. Minimal bibasilar atelectasis. Lungs otherwise clear. No pleural effusion or pneumothorax. Atherosclerotic calcification aortic arch. IMPRESSION: No pneumothorax following Port-A-Cath placement. Minimal bibasilar atelectasis. Electronically Signed   By: Lavonia Dana M.D.   On: 07/23/2016 11:03   Dg Fluoro Guide Cv Line-no Report  Result Date: 07/23/2016 There is no Radiologist interpretation  for this exam.    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  On Neoadjuvant chemotherapy consisting of AC followed by T beginning on 07/24/2016.  Oncology history is updated.  Pre-treatment labs today:  CBC diff, CMET.  I personally reviewed and went over laboratory results with the patient.  The results are noted within this dictation.  She is tolerating treatment well.  She denies any side effects or complaints.  She is educated on chemotherapy-induced alopecia.  She is provided OTC options to help with sleep.  If ineffective, I would try a Rx-strength sleeping aid.  I have recommended Benadryl, Tylenol PM, Melatonin, etc.  Return in 2 weeks for follow-up and cycle # 3 of AC with a breast exam to evaluate clinical response.   ORDERS PLACED FOR THIS ENCOUNTER: No orders of the defined types were placed in this encounter.   MEDICATIONS PRESCRIBED THIS ENCOUNTER: No orders of the defined types were placed in this encounter.   THERAPY PLAN:  Continue with neoadjuvant chemotherapy.  All questions were answered. The patient knows to call the clinic with any problems, questions or concerns. We can certainly see the patient much sooner if necessary.  Patient and plan discussed with Dr. Twana First and she is in agreement with the aforementioned.   This note is electronically signed by: Doy Mince 08/07/2016 11:47 AM

## 2016-08-10 ENCOUNTER — Telehealth (HOSPITAL_COMMUNITY): Payer: Self-pay

## 2016-08-10 NOTE — Telephone Encounter (Signed)
Patient called with instruction of how to remove the Neulasta OnPro.  She states that it is empty and infused correctly, she just wasn't sure how to remove it.  I instructed her how to remove it and to throw it away, she verbalized understanding.

## 2016-08-12 ENCOUNTER — Encounter: Payer: Self-pay | Admitting: Radiation Oncology

## 2016-08-21 ENCOUNTER — Encounter (HOSPITAL_BASED_OUTPATIENT_CLINIC_OR_DEPARTMENT_OTHER): Payer: No Typology Code available for payment source

## 2016-08-21 ENCOUNTER — Ambulatory Visit (HOSPITAL_COMMUNITY): Payer: PRIVATE HEALTH INSURANCE | Admitting: Oncology

## 2016-08-21 ENCOUNTER — Encounter (HOSPITAL_BASED_OUTPATIENT_CLINIC_OR_DEPARTMENT_OTHER): Payer: No Typology Code available for payment source | Admitting: Adult Health

## 2016-08-21 VITALS — BP 104/68 | HR 109 | Temp 98.4°F | Resp 20 | Wt 182.4 lb

## 2016-08-21 VITALS — BP 118/76 | HR 96 | Temp 98.0°F | Resp 18

## 2016-08-21 DIAGNOSIS — Z5189 Encounter for other specified aftercare: Secondary | ICD-10-CM | POA: Diagnosis not present

## 2016-08-21 DIAGNOSIS — C50411 Malignant neoplasm of upper-outer quadrant of right female breast: Secondary | ICD-10-CM | POA: Diagnosis not present

## 2016-08-21 DIAGNOSIS — G4700 Insomnia, unspecified: Secondary | ICD-10-CM

## 2016-08-21 DIAGNOSIS — E876 Hypokalemia: Secondary | ICD-10-CM | POA: Diagnosis not present

## 2016-08-21 DIAGNOSIS — C773 Secondary and unspecified malignant neoplasm of axilla and upper limb lymph nodes: Secondary | ICD-10-CM

## 2016-08-21 DIAGNOSIS — R51 Headache: Secondary | ICD-10-CM

## 2016-08-21 DIAGNOSIS — Z5111 Encounter for antineoplastic chemotherapy: Secondary | ICD-10-CM

## 2016-08-21 DIAGNOSIS — Z01818 Encounter for other preprocedural examination: Secondary | ICD-10-CM | POA: Diagnosis not present

## 2016-08-21 DIAGNOSIS — R519 Headache, unspecified: Secondary | ICD-10-CM

## 2016-08-21 DIAGNOSIS — Z170 Estrogen receptor positive status [ER+]: Secondary | ICD-10-CM

## 2016-08-21 LAB — CBC WITH DIFFERENTIAL/PLATELET
BASOS PCT: 0 %
Basophils Absolute: 0 10*3/uL (ref 0.0–0.1)
EOS PCT: 0 %
Eosinophils Absolute: 0 10*3/uL (ref 0.0–0.7)
HCT: 32.4 % — ABNORMAL LOW (ref 36.0–46.0)
HEMOGLOBIN: 11.5 g/dL — AB (ref 12.0–15.0)
LYMPHS ABS: 1.1 10*3/uL (ref 0.7–4.0)
Lymphocytes Relative: 10 %
MCH: 29.7 pg (ref 26.0–34.0)
MCHC: 35.5 g/dL (ref 30.0–36.0)
MCV: 83.7 fL (ref 78.0–100.0)
Monocytes Absolute: 1.1 10*3/uL — ABNORMAL HIGH (ref 0.1–1.0)
Monocytes Relative: 10 %
NEUTROS PCT: 80 %
Neutro Abs: 8.9 10*3/uL — ABNORMAL HIGH (ref 1.7–7.7)
PLATELETS: 131 10*3/uL — AB (ref 150–400)
RBC: 3.87 MIL/uL (ref 3.87–5.11)
RDW: 12.4 % (ref 11.5–15.5)
WBC: 11.1 10*3/uL — AB (ref 4.0–10.5)

## 2016-08-21 LAB — COMPREHENSIVE METABOLIC PANEL
ALBUMIN: 3.5 g/dL (ref 3.5–5.0)
ALK PHOS: 99 U/L (ref 38–126)
ALT: 21 U/L (ref 14–54)
ANION GAP: 10 (ref 5–15)
AST: 21 U/L (ref 15–41)
BUN: 13 mg/dL (ref 6–20)
CHLORIDE: 98 mmol/L — AB (ref 101–111)
CO2: 26 mmol/L (ref 22–32)
Calcium: 9.2 mg/dL (ref 8.9–10.3)
Creatinine, Ser: 0.97 mg/dL (ref 0.44–1.00)
GFR calc non Af Amer: >60 mL/min (ref >60)
GLUCOSE: 241 mg/dL — AB (ref 65–99)
Potassium: 3.4 mmol/L — ABNORMAL LOW (ref 3.5–5.1)
SODIUM: 134 mmol/L — AB (ref 135–145)
Total Bilirubin: 0.4 mg/dL (ref 0.3–1.2)
Total Protein: 6.7 g/dL (ref 6.5–8.1)

## 2016-08-21 MED ORDER — SODIUM CHLORIDE 0.9 % IV SOLN
600.0000 mg/m2 | Freq: Once | INTRAVENOUS | Status: AC
  Administered 2016-08-21: 1180 mg via INTRAVENOUS
  Filled 2016-08-21: qty 50

## 2016-08-21 MED ORDER — TEMAZEPAM 30 MG PO CAPS: 30.0000 mg | ORAL_CAPSULE | Freq: Every evening | ORAL | 0 refills | Status: DC | PRN

## 2016-08-21 MED ORDER — DOXORUBICIN HCL CHEMO IV INJECTION 2 MG/ML
60.0000 mg/m2 | Freq: Once | INTRAVENOUS | Status: AC
  Administered 2016-08-21: 118 mg via INTRAVENOUS
  Filled 2016-08-21: qty 59

## 2016-08-21 MED ORDER — POTASSIUM CHLORIDE 20 MEQ/15ML (10%) PO SOLN
40.0000 meq | Freq: Every day | ORAL | Status: AC
  Administered 2016-08-21: 40 meq via ORAL
  Filled 2016-08-21: qty 30

## 2016-08-21 MED ORDER — PALONOSETRON HCL INJECTION 0.25 MG/5ML
0.2500 mg | Freq: Once | INTRAVENOUS | Status: AC
  Administered 2016-08-21: 0.25 mg via INTRAVENOUS
  Filled 2016-08-21: qty 5

## 2016-08-21 MED ORDER — PEGFILGRASTIM 6 MG/0.6ML ~~LOC~~ PSKT
6.0000 mg | PREFILLED_SYRINGE | Freq: Once | SUBCUTANEOUS | Status: AC
  Administered 2016-08-21: 6 mg via SUBCUTANEOUS
  Filled 2016-08-21: qty 0.6

## 2016-08-21 MED ORDER — SODIUM CHLORIDE 0.9 % IV SOLN
Freq: Once | INTRAVENOUS | Status: AC
  Administered 2016-08-21: 11:00:00 via INTRAVENOUS
  Filled 2016-08-21: qty 5

## 2016-08-21 MED ORDER — SODIUM CHLORIDE 0.9% FLUSH
10.0000 mL | INTRAVENOUS | Status: DC | PRN
  Administered 2016-08-21: 10 mL
  Filled 2016-08-21: qty 10

## 2016-08-21 MED ORDER — SODIUM CHLORIDE 0.9 % IV SOLN
Freq: Once | INTRAVENOUS | Status: AC
  Administered 2016-08-21: 11:00:00 via INTRAVENOUS

## 2016-08-21 MED ORDER — BUTALBITAL-APAP-CAFFEINE 50-325-40 MG PO TABS
1.0000 | ORAL_TABLET | Freq: Once | ORAL | Status: AC
  Administered 2016-08-21: 1 via ORAL
  Filled 2016-08-21: qty 1

## 2016-08-21 MED ORDER — HEPARIN SOD (PORK) LOCK FLUSH 100 UNIT/ML IV SOLN
500.0000 [IU] | Freq: Once | INTRAVENOUS | Status: AC | PRN
  Administered 2016-08-21: 500 [IU]
  Filled 2016-08-21: qty 5

## 2016-08-21 NOTE — Progress Notes (Signed)
[name] tolerated chemo tx with Neulasta on-pro well without complaints or incident. Labs reviewed with Mike Craze NP prior to administering chemotherapy.Port checked for blood multiple times while infusing Adriamycin. VSS upon discharge. Neulasta on-pro applied to pt's left arm with green indicator light flashing. Pain medication given for headache today was effective. Pt discharged self ambulatory in satisfactory condition accompanied by her son

## 2016-08-21 NOTE — Progress Notes (Signed)
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  4.1 x 6 cm right axillar lymph node also identified.      07/01/2016 Pathology Results    Infiltrating ductal carcinoma ER+ 80%, PR-, HER 2-, Ki67 90%      07/01/2016 Initial Biopsy    Ultrasound guided R breast biopsy and R axillary LN biopsy both were clipped post biopsy      07/20/2016 Imaging    MUGA- Upper normal left ventricular ejection fraction of 75% with normal LV wall motion.      07/21/2016 Imaging    CT CAP- 2.9 cm right upper outer quadrant breast mass, consistent with known primary breast carcinoma.  Right axillary and subpectoral lymphadenopathy, consistent with metastatic disease.  Tiny sub-cm bilateral pulmonary nodules are indeterminate, but favor postinflammatory etiology over early metastases. Recommend continued attention on follow-up CT.  No evidence of metastatic disease within the abdomen or pelvis.      07/23/2016 Procedure    Port placed by Dr. Barry Dienes      07/24/2016 -  Neo-Adjuvant Chemotherapy    Adriamycin/Cytoxan (current therapy)       07/30/2016 Imaging    Bone scan: Negative for osseous disease .       INTERVAL HISTORY:  Ms. Forquer returns for follow-up and consideration for her next cycle of  chemotherapy.   She is due for cycle #3 Adriamycin/Cytoxan. She is worried about her Hgb A1c; her blood sugars have been fluctuating quite a bit since she began chemotherapy. Her fasting blood sugar was 201 this morning; yesterday she had a bad headache and checked her blood sugar and it was 69.  She remains on Januvia and Glyburide-Metformin oral diabetes medications.   She feels like the right breast mass and right axillary adenopathy has improved significantly since starting chemotherapy.   She has a headache today 7/10; she points to bilateral temporal areas and frontal pain.  Denies any visual/auditory sensitivity; denies N&V. Her headache started yesterday and remains today; did not resolve with Tylenol at home yesterday.   She isn't sleeping well; she has been taking 2 Tylenol PM tabs at bedtime, but wakes up in the middle of the night unable to go back to sleep.    She was given oral potassium supplementation with K-Dur tabs; she was unable to swallow these pills at home, so she stopped taking them.    Appetite and energy levels are good. She recent cut her hair in anticipation of continued alopecia.    REVIEW OF SYSTEMS:  Review of Systems  Constitutional: Negative for appetite change, chills, fatigue and fever.  HENT:  Negative.   Eyes: Negative.   Respiratory: Negative.  Negative for cough and shortness of breath.   Cardiovascular: Negative.  Negative for chest pain, leg swelling  and palpitations.  Gastrointestinal: Negative.  Negative for abdominal pain, blood in stool, constipation, diarrhea, nausea and vomiting.  Endocrine: Negative.   Genitourinary: Negative.  Negative for dysuria, hematuria and vaginal bleeding.   Musculoskeletal: Negative.   Skin: Negative.   Neurological: Negative.   Psychiatric/Behavioral: Positive for sleep disturbance.     PAST MEDICAL/SURGICAL HISTORY:  Past Medical History:  Diagnosis Date  . Arthritis   . Cancer ([facility])   . Cough    dry  .  Diabetes mellitus without complication ([facility])   . Hypertension   . Ruptured eardrum    left    Past Surgical History:  Procedure Laterality Date  . ABDOMINAL HYSTERECTOMY     with bilateral bso  . COLONOSCOPY    . PORTACATH PLACEMENT Left 07/23/2016   Procedure: INSERTION PORT-A-CATH;  Surgeon: Stark Klein, MD;  Location: [facility];  Service: General;  Laterality: Left;     SOCIAL HISTORY:  Social History   Social History  . Marital status: Single    Spouse name: N/A  . Number of children: N/A  . Years of education: N/A   Occupational History  . Not on file.   Social History Main Topics  . Smoking status: Former Smoker    Types: Cigarettes  . Smokeless tobacco: Never Used  . Alcohol use No  . Drug use: No  . Sexual activity: Not on file   Other Topics Concern  . Not on file   Social History Narrative  . No narrative on file    FAMILY HISTORY:  No family history on file.  CURRENT MEDICATIONS:  Outpatient Encounter Prescriptions as of 08/21/2016  Medication Sig Note  . amLODipine (NORVASC) 10 MG tablet Take 10 mg by mouth daily.   [name] aspirin EC 81 MG tablet Take 81 mg by mouth daily.   [name] atorvastatin (LIPITOR) 10 MG tablet Take 10 mg by mouth every evening.   . glyBURIDE-metformin (GLUCOVANCE) 5-500 MG tablet Take 1 tablet by mouth 2 (two) times daily.   . hydrochlorothiazide (HYDRODIURIL) 25 MG tablet Take 25 mg by mouth daily.   [name] ibuprofen (ADVIL,MOTRIN) 800 MG tablet Take 800 mg by mouth every 8 (eight) hours as needed.   . lidocaine-prilocaine (EMLA) cream Apply a quarter size amount to affected area 1 hour prior to coming to chemotherapy.   [name] lisinopril (PRINIVIL,ZESTRIL) 40 MG tablet Take 40 mg by mouth daily.   . ondansetron (ZOFRAN) 8 MG tablet Take 1 tablet (8 mg total) by mouth every 8 (eight) hours as needed for nausea or vomiting. 07/20/2016: Have not started med yet  . oxyCODONE (OXY IR/ROXICODONE) 5 MG immediate release tablet Take 1-2  tablets (5-10 mg total) by mouth every 6 (six) hours as needed for moderate pain, severe pain or breakthrough pain.   [name] prochlorperazine (COMPAZINE) 10 MG tablet Take 1 tablet (10 mg total) by mouth every 6 (six) hours as needed for nausea or vomiting. 07/20/2016: Have not started med yet   . sitaGLIPtin (JANUVIA) 100 MG tablet Take 100 mg by mouth daily.   . [DISCONTINUED] potassium chloride SA (K-DUR,KLOR-CON) 20 MEQ tablet Take 1 tablet (20 mEq total) by mouth daily.   . temazepam (RESTORIL) 30 MG capsule Take 1 capsule (30 mg total) by mouth at bedtime as needed for sleep.    No facility-administered encounter medications on file as of 08/21/2016.     ALLERGIES:  No Known Allergies   PHYSICAL EXAM:  ECOG Performance status: 1 - Symptomatic,  but independent.   Vitals:   08/21/16 0940  BP: 104/68  Pulse: (!) 109  Resp: 20  Temp: 98.4 F (36.9 C)   Filed Weights   08/21/16 0940  Weight: 182 lb 6.4 oz (82.7 kg)    Physical Exam  Constitutional: She is oriented to person, place, and time and well-developed, well-nourished, and in no distress.  HENT:  Head: Normocephalic.  Mouth/Throat: Oropharynx is clear and moist. No oropharyngeal exudate (tongue with areas of hyperpigmentation (as expected chemotherapy treatment changes) ).  Eyes: Conjunctivae are normal. Pupils are equal, round, and reactive to light. No scleral icterus.  Neck: Normal range of motion. Neck supple.  Cardiovascular: Normal rate, regular rhythm and normal heart sounds.   Pulmonary/Chest: Effort normal and breath sounds normal. No respiratory distress. She has no wheezes.    Abdominal: Soft. Bowel sounds are normal. There is no tenderness.  Musculoskeletal: Normal range of motion. She exhibits no edema.  Lymphadenopathy:    She has no cervical adenopathy.  Neurological: She is alert and oriented to person, place, and time. No cranial nerve deficit.  Skin: Skin is warm and dry. No rash noted.  Psychiatric:  Mood, memory, affect and judgment normal.     LABORATORY DATA:  I have reviewed the labs as listed.  CBC    Component Value Date/Time   WBC 11.1 (H) 08/21/2016 0917   RBC 3.87 08/21/2016 0917   HGB 11.5 (L) 08/21/2016 0917   HCT 32.4 (L) 08/21/2016 0917   PLT 131 (L) 08/21/2016 0917   MCV 83.7 08/21/2016 0917   MCH 29.7 08/21/2016 0917   MCHC 35.5 08/21/2016 0917   RDW 12.4 08/21/2016 0917   LYMPHSABS 1.1 08/21/2016 0917   MONOABS 1.1 (H) 08/21/2016 0917   EOSABS 0.0 08/21/2016 0917   BASOSABS 0.0 08/21/2016 0917   CMP Latest Ref Rng & Units 08/21/2016 08/07/2016 07/31/2016  Glucose 65 - 99 mg/dL 241(H) 167(H) 167(H)  BUN 6 - 20 mg/dL 13 15 17   Creatinine 0.44 - 1.00 mg/dL 0.97 0.88 0.97  Sodium 135 - 145 mmol/L 134(L) 138 135  Potassium 3.5 - 5.1 mmol/L 3.4(L) 3.5 3.4(L)  Chloride 101 - 111 mmol/L 98(L) 105 100(L)  CO2 22 - 32 mmol/L 26 24 26   Calcium 8.9 - 10.3 mg/dL 9.2 9.2 9.0  Total Protein 6.5 - 8.1 g/dL 6.7 6.8 7.0  Total Bilirubin 0.3 - 1.2 mg/dL 0.4 0.4 0.5  Alkaline Phos 38 - 126 U/L 99 105 94  AST 15 - 41 U/L 21 20 19   ALT 14 - 54 U/L 21 28 17     PENDING LABS:    DIAGNOSTIC IMAGING:  Mammogram: 07/02/16 [facility])    PATHOLOGY:  Right breast and axillary LN biopsy: 07/01/16      ASSESSMENT & PLAN:   Stage IIB (T2N1M0) invasive ductal carcinoma, ER+/PR-/HER2-: -Labs reviewed and are adequate for treatment today. Proceed with cycle #3 Adriamycin/Cytoxan. She will complete 4 cycles of AC, followed by 12 cycles of Taxol.   -Clinical breast exam: Right breast UOQ mass decreasing in size; axillary adenopathy appears to clinically be improving as well.  -Return to cancer center in 3 weeks for consideration for final cycle of AC.  We briefly discussed the plan for Taxol therapy and possible side effects; will need to review at upcoming visits.   Headache:  -Likely tension headache. Will give her 1 tab Fioricet today while in treatment area;  nursing may repeat x 1 tab in 1 hour if no relief.  Mild hypokalemia:  -Serum potassium 3.4 today. Will give 40 mEq oral potassium solution given her previous intolerance of K-Dur tabs.   Hyperglycemia:  -Likely secondary to steroids with chemotherapy. Encouraged patient to maintain follow-up with her endocrinologist/PCP for continued management.  If her blood sugars continue to increase, we may need to consider sliding scale insulin with meals while she is undergoing chemotherapy.  Will continue to monitor.   Insomnia:  -Tylenol PM ineffective. Discussed trying Melatonin OTC vs prescription Temazepam.  She "just wants whatever will help me sleep better at night."  -Paper prescription for Temazepam 30 mg QHSprn, #30, no refills given to patient today.      Dispo:  -Return to cancer center in 3 weeks for follow-up and consideration for cycle #4 Adriamycin/Cytoxan.    All questions were answered to patient's stated satisfaction. Encouraged patient to call with any new concerns or questions before her next visit to the cancer center and we can certain see her sooner, if needed.     Mike Craze, NP [place] [phone]

## 2016-08-21 NOTE — Patient Instructions (Signed)
[facility] Discharge Instructions for Patients Receiving Chemotherapy   Beginning January 23rd 2017 lab work for the Cancer Center will be done in the  Main lab at [facility] on 1st floor. If you have a lab appointment with the Cancer Center please come in thru the  Main Entrance and check in at the main information desk   Today you received the following chemotherapy agents Adriamycin and Cytoxan as well as Neulasta on-pro. Follow-up as scheduled. Call clinic for any questions or concerns  To help prevent nausea and vomiting after your treatment, we encourage you to take your nausea medication   If you develop nausea and vomiting, or diarrhea that is not controlled by your medication, call the clinic.  The clinic phone number is [phone]. Office hours are Monday-Friday 8:30am-5:00pm.  BELOW ARE SYMPTOMS THAT SHOULD BE REPORTED IMMEDIATELY:  *FEVER GREATER THAN 101.0 F  *CHILLS WITH OR WITHOUT FEVER  NAUSEA AND VOMITING THAT IS NOT CONTROLLED WITH YOUR NAUSEA MEDICATION  *UNUSUAL SHORTNESS OF BREATH  *UNUSUAL BRUISING OR BLEEDING  TENDERNESS IN MOUTH AND THROAT WITH OR WITHOUT PRESENCE OF ULCERS  *URINARY PROBLEMS  *BOWEL PROBLEMS  UNUSUAL RASH Items with * indicate a potential emergency and should be followed up as soon as possible. If you have an emergency after office hours please contact your primary care physician or go to the nearest emergency department.  Please call the clinic during office hours if you have any questions or concerns.   You may also contact the Patient Navigator at [phone] should you have any questions or need assistance in obtaining follow up care.      Resources For Cancer Patients and their Caregivers ? American Cancer Society: Can assist with transportation, wigs, general needs, runs Look Good Feel Better.        1-888-227-6333 ? Cancer Care: Provides financial assistance, online support groups,  medication/co-pay assistance.  1-800-813-HOPE (4673) ? [facility] Assists [place] cancer patients and their families through emotional , educational and financial support.  336-427-4357 ? [place] DSS Where to apply for food stamps, Medicaid and utility assistance. 336-342-1394 ? RCATS: Transportation to medical appointments. 336-347-2287 ? Social Security Administration: May apply for disability if have a Stage IV cancer. 336-342-7796 1-800-772-1213 ? [place] Aging, Disability and Transit Services: Assists with nutrition, care and transit needs. 336-349-2343         

## 2016-08-21 NOTE — Patient Instructions (Addendum)
[place] at [facility] Discharge Instructions  RECOMMENDATIONS MADE BY THE CONSULTANT AND ANY TEST RESULTS WILL BE SENT TO YOUR REFERRING PHYSICIAN.  Chemo in 3 weeks  We are giving you Restoril (temazepam) for sleep  Return to see Elzie Rings    Thank you for choosing [place] at [facility] to provide your oncology and hematology care.  To afford each patient quality time with our provider, please arrive at least 15 minutes before your scheduled appointment time.    If you have a lab appointment with the [facility] please come in thru the  Main Entrance and check in at the main information desk  You need to re-schedule your appointment should you arrive 10 or more minutes late.  We strive to give you quality time with our providers, and arriving late affects you and other patients whose appointments are after yours.  Also, if you no show three or more times for appointments you may be dismissed from the clinic at the providers discretion.     Again, thank you for choosing [facility].  Our hope is that these requests will decrease the amount of time that you wait before being seen by our physicians.       _____________________________________________________________  Should you have questions after your visit to [facility], please contact our office at [phone] between the hours of 8:30 a.m. and 4:30 p.m.  Voicemails left after 4:30 p.m. will not be returned until the following business day.  For prescription refill requests, have your pharmacy contact our office.       Resources For Cancer Patients and their Caregivers ? American Cancer Society: Can assist with transportation, wigs, general needs, runs Look Good Feel Better.        (628) 615-6828 ? Cancer Care: Provides financial assistance, online support groups, medication/co-pay assistance.  1-800-813-HOPE 270 177 2541) ? [facility] Assists [place] cancer patients and their families through emotional , educational and financial support.  251-548-5609 ? [place] DSS Where to apply for food stamps, Medicaid and utility assistance. 667-061-0006 ? RCATS: Transportation to medical appointments. 530-839-6678 ? Social Security Administration: May apply for disability if have a Stage IV cancer. (727) 575-9177 862-454-5988 ? [employer], Disability and Transit Services: Assists with nutrition, care and transit needs. [place] Support Programs: @10RELATIVEDAYS @ > Cancer Support Group  2nd Tuesday of the month 1pm-2pm, Journey Room  > Creative Journey  3rd Tuesday of the month 1130am-1pm, Journey Room  > Look Good Feel Better  1st Wednesday of the month 10am-12 noon, Journey Room (Call [place] to register [phone])

## 2016-08-27 ENCOUNTER — Other Ambulatory Visit (HOSPITAL_COMMUNITY): Payer: Self-pay | Admitting: Adult Health

## 2016-08-27 DIAGNOSIS — C50411 Malignant neoplasm of upper-outer quadrant of right female breast: Secondary | ICD-10-CM

## 2016-08-27 DIAGNOSIS — E876 Hypokalemia: Secondary | ICD-10-CM

## 2016-08-27 DIAGNOSIS — Z170 Estrogen receptor positive status [ER+]: Principal | ICD-10-CM

## 2016-09-04 ENCOUNTER — Encounter (HOSPITAL_COMMUNITY): Payer: No Typology Code available for payment source | Attending: Hematology & Oncology

## 2016-09-04 ENCOUNTER — Ambulatory Visit (HOSPITAL_COMMUNITY): Payer: PRIVATE HEALTH INSURANCE

## 2016-09-04 ENCOUNTER — Encounter (HOSPITAL_BASED_OUTPATIENT_CLINIC_OR_DEPARTMENT_OTHER): Payer: No Typology Code available for payment source | Admitting: Adult Health

## 2016-09-04 ENCOUNTER — Encounter (HOSPITAL_COMMUNITY): Payer: Self-pay | Admitting: Adult Health

## 2016-09-04 ENCOUNTER — Ambulatory Visit (HOSPITAL_COMMUNITY)
Admission: RE | Admit: 2016-09-04 | Discharge: 2016-09-04 | Disposition: A | Discharge status: Home / Self Care | Payer: No Typology Code available for payment source | Source: Ambulatory Visit | Attending: Adult Health | Admitting: Adult Health

## 2016-09-04 ENCOUNTER — Other Ambulatory Visit (HOSPITAL_COMMUNITY): Payer: Self-pay | Admitting: Oncology

## 2016-09-04 VITALS — BP 106/51 | HR 92 | Temp 98.5°F | Resp 16 | Wt 179.5 lb

## 2016-09-04 VITALS — BP 112/76 | HR 110 | Temp 98.8°F | Resp 16 | Wt 179.5 lb

## 2016-09-04 DIAGNOSIS — T85698A Other mechanical complication of other specified internal prosthetic devices, implants and grafts, initial encounter: Secondary | ICD-10-CM | POA: Insufficient documentation

## 2016-09-04 DIAGNOSIS — Z452 Encounter for adjustment and management of vascular access device: Secondary | ICD-10-CM

## 2016-09-04 DIAGNOSIS — Z170 Estrogen receptor positive status [ER+]: Secondary | ICD-10-CM

## 2016-09-04 DIAGNOSIS — E876 Hypokalemia: Secondary | ICD-10-CM

## 2016-09-04 DIAGNOSIS — C50411 Malignant neoplasm of upper-outer quadrant of right female breast: Secondary | ICD-10-CM | POA: Diagnosis not present

## 2016-09-04 DIAGNOSIS — F419 Anxiety disorder, unspecified: Secondary | ICD-10-CM

## 2016-09-04 DIAGNOSIS — Z5189 Encounter for other specified aftercare: Secondary | ICD-10-CM

## 2016-09-04 DIAGNOSIS — X58XXXA Exposure to other specified factors, initial encounter: Secondary | ICD-10-CM | POA: Diagnosis not present

## 2016-09-04 DIAGNOSIS — Z5111 Encounter for antineoplastic chemotherapy: Secondary | ICD-10-CM | POA: Diagnosis not present

## 2016-09-04 DIAGNOSIS — R519 Headache, unspecified: Secondary | ICD-10-CM

## 2016-09-04 DIAGNOSIS — R51 Headache: Secondary | ICD-10-CM | POA: Diagnosis not present

## 2016-09-04 DIAGNOSIS — Z95828 Presence of other vascular implants and grafts: Secondary | ICD-10-CM

## 2016-09-04 DIAGNOSIS — Z01818 Encounter for other preprocedural examination: Secondary | ICD-10-CM | POA: Insufficient documentation

## 2016-09-04 LAB — CBC WITH DIFFERENTIAL/PLATELET
BASOS ABS: 0 10*3/uL (ref 0.0–0.1)
BASOS PCT: 0 %
EOS PCT: 0 %
Eosinophils Absolute: 0 10*3/uL (ref 0.0–0.7)
HCT: 30.7 % — ABNORMAL LOW (ref 36.0–46.0)
Hemoglobin: 10.7 g/dL — ABNORMAL LOW (ref 12.0–15.0)
Lymphocytes Relative: 8 %
Lymphs Abs: 0.9 10*3/uL (ref 0.7–4.0)
MCH: 29.4 pg (ref 26.0–34.0)
MCHC: 34.9 g/dL (ref 30.0–36.0)
MCV: 84.3 fL (ref 78.0–100.0)
Monocytes Absolute: 0.9 10*3/uL (ref 0.1–1.0)
Monocytes Relative: 9 %
Neutro Abs: 9.1 10*3/uL — ABNORMAL HIGH (ref 1.7–7.7)
Neutrophils Relative %: 83 %
PLATELETS: 190 10*3/uL (ref 150–400)
RBC: 3.64 MIL/uL — ABNORMAL LOW (ref 3.87–5.11)
RDW: 13.3 % (ref 11.5–15.5)
WBC: 10.9 10*3/uL — ABNORMAL HIGH (ref 4.0–10.5)

## 2016-09-04 LAB — COMPREHENSIVE METABOLIC PANEL
ALBUMIN: 3.5 g/dL (ref 3.5–5.0)
ALT: 19 U/L (ref 14–54)
AST: 21 U/L (ref 15–41)
Alkaline Phosphatase: 103 U/L (ref 38–126)
Anion gap: 10 (ref 5–15)
BUN: 14 mg/dL (ref 6–20)
CHLORIDE: 101 mmol/L (ref 101–111)
CO2: 25 mmol/L (ref 22–32)
Calcium: 9.5 mg/dL (ref 8.9–10.3)
Creatinine, Ser: 0.97 mg/dL (ref 0.44–1.00)
GFR calc Af Amer: >60 mL/min (ref >60)
GFR calc non Af Amer: >60 mL/min (ref >60)
GLUCOSE: 215 mg/dL — AB (ref 65–99)
POTASSIUM: 3.2 mmol/L — AB (ref 3.5–5.1)
SODIUM: 136 mmol/L (ref 135–145)
Total Bilirubin: 0.4 mg/dL (ref 0.3–1.2)
Total Protein: 7.1 g/dL (ref 6.5–8.1)

## 2016-09-04 MED ORDER — STERILE WATER FOR INJECTION IJ SOLN
INTRAMUSCULAR | Status: AC
  Filled 2016-09-04: qty 10

## 2016-09-04 MED ORDER — DOXORUBICIN HCL CHEMO IV INJECTION 2 MG/ML
60.0000 mg/m2 | Freq: Once | INTRAVENOUS | Status: AC
  Administered 2016-09-04: 118 mg via INTRAVENOUS
  Filled 2016-09-04: qty 59

## 2016-09-04 MED ORDER — PEGFILGRASTIM 6 MG/0.6ML ~~LOC~~ PSKT
6.0000 mg | PREFILLED_SYRINGE | Freq: Once | SUBCUTANEOUS | Status: AC
  Administered 2016-09-04: 6 mg via SUBCUTANEOUS
  Filled 2016-09-04: qty 0.6

## 2016-09-04 MED ORDER — IOPAMIDOL (ISOVUE-370) INJECTION 76%
INTRAVENOUS | Status: AC
  Administered 2016-09-04: 6 mL
  Filled 2016-09-04: qty 50

## 2016-09-04 MED ORDER — HEPARIN SOD (PORK) LOCK FLUSH 100 UNIT/ML IV SOLN
INTRAVENOUS | Status: AC
  Filled 2016-09-04: qty 5

## 2016-09-04 MED ORDER — HEPARIN SOD (PORK) LOCK FLUSH 100 UNIT/ML IV SOLN
500.0000 [IU] | Freq: Once | INTRAVENOUS | Status: DC | PRN
  Filled 2016-09-04: qty 5

## 2016-09-04 MED ORDER — SODIUM CHLORIDE 0.9% FLUSH
INTRAVENOUS | Status: AC
  Filled 2016-09-04: qty 10

## 2016-09-04 MED ORDER — KLOR-CON M20 20 MEQ PO TBCR: 40.0000 meq | EXTENDED_RELEASE_TABLET | Freq: Every day | ORAL | 0 refills | Status: DC

## 2016-09-04 MED ORDER — ALTEPLASE 2 MG IJ SOLR
2.0000 mg | Freq: Once | INTRAMUSCULAR | Status: AC
  Administered 2016-09-04: 2 mg

## 2016-09-04 MED ORDER — ALTEPLASE 2 MG IJ SOLR
INTRAMUSCULAR | Status: AC
  Filled 2016-09-04: qty 2

## 2016-09-04 MED ORDER — POTASSIUM CHLORIDE CRYS ER 20 MEQ PO TBCR
40.0000 meq | EXTENDED_RELEASE_TABLET | Freq: Once | ORAL | Status: AC
  Administered 2016-09-04: 40 meq via ORAL
  Filled 2016-09-04: qty 2

## 2016-09-04 MED ORDER — ALPRAZOLAM 0.5 MG PO TABS: 0.5000 mg | ORAL_TABLET | Freq: Three times a day (TID) | ORAL | 0 refills | Status: DC | PRN

## 2016-09-04 MED ORDER — HEPARIN SOD (PORK) LOCK FLUSH 100 UNIT/ML IV SOLN
500.0000 [IU] | Freq: Once | INTRAVENOUS | Status: AC
Start: 2016-09-04 — End: 2016-09-04
  Administered 2016-09-04: 500 [IU] via INTRAVENOUS

## 2016-09-04 MED ORDER — SODIUM CHLORIDE 0.9 % IV SOLN
600.0000 mg/m2 | Freq: Once | INTRAVENOUS | Status: AC
  Administered 2016-09-04: 1180 mg via INTRAVENOUS
  Filled 2016-09-04: qty 50

## 2016-09-04 MED ORDER — BUTALBITAL-APAP-CAFFEINE 50-325-40 MG PO TABS
1.0000 | ORAL_TABLET | ORAL | Status: AC
  Administered 2016-09-04: 1 via ORAL
  Filled 2016-09-04: qty 1

## 2016-09-04 MED ORDER — SODIUM CHLORIDE 0.9 % IV SOLN
Freq: Once | INTRAVENOUS | Status: AC
  Administered 2016-09-04: 12:00:00 via INTRAVENOUS
  Filled 2016-09-04: qty 5

## 2016-09-04 MED ORDER — PALONOSETRON HCL INJECTION 0.25 MG/5ML
0.2500 mg | Freq: Once | INTRAVENOUS | Status: AC
  Administered 2016-09-04: 0.25 mg via INTRAVENOUS
  Filled 2016-09-04: qty 5

## 2016-09-04 MED ORDER — SODIUM CHLORIDE 0.9 % IV SOLN
Freq: Once | INTRAVENOUS | Status: AC
  Administered 2016-09-04: 12:00:00 via INTRAVENOUS

## 2016-09-04 MED ORDER — SODIUM CHLORIDE 0.9% FLUSH: 10.0000 mL | INTRAVENOUS | Status: DC | PRN

## 2016-09-04 NOTE — Progress Notes (Signed)
1010:  Alteplase 2 ml placed left chest portacath d/t no blood return upon accessing port.    1045:  Aspirated 1.5 ml Alteplase from port with no blood return.   1145:  Aspirated 1 ml Alteplase from port with no blood return.  Dr. Talbert Cage notified.  Okay to tx pt today; will send for dye study post chemo today.  Pt notified of plan and is agreeable.   Tolerated tx w/o adverse reaction.  Alert, in no distress.  VSS.  Discharged ambulatory.  Neulasta OnPro in place with green light blinking.  Instructed to stop by radiology for dye study before leaving.  Instructed to return to clinic if her needle was not deaccessed in radiology.

## 2016-09-04 NOTE — Patient Instructions (Addendum)
[place] at [facility] Discharge Instructions  RECOMMENDATIONS MADE BY THE CONSULTANT AND ANY TEST RESULTS WILL BE SENT TO YOUR REFERRING PHYSICIAN.  You were seen today by Mike Craze NP. Apply EMLA cream 2 hours before treatment. Take 1-2 tablets Xanax before your treatments. Take Prochlorperazine (compazine) as your 1st choice for nausea, it will not cause constipation. Then take the Ondansetron (zofran) if needed. You can use Miralax or Senokot as a stool softener if needed. Take the Temazepam (Restoril) 1 tablet at bedtime to help you sleep. Return in 2 weeks for treatment.    Thank you for choosing [place] at [facility] to provide your oncology and hematology care.  To afford each patient quality time with our provider, please arrive at least 15 minutes before your scheduled appointment time.    If you have a lab appointment with the [facility] please come in thru the  Main Entrance and check in at the main information desk  You need to re-schedule your appointment should you arrive 10 or more minutes late.  We strive to give you quality time with our providers, and arriving late affects you and other patients whose appointments are after yours.  Also, if you no show three or more times for appointments you may be dismissed from the clinic at the providers discretion.     Again, thank you for choosing [facility].  Our hope is that these requests will decrease the amount of time that you wait before being seen by our physicians.       _____________________________________________________________  Should you have questions after your visit to [facility], please contact our office at [phone] between the hours of 8:30 a.m. and 4:30 p.m.  Voicemails left after 4:30 p.m. will not be returned until the following business day.  For prescription refill requests, have your pharmacy contact our office.        Resources For Cancer Patients and their Caregivers ? American Cancer Society: Can assist with transportation, wigs, general needs, runs Look Good Feel Better.        469-597-9030 ? Cancer Care: Provides financial assistance, online support groups, medication/co-pay assistance.  1-800-813-HOPE 985-440-6556) ? [facility] Assists [place] cancer patients and their families through emotional , educational and financial support.  316-281-4010 ? [place] DSS Where to apply for food stamps, Medicaid and utility assistance. 775-686-8840 ? RCATS: Transportation to medical appointments. 920-592-6419 ? Social Security Administration: May apply for disability if have a Stage IV cancer. 3861566119 508-133-5298 ? [employer], Disability and Transit Services: Assists with nutrition, care and transit needs. [place] Support Programs: @10RELATIVEDAYS @ > Cancer Support Group  2nd Tuesday of the month 1pm-2pm, Journey Room  > Creative Journey  3rd Tuesday of the month 1130am-1pm, Journey Room  > Look Good Feel Better  1st Wednesday of the month 10am-12 noon, Journey Room (Call [place] to register [phone])

## 2016-09-04 NOTE — Progress Notes (Addendum)
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  4.1 x 6 cm right axillar lymph node also identified.      07/01/2016 Pathology Results    Infiltrating ductal carcinoma ER+ 80%, PR-, HER 2-, Ki67 90%      07/01/2016 Initial Biopsy    Ultrasound guided R breast biopsy and R axillary LN biopsy both were clipped post biopsy      07/20/2016 Imaging    MUGA- Upper normal left ventricular ejection fraction of 75% with normal LV wall motion.      07/21/2016 Imaging    CT CAP- 2.9 cm right upper outer quadrant breast mass, consistent with known primary breast carcinoma.  Right axillary and subpectoral lymphadenopathy, consistent with metastatic disease.  Tiny sub-cm bilateral pulmonary nodules are indeterminate, but favor postinflammatory etiology over early metastases. Recommend continued attention on follow-up CT.  No evidence of metastatic disease within the abdomen or pelvis.      07/23/2016 Procedure    Port placed by Dr. Barry Dienes      07/24/2016 -  Neo-Adjuvant Chemotherapy    Adriamycin/Cytoxan (current therapy)       07/30/2016 Imaging    Bone scan: Negative for osseous disease .       INTERVAL HISTORY:  [name] returns for follow-up and consideration for her next cycle of  chemotherapy.   She is due for cycle #4 Adriamycin/Cytoxan today.  She is seen today in an exam room; she is tearful. Nursing present after several attempts to access her port-a-cath.  She voices that accessing her port is "the worst part of all of this."  EMLA cream reportedly ineffective; she is applying cream about 1 hour before treatment.    Endorses intermittent nausea; she is not taking her PRN antiemetics because she is worried about constipation. Last BM was yesterday.   Reports 7/10 right temporal headache at present. She is having periodic right temporal headaches. She received Fioricet at her last treatment with headache, which was reportedly very effective. Denies any visual changes or her unilateral weakness. She does endorse that periodically her left hand is numb and "jerks." She feels that she is often dropping things. Denies neck pain or left shoulder/joint pain.  She is wondering if this could be secondary to her diabetes, and having to check her blood sugars often. Reports excellent range of motion to her left arm.  Her appetite and energy levels have remained good. She continues to not be sleeping well; states that she took the temazepam prescribed at her last visit one time and it was effective. Unable to tell me why she does not take the Restoril more often.  For fun, she enjoys playing different games on her tablet. She also enjoys spending time with her grandchild.   REVIEW OF  SYSTEMS:  Review of Systems  Constitutional: Negative for appetite change, chills, fatigue and fever.  HENT:  Negative.   Eyes: Negative.   Respiratory: Negative.  Negative for cough and shortness of breath.   Cardiovascular: Negative.  Negative for chest pain, leg swelling and palpitations.  Gastrointestinal: Negative.  Negative for abdominal pain, blood in stool, constipation, diarrhea, nausea and vomiting.  Endocrine: Negative.   Genitourinary: Negative.  Negative for dysuria, hematuria and  vaginal bleeding.   Musculoskeletal: Negative.   Skin: Negative.   Neurological: Positive for headaches and numbness (left hand).  Psychiatric/Behavioral: Positive for sleep disturbance.     PAST MEDICAL/SURGICAL HISTORY:  Past Medical History:  Diagnosis Date  . Arthritis   . Cancer ([place])   . Cough    dry  . Diabetes mellitus without complication ([place])   . Hypertension   . Ruptured eardrum    left    Past Surgical History:  Procedure Laterality Date  . ABDOMINAL HYSTERECTOMY     with bilateral bso  . COLONOSCOPY    . PORTACATH PLACEMENT Left 07/23/2016   Procedure: INSERTION PORT-A-CATH;  Surgeon: Stark Klein, MD;  Location: [facility];  Service: General;  Laterality: Left;     SOCIAL HISTORY:  Social History   Social History  . Marital status: Single    Spouse name: N/A  . Number of children: N/A  . Years of education: N/A   Occupational History  . Not on file.   Social History Main Topics  . Smoking status: Former Smoker    Types: Cigarettes  . Smokeless tobacco: Never Used  . Alcohol use No  . Drug use: No  . Sexual activity: Not on file   Other Topics Concern  . Not on file   Social History Narrative  . No narrative on file    FAMILY HISTORY:  History reviewed. No pertinent family history.  CURRENT MEDICATIONS:  Outpatient Encounter Prescriptions as of 09/04/2016  Medication Sig Note  . amLODipine (NORVASC) 10 MG tablet Take 10 mg by mouth daily.   [name] aspirin EC 81 MG tablet Take 81 mg by mouth daily.   [name] atorvastatin (LIPITOR) 10 MG tablet Take 10 mg by mouth every evening.   . glyBURIDE-metformin (GLUCOVANCE) 5-500 MG tablet Take 1 tablet by mouth 2 (two) times daily.   . hydrochlorothiazide (HYDRODIURIL) 25 MG tablet Take 25 mg by mouth daily.   [name] ibuprofen (ADVIL,MOTRIN) 800 MG tablet Take 800 mg by mouth every 8 (eight) hours as needed.   [name] KLOR-CON M20 20 MEQ tablet    . lidocaine-prilocaine (EMLA) cream Apply a quarter size  amount to affected area 1 hour prior to coming to chemotherapy.   [name] lisinopril (PRINIVIL,ZESTRIL) 40 MG tablet Take 40 mg by mouth daily.   . ondansetron (ZOFRAN) 8 MG tablet Take 1 tablet (8 mg total) by mouth every 8 (eight) hours as needed for nausea or vomiting. 07/20/2016: Have not started med yet  . oxyCODONE (OXY IR/ROXICODONE) 5 MG immediate release tablet Take 1-2 tablets (5-10 mg total) by mouth every 6 (six) hours as needed for moderate pain, severe pain or breakthrough pain.   [name] prochlorperazine (COMPAZINE) 10 MG tablet Take 1 tablet (10 mg total) by mouth every 6 (six) hours as needed for nausea or vomiting. 07/20/2016: Have not started med yet   . sitaGLIPtin (JANUVIA) 100 MG tablet Take 100 mg by mouth daily.   . temazepam (RESTORIL) 30 MG capsule Take 1 capsule (30 mg  total) by mouth at bedtime as needed for sleep.   [name] ALPRAZolam (XANAX) 0.5 MG tablet Take 1 tablet (0.5 mg total) by mouth 3 (three) times daily as needed for anxiety.    No facility-administered encounter medications on file as of 09/04/2016.     ALLERGIES:  No Known Allergies   PHYSICAL EXAM:  ECOG Performance status: 1 - Symptomatic, but independent.   Vitals:   09/04/16 1034  BP: 112/76  Pulse: (!) 110  Resp: 16  Temp: 98.8 F (37.1 C)   Filed Weights   09/04/16 1034  Weight: 179 lb 8 oz (81.4 kg)    Physical Exam  Constitutional: She is oriented to person, place, and time and well-developed, well-nourished, and in no distress.  HENT:  Head: Normocephalic.  Mouth/Throat: Oropharynx is clear and moist. No oropharyngeal exudate (tongue with areas of hyperpigmentation (as expected chemotherapy treatment changes) ).  Eyes: Conjunctivae are normal. No scleral icterus.  Neck: Normal range of motion. Neck supple.  Cardiovascular: Normal rate, regular rhythm and normal heart sounds.   Pulmonary/Chest: Effort normal and breath sounds normal. No respiratory distress. She has no wheezes.  Abdominal: Soft.  Bowel sounds are normal. There is no tenderness.  Musculoskeletal: Normal range of motion. She exhibits no edema.  Lymphadenopathy:    She has no cervical adenopathy.  Neurological: She is alert and oriented to person, place, and time. No cranial nerve deficit.  Skin: Skin is warm and dry. No rash noted.  Psychiatric: Mood, memory, affect and judgment normal.     LABORATORY DATA:  I have reviewed the labs as listed.  CBC    Component Value Date/Time   WBC 10.9 (H) 09/04/2016 1005   RBC 3.64 (L) 09/04/2016 1005   HGB 10.7 (L) 09/04/2016 1005   HCT 30.7 (L) 09/04/2016 1005   PLT 190 09/04/2016 1005   MCV 84.3 09/04/2016 1005   MCH 29.4 09/04/2016 1005   MCHC 34.9 09/04/2016 1005   RDW 13.3 09/04/2016 1005   LYMPHSABS 0.9 09/04/2016 1005   MONOABS 0.9 09/04/2016 1005   EOSABS 0.0 09/04/2016 1005   BASOSABS 0.0 09/04/2016 1005   CMP Latest Ref Rng & Units 09/04/2016 08/21/2016 08/07/2016  Glucose 65 - 99 mg/dL 215(H) 241(H) 167(H)  BUN 6 - 20 mg/dL _0 Creatinine 0.44 - 1.00 mg/dL 0.97 0.97 0.88  Sodium 135 - 145 mmol/L 136 134(L) 138  Potassium 3.5 - 5.1 mmol/L 3.2(L) 3.4(L) 3.5  Chloride 101 - 111 mmol/L 101 98(L) 105  CO2 22 - 32 mmol/L _1 Calcium 8.9 - 10.3 mg/dL 9.5 9.2 9.2  Total Protein 6.5 - 8.1 g/dL 7.1 6.7 6.8  Total Bilirubin 0.3 - 1.2 mg/dL 0.4 0.4 0.4  Alkaline Phos 38 - 126 U/L 103 99 105  AST 15 - 41 U/L _2 ALT 14 - 54 U/L _3 PENDING LABS:    DIAGNOSTIC IMAGING:  Mammogram: 07/02/16 [facility])    PATHOLOGY:  Right breast and axillary LN biopsy: 07/01/16        ASSESSMENT & PLAN:   Stage IIB (T2N1M0) invasive ductal carcinoma, ER+/PR-/HER2-: -Labs reviewed and are adequate for treatment today. Proceed with cycle #4 Adriamycin/Cytoxan, followed by 12 cycles of Taxol.   -Return to cancer center in 2 weeks for consideration for cycle #1 Taxol.  We briefly reviewed common side effects of Taxol; will continue to  reinforce at subsequent follow-up visits.   Nausea, no vomiting:  -  Reports nausea after treatment; she is not taking her antiemetics as directed for fear of constipation.   -Gave her written instructions and reinforced education of adequate nausea control to maintain adequate nutrition and hydration during chemotherapy.  -She could try Compazine first, then Zofran given her concerns about constipation. Recommended she try Miralax or Senokot for constipation prevention, if needed.   Headache:  -Persistent right temporal headache; I have low suspicion this is secondary to malignancy. However, I will order MRI brain for further evaluation.  More likely differential diagnosis is cluster vs tension headache.  -Will give her 1 tab Fioricet today while in treatment area; nursing may repeat x 1 tab in 1 hour if no relief.   Anxiety:  -She has quite a bit of anxiety when her port-a-cath is accessed; endorses pain. She is tearful for much of today's visit and is fearful of the pain associated with her port being accessed each time for therapy.  -EMLA cream reportedly ineffective; she is applying it about 1 hour before chemotherapy as previously directed.  Recommended she try placing the EMLA cream 1.5-2 hours before treatment; will also recommend using ice pack with each port access as well to minimize her discomfort.  -Anxiety is likely playing a large role in pain and associated fear.  I will prescribe her Xanax 0.5 mg for her to take before her treatments in anticipation of her treatments; this will be important going forward since she will begin weekly Taxol chemotherapy soon.   Insomnia:  -Reports that Temazepam prescription helped her sleep; she is not taking the medication consistently, but cannot tell me why she does not take it. I suspect she could be overwhelmed with all of the new medications prescribed during her treatment.  -Provided written instructions on when to take Temazepam to help with  sleep.   Port-a-cath:  -Absent blood return after tPA; dye study ordered to assess function of port-a-cath.  Addendum:     [facility] study dated 09/04/16 revealed: Subtotal occlusion of tip in catheter at [facility], with findings suggesting a small fibrin sheath. Cannot exclude an additional small thrombus plug at catheter tip. Access needle noted to be within reservoir.       Dispo:  -Return to cancer center in 2 weeks for follow-up and consideration for cycle #1 Taxol.     All questions were answered to patient's stated satisfaction. Encouraged patient to call with any new concerns or questions before her next visit to the cancer center and we can certain see her sooner, if needed.     Orders placed this encounter:  Orders Placed This Encounter  Procedures  . MR Brain W Wo Contrast  . DG CV Line Injection      Mike Craze, NP [place] [phone]

## 2016-09-04 NOTE — Addendum Note (Signed)
Addended by: Josephina Shih A on: 09/04/2016 03:03 PM   Modules accepted: Orders

## 2016-09-07 ENCOUNTER — Telehealth (HOSPITAL_COMMUNITY): Payer: Self-pay

## 2016-09-07 NOTE — Telephone Encounter (Signed)
Patient had called triage line last evening because her Neulasta On-Pro was still beeping and blinking red. Called patient this am and she states the blinking and beeping has stopped. Reviewed with RN. Instructed patient she could go ahead and take off the on-pro but to put it in a Ziploc bag and bring back to [place] at next visit. patient verbalized understanding.

## 2016-09-11 ENCOUNTER — Ambulatory Visit (HOSPITAL_COMMUNITY)
Admission: RE | Admit: 2016-09-11 | Discharge: 2016-09-11 | Disposition: A | Discharge status: Home / Self Care | Payer: No Typology Code available for payment source | Source: Ambulatory Visit | Attending: Adult Health | Admitting: Adult Health

## 2016-09-11 DIAGNOSIS — R519 Headache, unspecified: Secondary | ICD-10-CM

## 2016-09-11 DIAGNOSIS — R51 Headache: Secondary | ICD-10-CM

## 2016-09-11 DIAGNOSIS — C50411 Malignant neoplasm of upper-outer quadrant of right female breast: Secondary | ICD-10-CM

## 2016-09-11 DIAGNOSIS — Z170 Estrogen receptor positive status [ER+]: Principal | ICD-10-CM

## 2016-09-17 ENCOUNTER — Encounter (HOSPITAL_COMMUNITY): Payer: Self-pay | Admitting: Oncology

## 2016-09-17 NOTE — Progress Notes (Signed)
[facility], [place] [address]  Malignant neoplasm of upper-outer quadrant of right breast in female, estrogen receptor positive ([place])  Nonintractable headache, unspecified chronicity pattern, unspecified headache type - Plan: CT Head W Wo Contrast  Left-sided weakness - Plan: CT Head W Wo Contrast  CURRENT THERAPY: Neoadjuvant chemotherapy AC followed by T beginning on 07/24/2016.  INTERVAL HISTORY: [name] 61 y.o. 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    Breast cancer of upper-outer quadrant of right female breast ([place])   06/24/2016 Mammogram    Diagnostic bilateral mammogram: highly suspicious 3.5 cm mass in the UO R breast with marked enlarged R axillary LN      06/24/2016 Breast US    2.7 x 3.1 x 3.5 cm slightly irregular hypoechoic mass at the 10:00 position of right breast, 6 cm from the nipple.  4.1 x 6 cm right axillar lymph node also identified.      07/01/2016 Pathology Results    Infiltrating ductal carcinoma ER+ 80%, PR-, HER 2-, Ki67 90%      07/01/2016 Initial Biopsy    Ultrasound guided R breast biopsy and R axillary LN biopsy both were clipped post biopsy      07/20/2016 Imaging    MUGA- Upper normal left ventricular ejection fraction of 75% with normal LV wall motion.      07/21/2016 Imaging    CT CAP- 2.9 cm right upper outer quadrant breast mass, consistent with known primary breast carcinoma.  Right axillary and subpectoral lymphadenopathy, consistent with metastatic disease.  Tiny sub-cm bilateral pulmonary nodules are indeterminate, but favor postinflammatory etiology over early metastases. Recommend continued attention on follow-up CT.  No evidence of metastatic disease within the abdomen or pelvis.      07/23/2016 Procedure    Port placed by Dr. Barry Dienes      07/24/2016 -  Neo-Adjuvant Chemotherapy    Adriamycin/Cytoxan (current  therapy)       07/30/2016 Imaging    Bone scan: Negative for osseous disease .       She started paclitaxel today.  She will get this weekly 12.  I reviewed the risks, side effects, alternatives, and side effects of paclitaxel.  We discussed Taxol-specific side effects including myelosuppression with dose limiting neutropenia nadir at day 8-10 and recovery by day 15-21, hypersensitivity reaction occuring in 20-40% of patients, neurotoxicity mainly in the form of sensory neuropathy with numbness and paresthesias, transient asymptomatic bradycardia is the most commonly observed cardiotoxicity, alopecia occurs in nearly all patients with total body hair loss, mucositis and/or diarrhea in 30-40% of patients, transient elevations in serum transaminases, bilirubin, and alkaline phophatase, and onycholysis which is mainly observed in those receiving >6 courses on a weekly schedule and not typical in those receiving paclitaxel on an every 3 week schedule.  She reports intermittent left-sided weakness of arm/hand and leg.  She denies any falls but notes that she has been losing her balance due to this left-sided weakness.  She saw Mike Craze, NP, 2 weeks ago at which time the patient reported headaches.  He was set up for an MRI of the brain and this was scheduled for 09/11/2016.  It was canceled because the patient's inability to remain still within the machine.    She otherwise denies any oncology specific complaints.  Weight is stable.  Appetite is stable.  She denies any nausea or vomiting.  She denies  any chest pain or shortness of breath.  She denies any new pain.  She denies any headaches recently.  Review of Systems  Constitutional: Negative.  Negative for chills, fever and weight loss.  HENT: Negative.   Eyes: Negative.   Respiratory: Negative.  Negative for cough.   Cardiovascular: Negative.  Negative for chest pain.  Gastrointestinal: Negative.  Negative for blood in stool, constipation,  diarrhea, melena, nausea and vomiting.  Genitourinary: Negative.   Musculoskeletal: Negative.   Skin: Negative.   Neurological: Positive for focal weakness (left sided, intermittent, not present today). Negative for weakness.  Endo/Heme/Allergies: Negative.   Psychiatric/Behavioral: Negative.     Past Medical History:  Diagnosis Date  . Arthritis   . Breast cancer of upper-outer quadrant of right female breast ([place]) 07/14/2016  . Cancer ([place])   . Cough    dry  . Diabetes mellitus without complication ([place])   . Hypertension   . Ruptured eardrum    left     Past Surgical History:  Procedure Laterality Date  . ABDOMINAL HYSTERECTOMY     with bilateral bso  . COLONOSCOPY    . PORTACATH PLACEMENT Left 07/23/2016   Procedure: INSERTION PORT-A-CATH;  Surgeon: Stark Klein, MD;  Location: [facility];  Service: General;  Laterality: Left;    History reviewed. No pertinent family history.  Social History   Social History  . Marital status: Single    Spouse name: N/A  . Number of children: N/A  . Years of education: N/A   Social History Main Topics  . Smoking status: Former Smoker    Types: Cigarettes  . Smokeless tobacco: Never Used  . Alcohol use No  . Drug use: No  . Sexual activity: Not Asked   Other Topics Concern  . None   Social History Narrative  . None     PHYSICAL EXAMINATION  ECOG PERFORMANCE STATUS: 1 - Symptomatic but completely ambulatory  Vitals:   09/18/16 1040  BP: 116/75  Pulse: (!) 114  Resp: 20  Temp: 98.8 F (37.1 C)     GENERAL:alert, no distress, well nourished, well developed, comfortable, cooperative, obese, smiling and accompanied by her son. SKIN: skin color, texture, turgor are normal, no rashes or significant lesions HEAD: Normocephalic, No masses, lesions, tenderness or abnormalities, hair loss EYES: normal, EOMI, Conjunctiva are pink and non-injected EARS: External ears normal OROPHARYNX:lips, buccal mucosa, and  tongue normal and mucous membranes are moist  NECK: supple, trachea midline LYMPH:  no palpable lymphadenopathy BREAST:not examined LUNGS: clear to auscultation and percussion HEART: regular rate & rhythm, no murmurs, no gallops, S1 normal and S2 normal ABDOMEN:abdomen soft and normal bowel sounds BACK: Back symmetric, no curvature. EXTREMITIES:less then 2 second capillary refill, no joint deformities, effusion, or inflammation, no skin discoloration, no cyanosis.  5/5 strength bilaterally in grip strength, arm strength, lying strength, and shoulder shrug. NEURO: alert & oriented x 3 with fluent speech, no focal motor/sensory deficits   LABORATORY DATA: CBC    Component Value Date/Time   WBC 2.0 (L) 09/18/2016 1101   RBC 3.46 (L) 09/18/2016 1101   HGB 10.3 (L) 09/18/2016 1101   HCT 29.9 (L) 09/18/2016 1101   PLT 233 09/18/2016 1101   MCV 86.4 09/18/2016 1101   MCH 29.8 09/18/2016 1101   MCHC 34.4 09/18/2016 1101   RDW 15.6 (H) 09/18/2016 1101   LYMPHSABS 0.4 (L) 09/18/2016 1101   MONOABS 0.5 09/18/2016 1101   EOSABS 0.0 09/18/2016 1101   BASOSABS 0.0  09/18/2016 1101      Chemistry      Component Value Date/Time   NA 134 (L) 09/18/2016 1101   K 3.8 09/18/2016 1101   CL 99 (L) 09/18/2016 1101   CO2 24 09/18/2016 1101   BUN 11 09/18/2016 1101   CREATININE 0.92 09/18/2016 1101      Component Value Date/Time   CALCIUM 9.5 09/18/2016 1101   ALKPHOS 87 09/18/2016 1101   AST 19 09/18/2016 1101   ALT 17 09/18/2016 1101   BILITOT 0.4 09/18/2016 1101        PENDING LABS:   RADIOGRAPHIC STUDIES:  Dg Cv Line Injection  Result Date: 09/04/2016 INDICATION: Breast cancer, nonfunctional Port-A-Cath EXAM: CONTRAST INJECTION OF PORT A CATH UNDER FLUOROSCOPY CONTRAST:  6 cc of Omnipaque 350 IV TECHNIQUE: Contrast was administered via the indwelling port after it was accessed. Fluoroscopic spot images were obtained of the catheter during injection ANESTHESIA/SEDATION: None  FLUOROSCOPY TIME:  Radiation Exposure Index (as provided by the fluoroscopic device): 16.0 mGy If the device does not provide the exposure index: Fluoroscopy Time:  0 minutes and 6 seconds Number of Acquired Images: Single injection series with 17 images, single screen capture during fluoroscopy COMPLICATIONS: None immediate. TECHNIQUE: Contrast was injected into the access tubing of the patient's LEFT subclavian Port-A-Cath. FINDINGS: Port-A-Cath reservoir and tubing appear intact. Access needle is within the reservoir. Normal opacification of the reservoir and tubing. High resistance to contrast injection. Significantly im outflow of contrast from the catheter tip with contrast passing back along the distal catheter suggesting a fibrin sheath. A small thrombus plug at the tip of the catheter could also be present. Contrast does opacify the SVC but this is from minimal contrast outflow from the catheter tip. Unable to obtain blood return from the catheter. Catheter was flushed with normal saline at the conclusion of the procedure. IMPRESSION: Subtotal occlusion of the tip of the LEFT subclavian Port-A-Cath catheter at the SVC, with findings suggesting a small fibrin sheath, cannot exclude an additional small thrombus plug at the catheter tip. Findings called to Robynn Pane PA on 09/04/2016 at 1525 hours. Electronically Signed   By: Lavonia Dana M.D.   On: 09/04/2016 15:29     PATHOLOGY:    ASSESSMENT AND PLAN:  Breast cancer of upper-outer quadrant of right female breast (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  On Neoadjuvant chemotherapy consisting of AC followed by T beginning on 07/24/2016.  Oncology history is updated.  Pre-treatment labs today: CBC diff, CMET.  I personally reviewed and went over laboratory results with the patient.  The results are noted within this dictation. Labs satisfy treatment parameters.  ANC is 1.1.  I suspect she may need a  delay in treatment in the near future as her [place] drops secondary to anti-neoplastic chemotherapy.  Given her HIGH Ki-67, we need to treat aggressively.  We discussed Taxol-specific side effects including myelosuppression with dose limiting neutropenia nadir at day 8-10 and recovery by day 15-21, hypersensitivity reaction occuring in 20-40% of patients, neurotoxicity mainly in the form of sensory neuropathy with numbness and paresthesias, transient asymptomatic bradycardia is the most commonly observed cardiotoxicity, alopecia occurs in nearly all patients with total body hair loss, mucositis and/or diarrhea in 30-40% of patients, transient elevations in serum transaminases, bilirubin, and alkaline phophatase, and onycholysis which is mainly observed in those receiving >6 courses on a weekly schedule and not typical in those receiving paclitaxel on an every 3 week schedule.  For her intermittent left-sided weakness, I have ordered a CT of the head with and without contrast.  If abnormal, then we will pursue an MRI of the brain.  She provided education on the proper way to use EMLA cream.  Return in 1 week for follow-up with treatment.   ORDERS PLACED FOR THIS ENCOUNTER: Orders Placed This Encounter  Procedures  . CT Head W Wo Contrast    MEDICATIONS PRESCRIBED THIS ENCOUNTER: No orders of the defined types were placed in this encounter.   THERAPY PLAN:  Continue with neoadjuvant chemotherapy.  All questions were answered. The patient knows to call the clinic with any problems, questions or concerns. We can certainly see the patient much sooner if necessary.  Patient and plan discussed with Dr. Twana First and she is in agreement with the aforementioned.   This note is electronically signed by: Doy Mince 09/18/2016 11:40 PM

## 2016-09-17 NOTE — Assessment & Plan Note (Addendum)
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  On Neoadjuvant chemotherapy consisting of AC followed by T beginning on 07/24/2016.  Oncology history is updated.  Pre-treatment labs today: CBC diff, CMET.  I personally reviewed and went over laboratory results with the patient.  The results are noted within this dictation. Labs satisfy treatment parameters.  ANC is 1.1.  I suspect she may need a delay in treatment in the near future as her [place] drops secondary to anti-neoplastic chemotherapy.  Given her HIGH Ki-67, we need to treat aggressively.  We discussed Taxol-specific side effects including myelosuppression with dose limiting neutropenia nadir at day 8-10 and recovery by day 15-21, hypersensitivity reaction occuring in 20-40% of patients, neurotoxicity mainly in the form of sensory neuropathy with numbness and paresthesias, transient asymptomatic bradycardia is the most commonly observed cardiotoxicity, alopecia occurs in nearly all patients with total body hair loss, mucositis and/or diarrhea in 30-40% of patients, transient elevations in serum transaminases, bilirubin, and alkaline phophatase, and onycholysis which is mainly observed in those receiving >6 courses on a weekly schedule and not typical in those receiving paclitaxel on an every 3 week schedule.  For her intermittent left-sided weakness, I have ordered a CT of the head with and without contrast.  If abnormal, then we will pursue an MRI of the brain.  She provided education on the proper way to use EMLA cream.  Return in 1 week for follow-up with treatment.

## 2016-09-18 ENCOUNTER — Other Ambulatory Visit (HOSPITAL_COMMUNITY): Payer: Self-pay | Admitting: Oncology

## 2016-09-18 ENCOUNTER — Encounter (HOSPITAL_BASED_OUTPATIENT_CLINIC_OR_DEPARTMENT_OTHER): Payer: No Typology Code available for payment source

## 2016-09-18 ENCOUNTER — Encounter (HOSPITAL_BASED_OUTPATIENT_CLINIC_OR_DEPARTMENT_OTHER): Payer: No Typology Code available for payment source | Admitting: Oncology

## 2016-09-18 ENCOUNTER — Encounter (HOSPITAL_COMMUNITY): Payer: Self-pay | Admitting: Oncology

## 2016-09-18 VITALS — BP 116/75 | HR 114 | Temp 98.8°F | Resp 20 | Wt 178.7 lb

## 2016-09-18 VITALS — BP 128/77 | HR 106 | Temp 98.4°F | Resp 18

## 2016-09-18 DIAGNOSIS — Z01818 Encounter for other preprocedural examination: Secondary | ICD-10-CM | POA: Diagnosis not present

## 2016-09-18 DIAGNOSIS — C50411 Malignant neoplasm of upper-outer quadrant of right female breast: Secondary | ICD-10-CM

## 2016-09-18 DIAGNOSIS — R51 Headache: Secondary | ICD-10-CM | POA: Diagnosis not present

## 2016-09-18 DIAGNOSIS — R519 Headache, unspecified: Secondary | ICD-10-CM

## 2016-09-18 DIAGNOSIS — R531 Weakness: Secondary | ICD-10-CM

## 2016-09-18 DIAGNOSIS — Z5111 Encounter for antineoplastic chemotherapy: Secondary | ICD-10-CM

## 2016-09-18 DIAGNOSIS — Z170 Estrogen receptor positive status [ER+]: Secondary | ICD-10-CM

## 2016-09-18 DIAGNOSIS — E876 Hypokalemia: Secondary | ICD-10-CM

## 2016-09-18 LAB — COMPREHENSIVE METABOLIC PANEL
ALK PHOS: 87 U/L (ref 38–126)
ALT: 17 U/L (ref 14–54)
ANION GAP: 11 (ref 5–15)
AST: 19 U/L (ref 15–41)
Albumin: 3.5 g/dL (ref 3.5–5.0)
BUN: 11 mg/dL (ref 6–20)
CALCIUM: 9.5 mg/dL (ref 8.9–10.3)
CO2: 24 mmol/L (ref 22–32)
Chloride: 99 mmol/L — ABNORMAL LOW (ref 101–111)
Creatinine, Ser: 0.92 mg/dL (ref 0.44–1.00)
Glucose, Bld: 247 mg/dL — ABNORMAL HIGH (ref 65–99)
Potassium: 3.8 mmol/L (ref 3.5–5.1)
SODIUM: 134 mmol/L — AB (ref 135–145)
Total Bilirubin: 0.4 mg/dL (ref 0.3–1.2)
Total Protein: 7.1 g/dL (ref 6.5–8.1)

## 2016-09-18 LAB — CBC WITH DIFFERENTIAL/PLATELET
Basophils Absolute: 0 10*3/uL (ref 0.0–0.1)
Basophils Relative: 1 %
EOS PCT: 1 %
Eosinophils Absolute: 0 10*3/uL (ref 0.0–0.7)
HCT: 29.9 % — ABNORMAL LOW (ref 36.0–46.0)
HEMOGLOBIN: 10.3 g/dL — AB (ref 12.0–15.0)
LYMPHS ABS: 0.4 10*3/uL — AB (ref 0.7–4.0)
LYMPHS PCT: 19 %
MCH: 29.8 pg (ref 26.0–34.0)
MCHC: 34.4 g/dL (ref 30.0–36.0)
MCV: 86.4 fL (ref 78.0–100.0)
MONOS PCT: 25 %
Monocytes Absolute: 0.5 10*3/uL (ref 0.1–1.0)
NEUTROS PCT: 54 %
Neutro Abs: 1.1 10*3/uL — ABNORMAL LOW (ref 1.7–7.7)
Platelets: 233 10*3/uL (ref 150–400)
RBC: 3.46 MIL/uL — ABNORMAL LOW (ref 3.87–5.11)
RDW: 15.6 % — ABNORMAL HIGH (ref 11.5–15.5)
WBC: 2 10*3/uL — AB (ref 4.0–10.5)

## 2016-09-18 MED ORDER — FAMOTIDINE IN NACL 20-0.9 MG/50ML-% IV SOLN
20.0000 mg | Freq: Once | INTRAVENOUS | Status: AC
  Administered 2016-09-18: 20 mg via INTRAVENOUS

## 2016-09-18 MED ORDER — SODIUM CHLORIDE 0.9 % IV SOLN
Freq: Once | INTRAVENOUS | Status: AC
  Administered 2016-09-18: 13:00:00 via INTRAVENOUS

## 2016-09-18 MED ORDER — DIPHENHYDRAMINE HCL 50 MG/ML IJ SOLN
INTRAMUSCULAR | Status: AC
  Filled 2016-09-18: qty 1

## 2016-09-18 MED ORDER — SODIUM CHLORIDE 0.9% FLUSH
10.0000 mL | INTRAVENOUS | Status: DC | PRN
  Administered 2016-09-18: 10 mL
  Filled 2016-09-18: qty 10

## 2016-09-18 MED ORDER — SODIUM CHLORIDE 0.9 % IV SOLN
20.0000 mg | Freq: Once | INTRAVENOUS | Status: AC
  Administered 2016-09-18: 20 mg via INTRAVENOUS
  Filled 2016-09-18: qty 2

## 2016-09-18 MED ORDER — DIPHENHYDRAMINE HCL 50 MG/ML IJ SOLN
50.0000 mg | Freq: Once | INTRAMUSCULAR | Status: AC
  Administered 2016-09-18: 50 mg via INTRAVENOUS

## 2016-09-18 MED ORDER — HEPARIN SOD (PORK) LOCK FLUSH 100 UNIT/ML IV SOLN
INTRAVENOUS | Status: AC
  Filled 2016-09-18: qty 5

## 2016-09-18 MED ORDER — FAMOTIDINE IN NACL 20-0.9 MG/50ML-% IV SOLN
INTRAVENOUS | Status: AC
  Filled 2016-09-18: qty 50

## 2016-09-18 MED ORDER — HEPARIN SOD (PORK) LOCK FLUSH 100 UNIT/ML IV SOLN
500.0000 [IU] | Freq: Once | INTRAVENOUS | Status: AC | PRN
  Administered 2016-09-18: 500 [IU]

## 2016-09-18 MED ORDER — PACLITAXEL CHEMO INJECTION 300 MG/50ML
80.0000 mg/m2 | Freq: Once | INTRAVENOUS | Status: AC
  Administered 2016-09-18: 156 mg via INTRAVENOUS
  Filled 2016-09-18: qty 26

## 2016-09-18 NOTE — Patient Instructions (Addendum)
[place] at [facility] Discharge Instructions  RECOMMENDATIONS MADE BY THE CONSULTANT AND ANY TEST RESULTS WILL BE SENT TO YOUR REFERRING PHYSICIAN.  You were seen today by Manning [facility] Lab work and treatment today CT scan of head in 1 week Follow up in 1 week after scan with treatment See Amy up front for appointments   Thank you for choosing [place] at [facility] to provide your oncology and hematology care.  To afford each patient quality time with our provider, please arrive at least 15 minutes before your scheduled appointment time.    If you have a lab appointment with the [facility] please come in thru the  Main Entrance and check in at the main information desk  You need to re-schedule your appointment should you arrive 10 or more minutes late.  We strive to give you quality time with our providers, and arriving late affects you and other patients whose appointments are after yours.  Also, if you no show three or more times for appointments you may be dismissed from the clinic at the providers discretion.     Again, thank you for choosing [facility].  Our hope is that these requests will decrease the amount of time that you wait before being seen by our physicians.       _____________________________________________________________  Should you have questions after your visit to [facility], please contact our office at [phone] between the hours of 8:30 a.m. and 4:30 p.m.  Voicemails left after 4:30 p.m. will not be returned until the following business day.  For prescription refill requests, have your pharmacy contact our office.       Resources For Cancer Patients and their Caregivers ? American Cancer Society: Can assist with transportation, wigs, general needs, runs Look Good Feel Better.        (214)395-3213 ? Cancer Care: Provides financial assistance, online support groups,  medication/co-pay assistance.  1-800-813-HOPE 610-096-9130) ? [place] Assists [place] cancer patients and their families through emotional , educational and financial support.  267-390-7384 ? [place] DSS Where to apply for food stamps, Medicaid and utility assistance. 8024003092 ? [employer]: Transportation to medical appointments. (727) 054-7547 ? Social Security Administration: May apply for disability if have a Stage IV cancer. 307-435-5096 306-273-2821 ? [employer], Disability and Transit Services: Assists with nutrition, care and transit needs. [place] Support Programs: @10RELATIVEDAYS @ > Cancer Support Group  2nd Tuesday of the month 1pm-2pm, Journey Room  > Creative Journey  3rd Tuesday of the month 1130am-1pm, Journey Room  > Look Good Feel Better  1st Wednesday of the month 10am-12 noon, Journey Room (Call [facility] to register [phone])

## 2016-09-18 NOTE — Patient Instructions (Signed)
[facility] Discharge Instructions for Patients Receiving Chemotherapy   Beginning January 23rd 2017 lab work for the Cancer Center will be done in the  Main lab at [facility] on 1st floor. If you have a lab appointment with the Cancer Center please come in thru the  Main Entrance and check in at the main information desk   Today you received the following chemotherapy agents Taxol. Follow-up as scheduled. Call clinic for any questions or concerns  To help prevent nausea and vomiting after your treatment, we encourage you to take your nausea medication   If you develop nausea and vomiting, or diarrhea that is not controlled by your medication, call the clinic.  The clinic phone number is [phone]. Office hours are Monday-Friday 8:30am-5:00pm.  BELOW ARE SYMPTOMS THAT SHOULD BE REPORTED IMMEDIATELY:  *FEVER GREATER THAN 101.0 F  *CHILLS WITH OR WITHOUT FEVER  NAUSEA AND VOMITING THAT IS NOT CONTROLLED WITH YOUR NAUSEA MEDICATION  *UNUSUAL SHORTNESS OF BREATH  *UNUSUAL BRUISING OR BLEEDING  TENDERNESS IN MOUTH AND THROAT WITH OR WITHOUT PRESENCE OF ULCERS  *URINARY PROBLEMS  *BOWEL PROBLEMS  UNUSUAL RASH Items with * indicate a potential emergency and should be followed up as soon as possible. If you have an emergency after office hours please contact your primary care physician or go to the nearest emergency department.  Please call the clinic during office hours if you have any questions or concerns.   You may also contact the Patient Navigator at [phone] should you have any questions or need assistance in obtaining follow up care.      Resources For Cancer Patients and their Caregivers ? American Cancer Society: Can assist with transportation, wigs, general needs, runs Look Good Feel Better.        1-888-227-6333 ? Cancer Care: Provides financial assistance, online support groups, medication/co-pay assistance.  1-800-813-HOPE  (4673) ? [facility] Assists [place] cancer patients and their families through emotional , educational and financial support.  336-427-4357 ? [place] DSS Where to apply for food stamps, Medicaid and utility assistance. 336-342-1394 ? [employer]: Transportation to medical appointments. 336-347-2287 ? Social Security Administration: May apply for disability if have a Stage IV cancer. 336-342-7796 1-800-772-1213 ? [place] Aging, Disability and Transit Services: Assists with nutrition, care and transit needs. 336-349-2343         

## 2016-09-18 NOTE — Progress Notes (Signed)
Labs including ANC 1.1 reviewed with Kirby Crigler PA and pt approved for Taxol today.                                         [name] tolerated chemo tx well without complaints or incident. VSS upon discharge. Pt discharged self ambulatory in satisfactory condition accompanied by her son

## 2016-09-21 ENCOUNTER — Ambulatory Visit (HOSPITAL_COMMUNITY)
Admission: RE | Admit: 2016-09-21 | Discharge: 2016-09-21 | Disposition: A | Discharge status: Home / Self Care | Payer: No Typology Code available for payment source | Source: Ambulatory Visit | Attending: Oncology | Admitting: Oncology

## 2016-09-21 ENCOUNTER — Telehealth (HOSPITAL_COMMUNITY): Payer: Self-pay

## 2016-09-21 DIAGNOSIS — R531 Weakness: Secondary | ICD-10-CM | POA: Insufficient documentation

## 2016-09-21 DIAGNOSIS — R519 Headache, unspecified: Secondary | ICD-10-CM

## 2016-09-21 DIAGNOSIS — R51 Headache: Secondary | ICD-10-CM | POA: Diagnosis not present

## 2016-09-21 DIAGNOSIS — R938 Abnormal findings on diagnostic imaging of other specified body structures: Secondary | ICD-10-CM | POA: Diagnosis not present

## 2016-09-21 MED ORDER — IOPAMIDOL (ISOVUE-300) INJECTION 61%
75.0000 mL | Freq: Once | INTRAVENOUS | Status: AC | PRN
  Administered 2016-09-21: 75 mL via INTRAVENOUS

## 2016-09-21 NOTE — Telephone Encounter (Signed)
See telephone encounter note.

## 2016-09-22 ENCOUNTER — Other Ambulatory Visit (HOSPITAL_COMMUNITY): Payer: Self-pay | Admitting: Oncology

## 2016-09-22 DIAGNOSIS — J019 Acute sinusitis, unspecified: Secondary | ICD-10-CM

## 2016-09-22 MED ORDER — AMOXICILLIN-POT CLAVULANATE 875-125 MG PO TABS: 1.0000 | ORAL_TABLET | Freq: Two times a day (BID) | ORAL | 0 refills | Status: DC

## 2016-09-25 ENCOUNTER — Encounter (HOSPITAL_BASED_OUTPATIENT_CLINIC_OR_DEPARTMENT_OTHER): Payer: No Typology Code available for payment source | Admitting: Adult Health

## 2016-09-25 ENCOUNTER — Encounter (HOSPITAL_BASED_OUTPATIENT_CLINIC_OR_DEPARTMENT_OTHER): Payer: No Typology Code available for payment source

## 2016-09-25 ENCOUNTER — Encounter (HOSPITAL_COMMUNITY): Payer: Self-pay | Admitting: Adult Health

## 2016-09-25 VITALS — BP 134/77 | HR 107 | Temp 97.9°F | Resp 18 | Wt 177.1 lb

## 2016-09-25 VITALS — BP 120/69 | HR 98 | Temp 98.2°F | Resp 16

## 2016-09-25 DIAGNOSIS — Z170 Estrogen receptor positive status [ER+]: Secondary | ICD-10-CM

## 2016-09-25 DIAGNOSIS — Z5111 Encounter for antineoplastic chemotherapy: Secondary | ICD-10-CM

## 2016-09-25 DIAGNOSIS — E876 Hypokalemia: Secondary | ICD-10-CM

## 2016-09-25 DIAGNOSIS — Z01818 Encounter for other preprocedural examination: Secondary | ICD-10-CM | POA: Diagnosis not present

## 2016-09-25 DIAGNOSIS — R739 Hyperglycemia, unspecified: Secondary | ICD-10-CM

## 2016-09-25 DIAGNOSIS — F411 Generalized anxiety disorder: Secondary | ICD-10-CM

## 2016-09-25 DIAGNOSIS — E119 Type 2 diabetes mellitus without complications: Secondary | ICD-10-CM

## 2016-09-25 DIAGNOSIS — C50411 Malignant neoplasm of upper-outer quadrant of right female breast: Secondary | ICD-10-CM | POA: Diagnosis not present

## 2016-09-25 LAB — CBC WITH DIFFERENTIAL/PLATELET
BASOS PCT: 0 %
Basophils Absolute: 0 10*3/uL (ref 0.0–0.1)
EOS ABS: 0 10*3/uL (ref 0.0–0.7)
EOS PCT: 1 %
HCT: 29.5 % — ABNORMAL LOW (ref 36.0–46.0)
HEMOGLOBIN: 10.2 g/dL — AB (ref 12.0–15.0)
Lymphocytes Relative: 19 %
Lymphs Abs: 0.7 10*3/uL (ref 0.7–4.0)
MCH: 30.2 pg (ref 26.0–34.0)
MCHC: 34.6 g/dL (ref 30.0–36.0)
MCV: 87.3 fL (ref 78.0–100.0)
Monocytes Absolute: 0.4 10*3/uL (ref 0.1–1.0)
Monocytes Relative: 10 %
NEUTROS PCT: 70 %
Neutro Abs: 2.5 10*3/uL (ref 1.7–7.7)
PLATELETS: 348 10*3/uL (ref 150–400)
RBC: 3.38 MIL/uL — AB (ref 3.87–5.11)
RDW: 16.6 % — ABNORMAL HIGH (ref 11.5–15.5)
WBC: 3.6 10*3/uL — AB (ref 4.0–10.5)

## 2016-09-25 LAB — COMPREHENSIVE METABOLIC PANEL
ALBUMIN: 3.5 g/dL (ref 3.5–5.0)
ALT: 18 U/L (ref 14–54)
ANION GAP: 11 (ref 5–15)
AST: 21 U/L (ref 15–41)
Alkaline Phosphatase: 81 U/L (ref 38–126)
BUN: 11 mg/dL (ref 6–20)
CALCIUM: 9.8 mg/dL (ref 8.9–10.3)
CHLORIDE: 102 mmol/L (ref 101–111)
CO2: 25 mmol/L (ref 22–32)
Creatinine, Ser: 0.72 mg/dL (ref 0.44–1.00)
GFR calc non Af Amer: >60 mL/min (ref >60)
Glucose, Bld: 238 mg/dL — ABNORMAL HIGH (ref 65–99)
Potassium: 3.8 mmol/L (ref 3.5–5.1)
Sodium: 138 mmol/L (ref 135–145)
Total Bilirubin: 0.2 mg/dL — ABNORMAL LOW (ref 0.3–1.2)
Total Protein: 6.8 g/dL (ref 6.5–8.1)

## 2016-09-25 MED ORDER — PACLITAXEL CHEMO INJECTION 300 MG/50ML
80.0000 mg/m2 | Freq: Once | INTRAVENOUS | Status: AC
  Administered 2016-09-25: 156 mg via INTRAVENOUS
  Filled 2016-09-25: qty 26

## 2016-09-25 MED ORDER — SODIUM CHLORIDE 0.9 % IV SOLN
Freq: Once | INTRAVENOUS | Status: AC
  Administered 2016-09-25: 13:00:00 via INTRAVENOUS

## 2016-09-25 MED ORDER — POTASSIUM CHLORIDE 20 MEQ/15ML (10%) PO SOLN: 20.0000 meq | Freq: Two times a day (BID) | ORAL | 0 refills | Status: DC

## 2016-09-25 MED ORDER — DIPHENHYDRAMINE HCL 50 MG/ML IJ SOLN
50.0000 mg | Freq: Once | INTRAMUSCULAR | Status: AC
  Administered 2016-09-25: 50 mg via INTRAVENOUS

## 2016-09-25 MED ORDER — FAMOTIDINE IN NACL 20-0.9 MG/50ML-% IV SOLN
20.0000 mg | Freq: Once | INTRAVENOUS | Status: AC
  Administered 2016-09-25: 20 mg via INTRAVENOUS

## 2016-09-25 MED ORDER — FAMOTIDINE IN NACL 20-0.9 MG/50ML-% IV SOLN
INTRAVENOUS | Status: AC
  Filled 2016-09-25: qty 50

## 2016-09-25 MED ORDER — HEPARIN SOD (PORK) LOCK FLUSH 100 UNIT/ML IV SOLN
500.0000 [IU] | Freq: Once | INTRAVENOUS | Status: AC | PRN
  Administered 2016-09-25: 500 [IU]
  Filled 2016-09-25: qty 5

## 2016-09-25 MED ORDER — STERILE WATER FOR INJECTION IJ SOLN
INTRAMUSCULAR | Status: AC
  Filled 2016-09-25: qty 10

## 2016-09-25 MED ORDER — DIPHENHYDRAMINE HCL 50 MG/ML IJ SOLN
INTRAMUSCULAR | Status: AC
  Filled 2016-09-25: qty 1

## 2016-09-25 MED ORDER — SODIUM CHLORIDE 0.9% FLUSH: 10.0000 mL | INTRAVENOUS | Status: DC | PRN

## 2016-09-25 MED ORDER — INSULIN ASPART 100 UNIT/ML ~~LOC~~ SOLN
6.0000 [IU] | Freq: Once | SUBCUTANEOUS | Status: AC
  Administered 2016-09-25: 6 [IU] via SUBCUTANEOUS
  Filled 2016-09-25: qty 0.06

## 2016-09-25 MED ORDER — SODIUM CHLORIDE 0.9 % IV SOLN
20.0000 mg | Freq: Once | INTRAVENOUS | Status: AC
  Administered 2016-09-25: 20 mg via INTRAVENOUS
  Filled 2016-09-25: qty 2

## 2016-09-25 MED ORDER — ALTEPLASE 2 MG IJ SOLR
2.0000 mg | Freq: Once | INTRAMUSCULAR | Status: AC
  Administered 2016-09-25: 2 mg
  Filled 2016-09-25: qty 2

## 2016-09-25 NOTE — Progress Notes (Signed)
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    BRIEF ONCOLOGIC HISTORY:    Breast cancer of upper-outer quadrant of right female breast ([place])   06/24/2016 Mammogram    Diagnostic bilateral mammogram: highly suspicious 3.5 cm mass in the UO R breast with marked enlarged R axillary LN      06/24/2016 Breast US    2.7 x 3.1 x 3.5 cm slightly irregular hypoechoic mass at the 10:00 position of right breast, 6 cm from the nipple.  4.1 x 6 cm right axillar lymph node also identified.      07/01/2016 Pathology Results    Infiltrating ductal carcinoma ER+ 80%, PR-, HER 2-, Ki67 90%      07/01/2016 Initial Biopsy    Ultrasound guided R breast biopsy and R axillary LN biopsy both were clipped post biopsy      07/20/2016 Imaging    MUGA- Upper normal left ventricular ejection fraction of 75% with normal LV wall motion.      07/21/2016 Imaging    CT CAP- 2.9 cm right upper outer quadrant breast mass, consistent with known primary breast carcinoma.  Right axillary and subpectoral lymphadenopathy, consistent with metastatic disease.  Tiny sub-cm bilateral pulmonary nodules are indeterminate, but favor postinflammatory etiology over early metastases. Recommend continued attention on follow-up CT.  No evidence of metastatic disease within the abdomen or pelvis.      07/23/2016 Procedure    Port placed by Dr. Barry Dienes      07/24/2016 -  Neo-Adjuvant Chemotherapy    Adriamycin/Cytoxan x 4, followed by weekly Taxol x 12  (current therapy)       07/30/2016 Imaging    Bone scan: Negative for osseous disease .      09/22/2016 Imaging    CT head- 1.  Normal CT of the brain. No evidence of intracranial metastatic disease. 2. Frothy secretions throughout most of the paranasal sinuses. Correlate clinically for acute sinusitis. This could serve as a source of headache.       INTERVAL HISTORY:  [name] returns for follow-up and consideration for her next cycle of chemotherapy.   She is due for cycle #2 Taxol today.  At her last visit, she had a lot of anxiety with getting her port-a-cath accessed and the associated pain.  It was recommended she apply EMLA cream, as well as take Xanax tab before coming to cancer center.  She followed those instructions today and had less difficulty with getting her port accessed.  Unable to get blood return from port; patient underwent dye study on 09/04/16, which showed "subtotal occlusion of tip of port at [facility], with findings suggesting small fibrin sheath and additional small thrombus plug at catheter tip not excluded."   In recent weeks, she reported several headaches to [place]. In the interim, she had CT brain which was negative for malignancy, but did have findings suggestive of acute sinusitis. She remains on Augmentin at present. Her headaches have improved.    Reports 1 episode of N&V last week; this resolved without intervention. Bowels are moving well. Her appetite is slowly improving. Energy levels are also slowly improving as well.  She isn't sleeping well; she thinks the Restoril gave her  constipation.  She also reports having a hard time swallowing the potassium pills; she is wondering if there is another option.   For fun, she enjoys playing different games on her tablet. She also enjoys spending time with her grandchild.    REVIEW OF SYSTEMS:  Review of Systems  Constitutional: Negative for appetite change, chills, fatigue and fever.  HENT:  Negative.   Eyes: Negative.   Respiratory: Negative.  Negative for cough and shortness of breath.   Cardiovascular: Negative.  Negative for chest pain, leg  swelling and palpitations.  Gastrointestinal: Negative.  Negative for abdominal pain, blood in stool, constipation, diarrhea, nausea and vomiting.  Endocrine: Negative.   Genitourinary: Negative.  Negative for dysuria, hematuria and vaginal bleeding.   Musculoskeletal: Negative.   Skin: Negative.   Neurological: Negative for headaches.  Psychiatric/Behavioral: Positive for sleep disturbance.     PAST MEDICAL/SURGICAL HISTORY:  Past Medical History:  Diagnosis Date  . Arthritis   . Breast cancer of upper-outer quadrant of right female breast ([place]) 07/14/2016  . Cancer ([place])   . Cough    dry  . Diabetes mellitus without complication ([place])   . Hypertension   . Ruptured eardrum    left    Past Surgical History:  Procedure Laterality Date  . ABDOMINAL HYSTERECTOMY     with bilateral bso  . COLONOSCOPY    . PORTACATH PLACEMENT Left 07/23/2016   Procedure: INSERTION PORT-A-CATH;  Surgeon: Stark Klein, MD;  Location: [facility];  Service: General;  Laterality: Left;     SOCIAL HISTORY:  Social History   Social History  . Marital status: Single    Spouse name: N/A  . Number of children: N/A  . Years of education: N/A   Occupational History  . Not on file.   Social History Main Topics  . Smoking status: Former Smoker    Types: Cigarettes  . Smokeless tobacco: Never Used  . Alcohol use No  . Drug use: No  . Sexual activity: Not on file   Other Topics Concern  . Not on file   Social History Narrative  . No narrative on file    FAMILY HISTORY:  History reviewed. No pertinent family history.  CURRENT MEDICATIONS:  Outpatient Encounter Prescriptions as of 09/25/2016  Medication Sig Note  . ALPRAZolam (XANAX) 0.5 MG tablet Take 1 tablet (0.5 mg total) by mouth 3 (three) times daily as needed for anxiety.   [name] amLODipine (NORVASC) 10 MG tablet Take 10 mg by mouth daily.   [name] amoxicillin-clavulanate (AUGMENTIN) 875-125 MG tablet Take 1 tablet by mouth 2  (two) times daily.   [name] aspirin EC 81 MG tablet Take 81 mg by mouth daily.   [name] atorvastatin (LIPITOR) 10 MG tablet Take 10 mg by mouth every evening.   . glyBURIDE-metformin (GLUCOVANCE) 5-500 MG tablet Take 1 tablet by mouth 2 (two) times daily.   . hydrochlorothiazide (HYDRODIURIL) 25 MG tablet Take 25 mg by mouth daily.   [name] ibuprofen (ADVIL,MOTRIN) 800 MG tablet Take 800 mg by mouth every 8 (eight) hours as needed.   . lidocaine-prilocaine (EMLA) cream Apply a quarter size amount to affected area 1 hour prior to coming to chemotherapy.   [name] lisinopril (PRINIVIL,ZESTRIL) 40 MG tablet Take 40 mg by mouth daily.   . ondansetron (ZOFRAN) 8 MG tablet Take 1 tablet (8 mg total) by mouth every 8 (eight) hours as needed for nausea or vomiting. 07/20/2016: Have not started med yet  . oxyCODONE (OXY  IR/ROXICODONE) 5 MG immediate release tablet Take 1-2 tablets (5-10 mg total) by mouth every 6 (six) hours as needed for moderate pain, severe pain or breakthrough pain.   [name] prochlorperazine (COMPAZINE) 10 MG tablet Take 1 tablet (10 mg total) by mouth every 6 (six) hours as needed for nausea or vomiting. 07/20/2016: Have not started med yet   . sitaGLIPtin (JANUVIA) 100 MG tablet Take 100 mg by mouth daily.   . [DISCONTINUED] KLOR-CON M20 20 MEQ tablet TAKE 2 TABLETS (40 MEQ TOTAL) BY MOUTH DAILY.   [name] potassium chloride 20 MEQ/15ML (10%) SOLN Take 15 mLs (20 mEq total) by mouth 2 (two) times daily.   . [DISCONTINUED] temazepam (RESTORIL) 30 MG capsule Take 1 capsule (30 mg total) by mouth at bedtime as needed for sleep. (Patient not taking: Reported on 09/25/2016)    No facility-administered encounter medications on file as of 09/25/2016.     ALLERGIES:  No Known Allergies   PHYSICAL EXAM:  ECOG Performance status: 1 - Symptomatic, but independent.   Vitals:   09/25/16 0957  BP: 134/77  Pulse: (!) 107  Resp: 18  Temp: 97.9 F (36.6 C)   Filed Weights   09/25/16 0957  Weight: 177 lb 1.6 oz (80.3  kg)    Physical Exam  Constitutional: She is oriented to person, place, and time and well-developed, well-nourished, and in no distress.  HENT:  Head: Normocephalic.  Mouth/Throat: Oropharynx is clear and moist. No oropharyngeal exudate (tongue with areas of hyperpigmentation (as expected chemotherapy treatment changes) ).  Eyes: Conjunctivae are normal. No scleral icterus.  Neck: Normal range of motion. Neck supple.  Cardiovascular: Normal rate, regular rhythm and normal heart sounds.   Pulmonary/Chest: Effort normal and breath sounds normal. No respiratory distress. She has no wheezes.  Abdominal: Soft. Bowel sounds are normal. There is no tenderness. There is no rebound and no guarding.  Musculoskeletal: Normal range of motion. She exhibits no edema.  Lymphadenopathy:    She has no cervical adenopathy.  Neurological: She is alert and oriented to person, place, and time.  Slurred speech (patient reportedly took 2 Xanax tabs before coming to clinic today).   Skin: Skin is warm and dry. No rash noted.  Nail changes as expected with chemotherapy   Psychiatric: Mood, memory and judgment normal.     LABORATORY DATA:  I have reviewed the labs as listed.  CBC    Component Value Date/Time   WBC 3.6 (L) 09/25/2016 1027   RBC 3.38 (L) 09/25/2016 1027   HGB 10.2 (L) 09/25/2016 1027   HCT 29.5 (L) 09/25/2016 1027   PLT 348 09/25/2016 1027   MCV 87.3 09/25/2016 1027   MCH 30.2 09/25/2016 1027   MCHC 34.6 09/25/2016 1027   RDW 16.6 (H) 09/25/2016 1027   LYMPHSABS 0.7 09/25/2016 1027   MONOABS 0.4 09/25/2016 1027   EOSABS 0.0 09/25/2016 1027   BASOSABS 0.0 09/25/2016 1027   CMP Latest Ref Rng & Units 09/25/2016 09/18/2016 09/04/2016  Glucose 65 - 99 mg/dL 238(H) 247(H) 215(H)  BUN 6 - 20 mg/dL _0 Creatinine 0.44 - 1.00 mg/dL 0.72 0.92 0.97  Sodium 135 - 145 mmol/L 138 134(L) 136  Potassium 3.5 - 5.1 mmol/L 3.8 3.8 3.2(L)  Chloride 101 - 111 mmol/L 102 99(L) 101  CO2 22 - 32  mmol/L _1 Calcium 8.9 - 10.3 mg/dL 9.8 9.5 9.5  Total Protein 6.5 - 8.1 g/dL 6.8 7.1 7.1  Total Bilirubin 0.3 -  1.2 mg/dL 0.2(L) 0.4 0.4  Alkaline Phos 38 - 126 U/L 81 87 103  AST 15 - 41 U/L _0 ALT 14 - 54 U/L _1 PENDING LABS:    DIAGNOSTIC IMAGING:  Port-a-cath dye study: 09/04/16     CT brain: 09/21/16    PATHOLOGY:  Right breast and axillary LN biopsy: 07/01/16        ASSESSMENT & PLAN:   Stage IIB (T2N1M0) invasive ductal carcinoma, ER+/PR-/HER2-: -Currently undergoing neoadjuvant chemotherapy; s/p Adriamycin/Cytoxan x 4 cycles; now getting weekly Taxol with plans for 12 cycles. Briefly reviewed the plan after she completes neoadjuvant chemotherapy will be breast surgery, adjuvant radiation therapy, and anti-estrogen oral therapy.   -Labs reviewed and are adequate for treatment today. Proceed with cycle #2 today.  We briefly reviewed Taxol side effects again today. Thus far, she is tolerating well.   -Return weekly for Taxol as scheduled.  -Return to cancer center for follow-up visit in 3 weeks.    Headaches/Acute sinusitis:  -CT head negative for malignancy, but there was noted sinusitis on imaging. Headaches are improved. Complete current course of Augmentin.    Hypokalemia:  -She has chronic issues with low potassium. Her serum potassium is normal. She reports having trouble swallowing potassium pills; I will change her prescription to oral potassium solution 20 mEq BID.   Anxiety/Insomnia:  -EMLA cream and pre-medication with Xanax before coming to clinic were effective today. Her speech was a bit slurred, likely secondary to Xanax.  -Recommended she take the Xanax as prescribed at bedtime to help her sleep, since it has been helpful for her anxiety. She should not take Temazepam and Xanax at the same time.    Port-a-cath:  -Absent blood return after tPA; dye study showed fibrin sheath/possible tip occlusion. Will continue to try tPA at  subsequent visits. If no blood return, then will require peripheral blood draws for labs. It is okay to continue to administer chemotherapy through port, as dye study confirmed that port placement is correct and functioning well, with the exception of blood return.     Dispo:  -Continue weekly Taxol.  -Return to cancer center in 3 weeks for follow-up visit and chemotherapy.    All questions were answered to patient's stated satisfaction. Encouraged patient to call with any new concerns or questions before her next visit to the cancer center and we can certain see her sooner, if needed.     Orders placed this encounter:  No orders of the defined types were placed in this encounter.     Mike Craze, NP [place] [phone]

## 2016-09-25 NOTE — Progress Notes (Signed)
Alteplase placed in Port line at 1050.  Attempt to aspirate blood from line at 1130, no blood return.  Attempt to aspirate blood return at 1235 with no blood return.  Alteplase aspirated to 69ml, but no further alteplase was able to be aspirated.  Line flushed well with saline and treatment started.  Insulin 6 units given SQ in the right upper abdomen per NP orders.  Patient tolerated infusion well.  VSS.  Patient ambulatory and stable upon discharge from clinic.

## 2016-09-25 NOTE — Patient Instructions (Addendum)
[facility] at [facility] Discharge Instructions  RECOMMENDATIONS MADE BY THE CONSULTANT AND ANY TEST RESULTS WILL BE SENT TO YOUR REFERRING PHYSICIAN.  You saw Mike Craze, NP, today Try Xanax (Alprazolam) at bedtime for sleep. See Amy at checkout for appointments.  Thank you for choosing [place] at [facility] to provide your oncology and hematology care.  To afford each patient quality time with our provider, please arrive at least 15 minutes before your scheduled appointment time.    If you have a lab appointment with the [facility] please come in thru the  Main Entrance and check in at the main information desk  You need to re-schedule your appointment should you arrive 10 or more minutes late.  We strive to give you quality time with our providers, and arriving late affects you and other patients whose appointments are after yours.  Also, if you no show three or more times for appointments you may be dismissed from the clinic at the providers discretion.     Again, thank you for choosing [facility].  Our hope is that these requests will decrease the amount of time that you wait before being seen by our physicians.       _____________________________________________________________  Should you have questions after your visit to [facility], please contact our office at [phone] between the hours of 8:30 a.m. and 4:30 p.m.  Voicemails left after 4:30 p.m. will not be returned until the following business day.  For prescription refill requests, have your pharmacy contact our office.       Resources For Cancer Patients and their Caregivers ? American Cancer Society: Can assist with transportation, wigs, general needs, runs Look Good Feel Better.        ([phone] [phone] ? Cancer Care: Provides financial assistance, online support groups, medication/co-pay assistance.  1-800-813-HOPE (775)481-6487) ? [facility] Assists [place] cancer patients and their families through emotional , educational and financial support.  (424)653-9697 ? [place] DSS Where to apply for food stamps, Medicaid and utility assistance. 407-672-4243 ? RCATS: Transportation to medical appointments. 661-623-1005 ? Social Security Administration: May apply for disability if have a Stage IV cancer. 850-427-0753 208-455-7769 ? [employer], Disability and Transit Services: Assists with nutrition, care and transit needs. [place] Support Programs: @10RELATIVEDAYS @ > Cancer Support Group  2nd Tuesday of the month 1pm-2pm, Journey Room  > Creative Journey  3rd Tuesday of the month 1130am-1pm, Journey Room  > Look Good Feel Better  1st Wednesday of the month 10am-12 noon, Journey Room (Call [place] to register [phone])

## 2016-09-25 NOTE — Patient Instructions (Signed)
[facility] Discharge Instructions for Patients Receiving Chemotherapy   Beginning January 23rd 2017 lab work for the [facility] will be done in the  Main lab at [facility] on 1st floor. If you have a lab appointment with the [facility] please come in thru the  Main Entrance and check in at the main information desk   Today you received the following chemotherapy agent: Taxol.     If you develop nausea and vomiting, or diarrhea that is not controlled by your medication, call the clinic.  The clinic phone number is [phone]. Office hours are Monday-Friday 8:30am-5:00pm.  BELOW ARE SYMPTOMS THAT SHOULD BE REPORTED IMMEDIATELY:  *FEVER GREATER THAN 101.0 F  *CHILLS WITH OR WITHOUT FEVER  NAUSEA AND VOMITING THAT IS NOT CONTROLLED WITH YOUR NAUSEA MEDICATION  *UNUSUAL SHORTNESS OF BREATH  *UNUSUAL BRUISING OR BLEEDING  TENDERNESS IN MOUTH AND THROAT WITH OR WITHOUT PRESENCE OF ULCERS  *URINARY PROBLEMS  *BOWEL PROBLEMS  UNUSUAL RASH Items with * indicate a potential emergency and should be followed up as soon as possible. If you have an emergency after office hours please contact your primary care physician or go to the nearest emergency department.  Please call the clinic during office hours if you have any questions or concerns.   You may also contact the Patient Navigator at [phone] should you have any questions or need assistance in obtaining follow up care.      Resources For Cancer Patients and their Caregivers ? American Cancer Society: Can assist with transportation, wigs, general needs, runs Look Good Feel Better.        [phone] ? Cancer Care: Provides financial assistance, online support groups, medication/co-pay assistance.  1-800-813-HOPE 7266131034) ? [place] Assists [place] cancer patients and their families through emotional , educational and financial support.   2050580962 ? [place] DSS Where to apply for food stamps, Medicaid and utility assistance. 909-197-6262 ? RCATS: Transportation to medical appointments. (970)608-1142 ? Social Security Administration: May apply for disability if have a Stage IV cancer. 980-015-0782 606-185-3709 ? [employer], Disability and Transit Services: Assists with nutrition, care and transit needs. 616-034-8405

## 2016-10-01 ENCOUNTER — Encounter (HOSPITAL_BASED_OUTPATIENT_CLINIC_OR_DEPARTMENT_OTHER): Payer: No Typology Code available for payment source

## 2016-10-01 ENCOUNTER — Encounter (HOSPITAL_COMMUNITY): Payer: Self-pay

## 2016-10-01 ENCOUNTER — Ambulatory Visit (HOSPITAL_COMMUNITY): Payer: PRIVATE HEALTH INSURANCE

## 2016-10-01 VITALS — BP 131/88 | HR 97 | Temp 98.0°F | Resp 18 | Wt 182.0 lb

## 2016-10-01 DIAGNOSIS — Z170 Estrogen receptor positive status [ER+]: Principal | ICD-10-CM

## 2016-10-01 DIAGNOSIS — Z5111 Encounter for antineoplastic chemotherapy: Secondary | ICD-10-CM

## 2016-10-01 DIAGNOSIS — C50411 Malignant neoplasm of upper-outer quadrant of right female breast: Secondary | ICD-10-CM | POA: Diagnosis not present

## 2016-10-01 DIAGNOSIS — Z01818 Encounter for other preprocedural examination: Secondary | ICD-10-CM | POA: Diagnosis not present

## 2016-10-01 LAB — CBC WITH DIFFERENTIAL/PLATELET
Basophils Absolute: 0 10*3/uL (ref 0.0–0.1)
Basophils Relative: 1 %
EOS ABS: 0 10*3/uL (ref 0.0–0.7)
Eosinophils Relative: 0 %
HEMATOCRIT: 30.9 % — AB (ref 36.0–46.0)
HEMOGLOBIN: 10.8 g/dL — AB (ref 12.0–15.0)
LYMPHS PCT: 24 %
Lymphs Abs: 0.6 10*3/uL — ABNORMAL LOW (ref 0.7–4.0)
MCH: 30.8 pg (ref 26.0–34.0)
MCHC: 35 g/dL (ref 30.0–36.0)
MCV: 88 fL (ref 78.0–100.0)
MONOS PCT: 9 %
Monocytes Absolute: 0.2 10*3/uL (ref 0.1–1.0)
NEUTROS ABS: 1.5 10*3/uL — AB (ref 1.7–7.7)
NEUTROS PCT: 66 %
Platelets: 282 10*3/uL (ref 150–400)
RBC: 3.51 MIL/uL — AB (ref 3.87–5.11)
RDW: 17.4 % — ABNORMAL HIGH (ref 11.5–15.5)
WBC: 2.3 10*3/uL — AB (ref 4.0–10.5)

## 2016-10-01 LAB — COMPREHENSIVE METABOLIC PANEL
ALK PHOS: 72 U/L (ref 38–126)
ALT: 26 U/L (ref 14–54)
ANION GAP: 9 (ref 5–15)
AST: 26 U/L (ref 15–41)
Albumin: 3.5 g/dL (ref 3.5–5.0)
BILIRUBIN TOTAL: 0.7 mg/dL (ref 0.3–1.2)
BUN: 9 mg/dL (ref 6–20)
CO2: 25 mmol/L (ref 22–32)
CREATININE: 0.75 mg/dL (ref 0.44–1.00)
Calcium: 9.6 mg/dL (ref 8.9–10.3)
Chloride: 100 mmol/L — ABNORMAL LOW (ref 101–111)
GFR calc non Af Amer: >60 mL/min (ref >60)
Glucose, Bld: 279 mg/dL — ABNORMAL HIGH (ref 65–99)
Potassium: 3.7 mmol/L (ref 3.5–5.1)
Sodium: 134 mmol/L — ABNORMAL LOW (ref 135–145)
Total Protein: 6.5 g/dL (ref 6.5–8.1)

## 2016-10-01 MED ORDER — SODIUM CHLORIDE 0.9 % IV SOLN
20.0000 mg | Freq: Once | INTRAVENOUS | Status: AC
  Administered 2016-10-01: 20 mg via INTRAVENOUS
  Filled 2016-10-01: qty 2

## 2016-10-01 MED ORDER — DIPHENHYDRAMINE HCL 50 MG/ML IJ SOLN
50.0000 mg | Freq: Once | INTRAMUSCULAR | Status: AC
  Administered 2016-10-01: 50 mg via INTRAVENOUS

## 2016-10-01 MED ORDER — HEPARIN SOD (PORK) LOCK FLUSH 100 UNIT/ML IV SOLN
500.0000 [IU] | Freq: Once | INTRAVENOUS | Status: AC | PRN
  Administered 2016-10-01: 500 [IU]

## 2016-10-01 MED ORDER — DIPHENHYDRAMINE HCL 50 MG/ML IJ SOLN
INTRAMUSCULAR | Status: AC
  Filled 2016-10-01: qty 1

## 2016-10-01 MED ORDER — SODIUM CHLORIDE 0.9% FLUSH
10.0000 mL | INTRAVENOUS | Status: DC | PRN
  Administered 2016-10-01: 10 mL
  Filled 2016-10-01: qty 10

## 2016-10-01 MED ORDER — FAMOTIDINE IN NACL 20-0.9 MG/50ML-% IV SOLN
20.0000 mg | Freq: Once | INTRAVENOUS | Status: AC
  Administered 2016-10-01: 20 mg via INTRAVENOUS

## 2016-10-01 MED ORDER — PACLITAXEL CHEMO INJECTION 300 MG/50ML
80.0000 mg/m2 | Freq: Once | INTRAVENOUS | Status: AC
  Administered 2016-10-01: 156 mg via INTRAVENOUS
  Filled 2016-10-01: qty 26

## 2016-10-01 MED ORDER — FAMOTIDINE IN NACL 20-0.9 MG/50ML-% IV SOLN
INTRAVENOUS | Status: AC
  Filled 2016-10-01: qty 50

## 2016-10-01 MED ORDER — SODIUM CHLORIDE 0.9 % IV SOLN
Freq: Once | INTRAVENOUS | Status: AC
  Administered 2016-10-01: 13:00:00 via INTRAVENOUS

## 2016-10-01 NOTE — Progress Notes (Signed)
Labs reviewed with Dr Talbert Cage. Proceed with treatment.  FMLA papers given to Angie to give to [place]. When she returns to work.   Chemotherapy given today per orders. Patient tolerated well without problems. Vitals stable and discharged home from clinic via wheelchair. Follow up as scheduled.

## 2016-10-01 NOTE — Patient Instructions (Signed)
[facility] Discharge Instructions for Patients Receiving Chemotherapy   Beginning January 23rd 2017 lab work for the Cancer Center will be done in the  Main lab at [facility] on 1st floor. If you have a lab appointment with the Cancer Center please come in thru the  Main Entrance and check in at the main information desk   Today you received the following chemotherapy agents   To help prevent nausea and vomiting after your treatment, we encourage you to take your nausea medication     If you develop nausea and vomiting, or diarrhea that is not controlled by your medication, call the clinic.  The clinic phone number is [phone]. Office hours are Monday-Friday 8:30am-5:00pm.  BELOW ARE SYMPTOMS THAT SHOULD BE REPORTED IMMEDIATELY:  *FEVER GREATER THAN 101.0 F  *CHILLS WITH OR WITHOUT FEVER  NAUSEA AND VOMITING THAT IS NOT CONTROLLED WITH YOUR NAUSEA MEDICATION  *UNUSUAL SHORTNESS OF BREATH  *UNUSUAL BRUISING OR BLEEDING  TENDERNESS IN MOUTH AND THROAT WITH OR WITHOUT PRESENCE OF ULCERS  *URINARY PROBLEMS  *BOWEL PROBLEMS  UNUSUAL RASH Items with * indicate a potential emergency and should be followed up as soon as possible. If you have an emergency after office hours please contact your primary care physician or go to the nearest emergency department.  Please call the clinic during office hours if you have any questions or concerns.   You may also contact the Patient Navigator at [phone] should you have any questions or need assistance in obtaining follow up care.      Resources For Cancer Patients and their Caregivers ? American Cancer Society: Can assist with transportation, wigs, general needs, runs Look Good Feel Better.        1-888-227-6333 ? Cancer Care: Provides financial assistance, online support groups, medication/co-pay assistance.  1-800-813-HOPE (4673) ? [facility] Assists [place] cancer  patients and their families through emotional , educational and financial support.  336-427-4357 ? [place] DSS Where to apply for food stamps, Medicaid and utility assistance. 336-342-1394 ? RCATS: Transportation to medical appointments. 336-347-2287 ? Social Security Administration: May apply for disability if have a Stage IV cancer. 336-342-7796 1-800-772-1213 ? [place] [facility], Disability and Transit Services: Assists with nutrition, care and transit needs. [phone]         

## 2016-10-09 ENCOUNTER — Ambulatory Visit (HOSPITAL_COMMUNITY): Payer: PRIVATE HEALTH INSURANCE | Admitting: Adult Health

## 2016-10-09 ENCOUNTER — Encounter (HOSPITAL_COMMUNITY): Payer: No Typology Code available for payment source | Attending: Hematology & Oncology

## 2016-10-09 VITALS — BP 120/79 | HR 105 | Temp 98.2°F | Resp 18 | Wt 182.4 lb

## 2016-10-09 DIAGNOSIS — Z5111 Encounter for antineoplastic chemotherapy: Secondary | ICD-10-CM | POA: Diagnosis not present

## 2016-10-09 DIAGNOSIS — C50411 Malignant neoplasm of upper-outer quadrant of right female breast: Secondary | ICD-10-CM

## 2016-10-09 DIAGNOSIS — Z01818 Encounter for other preprocedural examination: Secondary | ICD-10-CM | POA: Diagnosis not present

## 2016-10-09 DIAGNOSIS — Z170 Estrogen receptor positive status [ER+]: Secondary | ICD-10-CM | POA: Insufficient documentation

## 2016-10-09 LAB — COMPREHENSIVE METABOLIC PANEL
ALBUMIN: 3.6 g/dL (ref 3.5–5.0)
ALT: 30 U/L (ref 14–54)
AST: 22 U/L (ref 15–41)
Alkaline Phosphatase: 86 U/L (ref 38–126)
Anion gap: 10 (ref 5–15)
BILIRUBIN TOTAL: 0.7 mg/dL (ref 0.3–1.2)
BUN: 11 mg/dL (ref 6–20)
CHLORIDE: 98 mmol/L — AB (ref 101–111)
CO2: 26 mmol/L (ref 22–32)
CREATININE: 0.81 mg/dL (ref 0.44–1.00)
Calcium: 9.8 mg/dL (ref 8.9–10.3)
GFR calc Af Amer: >60 mL/min (ref >60)
GLUCOSE: 306 mg/dL — AB (ref 65–99)
Potassium: 3.3 mmol/L — ABNORMAL LOW (ref 3.5–5.1)
Sodium: 134 mmol/L — ABNORMAL LOW (ref 135–145)
TOTAL PROTEIN: 6.5 g/dL (ref 6.5–8.1)

## 2016-10-09 LAB — CBC WITH DIFFERENTIAL/PLATELET
BASOS ABS: 0 10*3/uL (ref 0.0–0.1)
BASOS PCT: 0 %
Eosinophils Absolute: 0 10*3/uL (ref 0.0–0.7)
Eosinophils Relative: 1 %
HEMATOCRIT: 31.6 % — AB (ref 36.0–46.0)
Hemoglobin: 11.1 g/dL — ABNORMAL LOW (ref 12.0–15.0)
LYMPHS PCT: 25 %
Lymphs Abs: 0.8 10*3/uL (ref 0.7–4.0)
MCH: 31.4 pg (ref 26.0–34.0)
MCHC: 35.1 g/dL (ref 30.0–36.0)
MCV: 89.3 fL (ref 78.0–100.0)
Monocytes Absolute: 0.3 10*3/uL (ref 0.1–1.0)
Monocytes Relative: 9 %
Neutro Abs: 2.1 10*3/uL (ref 1.7–7.7)
Neutrophils Relative %: 65 %
Platelets: 253 10*3/uL (ref 150–400)
RBC: 3.54 MIL/uL — AB (ref 3.87–5.11)
RDW: 17.8 % — ABNORMAL HIGH (ref 11.5–15.5)
WBC: 3.3 10*3/uL — AB (ref 4.0–10.5)

## 2016-10-09 MED ORDER — SODIUM CHLORIDE 0.9 % IV SOLN
20.0000 mg | Freq: Once | INTRAVENOUS | Status: AC
  Administered 2016-10-09: 20 mg via INTRAVENOUS
  Filled 2016-10-09: qty 2

## 2016-10-09 MED ORDER — FAMOTIDINE IN NACL 20-0.9 MG/50ML-% IV SOLN
20.0000 mg | Freq: Once | INTRAVENOUS | Status: AC
  Administered 2016-10-09: 20 mg via INTRAVENOUS
  Filled 2016-10-09: qty 50

## 2016-10-09 MED ORDER — HEPARIN SOD (PORK) LOCK FLUSH 100 UNIT/ML IV SOLN
500.0000 [IU] | Freq: Once | INTRAVENOUS | Status: AC | PRN
  Administered 2016-10-09: 500 [IU]
  Filled 2016-10-09: qty 5

## 2016-10-09 MED ORDER — SODIUM CHLORIDE 0.9 % IV SOLN
Freq: Once | INTRAVENOUS | Status: AC
  Administered 2016-10-09: 12:00:00 via INTRAVENOUS

## 2016-10-09 MED ORDER — DIPHENHYDRAMINE HCL 50 MG/ML IJ SOLN
50.0000 mg | Freq: Once | INTRAMUSCULAR | Status: AC
Start: 2016-10-09 — End: 2016-10-09
  Administered 2016-10-09: 50 mg via INTRAVENOUS
  Filled 2016-10-09: qty 1

## 2016-10-09 MED ORDER — PACLITAXEL CHEMO INJECTION 300 MG/50ML
80.0000 mg/m2 | Freq: Once | INTRAVENOUS | Status: AC
  Administered 2016-10-09: 156 mg via INTRAVENOUS
  Filled 2016-10-09: qty 26

## 2016-10-09 NOTE — Patient Instructions (Signed)
[facility] Discharge Instructions for Patients Receiving Chemotherapy   Beginning January 23rd 2017 lab work for the [facility] will be done in the  Main lab at [facility] on 1st floor. If you have a lab appointment with the [facility] please come in thru the  Main Entrance and check in at the main information desk   Today you received the following chemotherapy agents taxol week 4 of 12.  To help prevent nausea and vomiting after your treatment, we encourage you to take your nausea medication as instructed.  If you develop nausea and vomiting, or diarrhea that is not controlled by your medication, call the clinic.  The clinic phone number is [phone]. Office hours are Monday-Friday 8:30am-5:00pm.  BELOW ARE SYMPTOMS THAT SHOULD BE REPORTED IMMEDIATELY:  *FEVER GREATER THAN 101.0 F  *CHILLS WITH OR WITHOUT FEVER  NAUSEA AND VOMITING THAT IS NOT CONTROLLED WITH YOUR NAUSEA MEDICATION  *UNUSUAL SHORTNESS OF BREATH  *UNUSUAL BRUISING OR BLEEDING  TENDERNESS IN MOUTH AND THROAT WITH OR WITHOUT PRESENCE OF ULCERS  *URINARY PROBLEMS  *BOWEL PROBLEMS  UNUSUAL RASH Items with * indicate a potential emergency and should be followed up as soon as possible. If you have an emergency after office hours please contact your primary care physician or go to the nearest emergency department.  Please call the clinic during office hours if you have any questions or concerns.   You may also contact the Patient Navigator at [phone] should you have any questions or need assistance in obtaining follow up care.      Resources For Cancer Patients and their Caregivers ? American Cancer Society: Can assist with transportation, wigs, general needs, runs Look Good Feel Better.        316-054-6712 ? Cancer Care: Provides financial assistance, online support groups, medication/co-pay assistance.  1-800-813-HOPE 437-457-5314) ? [facility] Assists [place] cancer patients and their families through emotional , educational and financial support.  248-200-3188 ? [place] DSS Where to apply for food stamps, Medicaid and utility assistance. (304) 176-8605 ? RCATS: Transportation to medical appointments. 207-215-8426 ? Social Security Administration: May apply for disability if have a Stage IV cancer. 262-883-3950 660-146-0321 ? [employer], Disability and Transit Services: Assists with nutrition, care and transit needs. 346-814-0011

## 2016-10-09 NOTE — Progress Notes (Signed)
Tolerated chemo well. Stable on discharge home with family via wheelchair. 

## 2016-10-16 ENCOUNTER — Encounter (HOSPITAL_COMMUNITY): Payer: Self-pay

## 2016-10-16 ENCOUNTER — Encounter (HOSPITAL_BASED_OUTPATIENT_CLINIC_OR_DEPARTMENT_OTHER): Payer: No Typology Code available for payment source

## 2016-10-16 ENCOUNTER — Encounter (HOSPITAL_BASED_OUTPATIENT_CLINIC_OR_DEPARTMENT_OTHER): Payer: No Typology Code available for payment source | Admitting: Oncology

## 2016-10-16 VITALS — BP 117/73 | HR 92 | Temp 98.1°F | Resp 16 | Wt 181.6 lb

## 2016-10-16 DIAGNOSIS — Z170 Estrogen receptor positive status [ER+]: Secondary | ICD-10-CM

## 2016-10-16 DIAGNOSIS — Z5111 Encounter for antineoplastic chemotherapy: Secondary | ICD-10-CM

## 2016-10-16 DIAGNOSIS — C50411 Malignant neoplasm of upper-outer quadrant of right female breast: Secondary | ICD-10-CM

## 2016-10-16 DIAGNOSIS — Z01818 Encounter for other preprocedural examination: Secondary | ICD-10-CM | POA: Diagnosis not present

## 2016-10-16 LAB — CBC WITH DIFFERENTIAL/PLATELET
Basophils Absolute: 0 10*3/uL (ref 0.0–0.1)
Basophils Relative: 1 %
EOS PCT: 1 %
Eosinophils Absolute: 0 10*3/uL (ref 0.0–0.7)
HCT: 32.2 % — ABNORMAL LOW (ref 36.0–46.0)
HEMOGLOBIN: 11.3 g/dL — AB (ref 12.0–15.0)
LYMPHS ABS: 0.8 10*3/uL (ref 0.7–4.0)
LYMPHS PCT: 22 %
MCH: 31.4 pg (ref 26.0–34.0)
MCHC: 35.1 g/dL (ref 30.0–36.0)
MCV: 89.4 fL (ref 78.0–100.0)
MONOS PCT: 7 %
Monocytes Absolute: 0.2 10*3/uL (ref 0.1–1.0)
NEUTROS PCT: 69 %
Neutro Abs: 2.5 10*3/uL (ref 1.7–7.7)
Platelets: 247 10*3/uL (ref 150–400)
RBC: 3.6 MIL/uL — AB (ref 3.87–5.11)
RDW: 16.9 % — ABNORMAL HIGH (ref 11.5–15.5)
WBC: 3.6 10*3/uL — AB (ref 4.0–10.5)

## 2016-10-16 LAB — COMPREHENSIVE METABOLIC PANEL
ALK PHOS: 78 U/L (ref 38–126)
ALT: 33 U/L (ref 14–54)
AST: 26 U/L (ref 15–41)
Albumin: 3.6 g/dL (ref 3.5–5.0)
Anion gap: 10 (ref 5–15)
BILIRUBIN TOTAL: 0.5 mg/dL (ref 0.3–1.2)
BUN: 11 mg/dL (ref 6–20)
CALCIUM: 9.6 mg/dL (ref 8.9–10.3)
CO2: 23 mmol/L (ref 22–32)
Chloride: 100 mmol/L — ABNORMAL LOW (ref 101–111)
Creatinine, Ser: 0.76 mg/dL (ref 0.44–1.00)
Glucose, Bld: 317 mg/dL — ABNORMAL HIGH (ref 65–99)
Potassium: 3.6 mmol/L (ref 3.5–5.1)
Sodium: 133 mmol/L — ABNORMAL LOW (ref 135–145)
TOTAL PROTEIN: 6.5 g/dL (ref 6.5–8.1)

## 2016-10-16 MED ORDER — SODIUM CHLORIDE 0.9 % IV SOLN
Freq: Once | INTRAVENOUS | Status: AC
  Administered 2016-10-16: 12:00:00 via INTRAVENOUS

## 2016-10-16 MED ORDER — SODIUM CHLORIDE 0.9% FLUSH: 10.0000 mL | INTRAVENOUS | Status: DC | PRN

## 2016-10-16 MED ORDER — HEPARIN SOD (PORK) LOCK FLUSH 100 UNIT/ML IV SOLN
500.0000 [IU] | Freq: Once | INTRAVENOUS | Status: AC | PRN
  Administered 2016-10-16: 500 [IU]
  Filled 2016-10-16: qty 5

## 2016-10-16 MED ORDER — PACLITAXEL CHEMO INJECTION 300 MG/50ML
80.0000 mg/m2 | Freq: Once | INTRAVENOUS | Status: AC
  Administered 2016-10-16: 156 mg via INTRAVENOUS
  Filled 2016-10-16: qty 26

## 2016-10-16 MED ORDER — SODIUM CHLORIDE 0.9 % IV SOLN
20.0000 mg | Freq: Once | INTRAVENOUS | Status: AC
  Administered 2016-10-16: 20 mg via INTRAVENOUS
  Filled 2016-10-16: qty 2

## 2016-10-16 MED ORDER — FAMOTIDINE IN NACL 20-0.9 MG/50ML-% IV SOLN
20.0000 mg | Freq: Once | INTRAVENOUS | Status: AC
  Administered 2016-10-16: 20 mg via INTRAVENOUS
  Filled 2016-10-16: qty 50

## 2016-10-16 MED ORDER — DIPHENHYDRAMINE HCL 50 MG/ML IJ SOLN
50.0000 mg | Freq: Once | INTRAMUSCULAR | Status: AC
  Administered 2016-10-16: 50 mg via INTRAVENOUS
  Filled 2016-10-16: qty 1

## 2016-10-16 NOTE — Progress Notes (Signed)
[name]  PROGRESS NOTE  Patient Care Team: Monico Blitz, MD as PCP - General (Internal Medicine)  CHIEF COMPLAINTS/PURPOSE OF CONSULTATION:   clinical T2 N1 M0 Stage 2b, ER 80% positive, PR negative, HER-2 negative, Ki-67 90% of right breast.   Breast cancer of upper-outer quadrant of right female breast ([place])   06/24/2016 Mammogram    Diagnostic bilateral mammogram: highly suspicious 3.5 cm mass in the UO R breast with marked enlarged R axillary LN      06/24/2016 Breast US    2.7 x 3.1 x 3.5 cm slightly irregular hypoechoic mass at the 10:00 position of right breast, 6 cm from the nipple.  4.1 x 6 cm right axillar lymph node also identified.      07/01/2016 Pathology Results    Infiltrating ductal carcinoma ER+ 80%, PR-, HER 2-, Ki67 90%      07/01/2016 Initial Biopsy    Ultrasound guided R breast biopsy and R axillary LN biopsy both were clipped post biopsy      07/20/2016 Imaging    MUGA- Upper normal left ventricular ejection fraction of 75% with normal LV wall motion.      07/21/2016 Imaging    CT CAP- 2.9 cm right upper outer quadrant breast mass, consistent with known primary breast carcinoma.  Right axillary and subpectoral lymphadenopathy, consistent with metastatic disease.  Tiny sub-cm bilateral pulmonary nodules are indeterminate, but favor postinflammatory etiology over early metastases. Recommend continued attention on follow-up CT.  No evidence of metastatic disease within the abdomen or pelvis.      07/23/2016 Procedure    Port placed by Dr. Barry Dienes      07/24/2016 -  Neo-Adjuvant Chemotherapy    Adriamycin/Cytoxan x 4, followed by weekly Taxol x 12  (current therapy)       07/30/2016 Imaging    Bone scan: Negative for osseous disease .      09/22/2016 Imaging    CT head- 1. Normal CT of the brain. No evidence of intracranial metastatic disease. 2. Frothy secretions throughout most of the paranasal sinuses. Correlate  clinically for acute sinusitis. This could serve as a source of headache.      HISTORY OF PRESENTING ILLNESS:  [name] 61 y.o. female is here for follow up of right breast cancer. Started neo-adjuvant chemotherapy Adriamycin/Cytoxan with weekly Taxol on 07/24/2016.   [name] is doing well today. [name] presents for cycle 5 today. The RN has not been able to get any blood out of her port, but it flushes fine. [name] is still able to receive treatment through it. Denies any neuropathy, loss of appetite, weight loss, chest pain, SOB, or any other concerns.   MEDICAL HISTORY:  Past Medical History:  Diagnosis Date  . Arthritis   . Breast cancer of upper-outer quadrant of right female breast ([place]) 07/14/2016  . Cancer ([place])   . Cough    dry  . Diabetes mellitus without complication ([place])   . Hypertension   . Ruptured eardrum    left     SURGICAL HISTORY: Past Surgical History:  Procedure Laterality Date  . ABDOMINAL HYSTERECTOMY     with bilateral bso  . COLONOSCOPY    . PORTACATH PLACEMENT Left 07/23/2016   Procedure: INSERTION PORT-A-CATH;  Surgeon: Stark Klein, MD;  Location: [facility];  Service: General;  Laterality: Left;    SOCIAL HISTORY: Social History   Social History  . Marital status: Single    Spouse name: N/A  .  Number of children: N/A  . Years of education: N/A   Occupational History  . Not on file.   Social History Main Topics  . Smoking status: Former Smoker    Types: Cigarettes  . Smokeless tobacco: Never Used  . Alcohol use No  . Drug use: No  . Sexual activity: Not on file   Other Topics Concern  . Not on file   Social History Narrative  . No narrative on file   [name] is single [name] doesn't smoke, no alcohol use.  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 Loves flowers and doing yard work. [name] loves games on her tablet.   FAMILY HISTORY: History reviewed.  No pertinent family history. 2 sisters 5 brothers No  family history of breast cancer or prostate cancer  Sister has history of colon cancer- [name] had 2 hernias removed Mom- 24 deceased, was on dialysis and heart got weaker and weaker.  Dad- 58 deceased, - was a heavy drinker   Has 2 boys, 7 and 1 years of age 53 grandson- 20 months old   ALLERGIES:  has No Known Allergies.  MEDICATIONS:  Current Outpatient Prescriptions  Medication Sig Dispense Refill  . ALPRAZolam (XANAX) 0.5 MG tablet Take 1 tablet (0.5 mg total) by mouth 3 (three) times daily as needed for anxiety. 60 tablet 0  . amLODipine (NORVASC) 10 MG tablet Take 10 mg by mouth daily.    [name] amoxicillin-clavulanate (AUGMENTIN) 875-125 MG tablet Take 1 tablet by mouth 2 (two) times daily. 20 tablet 0  . aspirin EC 81 MG tablet Take 81 mg by mouth daily.    [name] atorvastatin (LIPITOR) 10 MG tablet Take 10 mg by mouth every evening.  4  . glyBURIDE-metformin (GLUCOVANCE) 5-500 MG tablet Take 1 tablet by mouth 2 (two) times daily.  4  . hydrochlorothiazide (HYDRODIURIL) 25 MG tablet Take 25 mg by mouth daily.    [name] ibuprofen (ADVIL,MOTRIN) 800 MG tablet Take 800 mg by mouth every 8 (eight) hours as needed.  0  . KLOR-CON M20 20 MEQ tablet     . lidocaine-prilocaine (EMLA) cream Apply a quarter size amount to affected area 1 hour prior to coming to chemotherapy. 30 g 2  . lisinopril (PRINIVIL,ZESTRIL) 40 MG tablet Take 40 mg by mouth daily.    . ondansetron (ZOFRAN) 8 MG tablet Take 1 tablet (8 mg total) by mouth every 8 (eight) hours as needed for nausea or vomiting. 30 tablet 2  . oxyCODONE (OXY IR/ROXICODONE) 5 MG immediate release tablet Take 1-2 tablets (5-10 mg total) by mouth every 6 (six) hours as needed for moderate pain, severe pain or breakthrough pain. 30 tablet 0  . potassium chloride 20 MEQ/15ML (10%) SOLN Take 15 mLs (20 mEq total) by mouth 2 (two) times daily. 473 mL 0  . prochlorperazine (COMPAZINE) 10 MG tablet Take 1  tablet (10 mg total) by mouth every 6 (six) hours as needed for nausea or vomiting. 30 tablet 2  . temazepam (RESTORIL) 30 MG capsule      No current facility-administered medications for this visit.    Review of Systems  Constitutional: Negative.  Negative for weight loss.       No loss of appetite  HENT: Negative.   Eyes: Negative.   Respiratory: Negative.  Negative for shortness of breath.   Cardiovascular: Negative.  Negative for chest pain.  Gastrointestinal: Negative.  Genitourinary: Negative.   Musculoskeletal: Negative.   Skin: Negative.   Neurological: Negative.  Negative for tingling.  Endo/Heme/Allergies: Negative.   Psychiatric/Behavioral: Negative.   All other systems reviewed and are negative. 14 point ROS was done and is otherwise as detailed above or in HPI  PHYSICAL EXAMINATION: ECOG PERFORMANCE STATUS: 0 - Asymptomatic  There were no vitals filed for this visit. There were no vitals filed for this visit.   Physical Exam  Constitutional: [name] is oriented to person, place, and time and well-developed, well-nourished, and in no distress.  HENT:  Head: Normocephalic and atraumatic.  Mouth/Throat: No oropharyngeal exudate.  Eyes: Conjunctivae and EOM are normal. Pupils are equal, round, and reactive to light. No scleral icterus.  Neck: Normal range of motion. Neck supple.  Cardiovascular: Normal rate, regular rhythm and normal heart sounds.   Pulmonary/Chest: Effort normal and breath sounds normal.  Abdominal: Soft. Bowel sounds are normal. [name] exhibits no distension and no mass. There is no tenderness. There is no rebound and no guarding.  Musculoskeletal: Normal range of motion.  Lymphadenopathy:    [name] has no cervical adenopathy.  Neurological: [name] is alert and oriented to person, place, and time. Gait normal.  Skin: Skin is warm and dry.  Darkening under nails  Psychiatric: Mood, memory, affect and judgment normal.  Nursing note and vitals  reviewed.  LABORATORY DATA:  I have reviewed the data as listed Lab Results  Component Value Date   WBC 3.3 (L) 10/09/2016   HGB 11.1 (L) 10/09/2016   HCT 31.6 (L) 10/09/2016   MCV 89.3 10/09/2016   PLT 253 10/09/2016   CMP     Component Value Date/Time   NA 134 (L) 10/09/2016 1105   K 3.3 (L) 10/09/2016 1105   CL 98 (L) 10/09/2016 1105   CO2 26 10/09/2016 1105   GLUCOSE 306 (H) 10/09/2016 1105   BUN 11 10/09/2016 1105   CREATININE 0.81 10/09/2016 1105   CALCIUM 9.8 10/09/2016 1105   PROT 6.5 10/09/2016 1105   ALBUMIN 3.6 10/09/2016 1105   AST 22 10/09/2016 1105   ALT 30 10/09/2016 1105   ALKPHOS 86 10/09/2016 1105   BILITOT 0.7 10/09/2016 1105   GFRNONAA >60 10/09/2016 1105   GFRAA >60 10/09/2016 1105   RADIOGRAPHIC STUDIES: I have personally reviewed the radiological images as listed and agreed with the findings in the report.  CT Head 09/21/2016 IMPRESSION: 1. Normal CT of the brain. No evidence of intracranial metastatic disease. 2. Frothy secretions throughout most of the paranasal sinuses. Correlate clinically for acute sinusitis. This could serve as a source of headache.  PATHOLOGY:        ASSESSMENT & PLAN:  Clinical T2 N1 M0 Stage 2b  ER 80% positive, PR negative, HER-2 negative, Ki-67 90% of right breast.  Pathology reviewed. Results noted above. We discussed breast cancer in a general sense and I explained ER, PR and HER 2. We discussed the different types of breast cancer. I explained the role of chemotherapy prior to surgery ie. Neoadjuvant, and the role of endocrine therapy and radiation therapy. [name] was provided with several sources of educational material.  [name] is clinical T2 N1 M0 Stage IIb ER 80% positive, PR negative, HER-2 negative, Ki-67 90%.  Currently on neoadjuvant chemotherapy with dose dense AC followed by 12 week of  taxol.   PLAN: Proceed with cycle 5 of taxol today. Patient tolerating chemotherapy well.  I have told the RN to  order a port study if   her chemoport continues to not be able to return blood.   I will write a letter to her employer saying that [name] will not be able to return to work for the next 4 months due to continued treatment with chemotherapy followed by surgery.   [name] will return for follow up in 2 weeks.  This document serves as a record of services personally performed by  , MD. It was created on her behalf by Jordan Casey, a trained medical scribe. The creation of this record is based on the scribe's personal observations and the provider's statements to them. This document has been checked and approved by the attending provider.  I have reviewed the above documentation for accuracy and completeness and I agree with the above.  This note was electronically signed.   Jordan M Casey  10/16/2016 9:52 AM     

## 2016-10-16 NOTE — Patient Instructions (Addendum)
[facility] Discharge Instructions  RECOMMENDATIONS MADE BY THE CONSULTANT AND ANY TEST RESULTS WILL BE SENT TO YOUR REFERRING PHYSICIAN.  You were seen today by Dr. Talbert Cage.  Return to clinic next week for chemo and in 2 weeks to see doctor and for chemo   Thank you for choosing [facility] at [facility] to provide your oncology and hematology care.  To afford each patient quality time with our provider, please arrive at least 15 minutes before your scheduled appointment time.    If you have a lab appointment with the [facility] please come in thru the  Main Entrance and check in at the main information desk  You need to re-schedule your appointment should you arrive 10 or more minutes late.  We strive to give you quality time with our providers, and arriving late affects you and other patients whose appointments are after yours.  Also, if you no show three or more times for appointments you may be dismissed from the clinic at the providers discretion.     Again, thank you for choosing [facility].  Our hope is that these requests will decrease the amount of time that you wait before being seen by our physicians.       _____________________________________________________________  Should you have questions after your visit to [facility], please contact our office at [phone] between the hours of 8:30 a.m. and 4:30 p.m.  Voicemails left after 4:30 p.m. will not be returned until the following business day.  For prescription refill requests, have your pharmacy contact our office.       Resources For Cancer Patients and their Caregivers ? American Cancer Society: Can assist with transportation, wigs, general needs, runs Look Good Feel Better.        ([phone] ? Cancer Care: Provides financial assistance, online support groups, medication/co-pay assistance.  1-800-813-HOPE (817)745-8559) ? [place] Assists [place] cancer patients and their families through emotional , educational and financial support.  2561142641 ? [place] DSS Where to apply for food stamps, Medicaid and utility assistance. 346 694 6903 ? [employer]: Transportation to medical appointments. 902-316-1899 ? Social Security Administration: May apply for disability if have a Stage IV cancer. (906)334-5476 (253) 005-3775 ? [employer], Disability and Transit Services: Assists with nutrition, care and transit needs. [facility] Support Programs: @10RELATIVEDAYS @ > Cancer Support Group  2nd Tuesday of the month 1pm-2pm, Journey Room  > Creative Journey  3rd Tuesday of the month 1130am-1pm, Journey Room  > Look Good Feel Better  1st Wednesday of the month 10am-12 noon, Journey Room (Call [facility] to register [phone])

## 2016-10-16 NOTE — Patient Instructions (Signed)
[facility] Discharge Instructions for Patients Receiving Chemotherapy   Beginning January 23rd 2017 lab work for the [facility] will be done in the  Main lab at [facility] on 1st floor. If you have a lab appointment with the [facility] please come in thru the  Main Entrance and check in at the main information desk   Today you received the following chemotherapy agent: Taxol.     If you develop nausea and vomiting, or diarrhea that is not controlled by your medication, call the clinic.  The clinic phone number is [phone]. Office hours are Monday-Friday 8:30am-5:00pm.  BELOW ARE SYMPTOMS THAT SHOULD BE REPORTED IMMEDIATELY:  *FEVER GREATER THAN 101.0 F  *CHILLS WITH OR WITHOUT FEVER  NAUSEA AND VOMITING THAT IS NOT CONTROLLED WITH YOUR NAUSEA MEDICATION  *UNUSUAL SHORTNESS OF BREATH  *UNUSUAL BRUISING OR BLEEDING  TENDERNESS IN MOUTH AND THROAT WITH OR WITHOUT PRESENCE OF ULCERS  *URINARY PROBLEMS  *BOWEL PROBLEMS  UNUSUAL RASH Items with * indicate a potential emergency and should be followed up as soon as possible. If you have an emergency after office hours please contact your primary care physician or go to the nearest emergency department.  Please call the clinic during office hours if you have any questions or concerns.   You may also contact the Patient Navigator at [phone] should you have any questions or need assistance in obtaining follow up care.      Resources For Cancer Patients and their Caregivers ? American Cancer Society: Can assist with transportation, wigs, general needs, runs Look Good Feel Better.        380-660-3343 ? Cancer Care: Provides financial assistance, online support groups, medication/co-pay assistance.  1-800-813-HOPE (504) 447-9555) ? [facility] Assists [place] cancer patients and their families through emotional , educational and financial support.   763-861-2931 ? [place] DSS Where to apply for food stamps, Medicaid and utility assistance. [phone] ? [employer]: Transportation to medical appointments. (339)123-9815 ? Social Security Administration: May apply for disability if have a Stage IV cancer. 508-445-0733 [phone] ? [employer], Disability and Transit Services: Assists with nutrition, care and transit needs. [phone]

## 2016-10-16 NOTE — Progress Notes (Signed)
Labs printed and discussed with Dr. Talbert Cage, advised to treat with no further instruction.  Hyperglycemia highlighted and no additional instruction.  Patient educated on choosing foods that are low sugar/low carbohydrate during visit.  Patient tolerated infusion well.  VSS and patient ambulatory and stable upon discharge from clinic.

## 2016-10-22 ENCOUNTER — Other Ambulatory Visit (HOSPITAL_COMMUNITY): Payer: Self-pay | Admitting: Oncology

## 2016-10-23 ENCOUNTER — Encounter (HOSPITAL_COMMUNITY): Payer: Self-pay

## 2016-10-23 ENCOUNTER — Encounter (HOSPITAL_BASED_OUTPATIENT_CLINIC_OR_DEPARTMENT_OTHER): Payer: No Typology Code available for payment source

## 2016-10-23 VITALS — BP 134/87 | HR 98 | Temp 97.9°F | Wt 182.8 lb

## 2016-10-23 DIAGNOSIS — Z5111 Encounter for antineoplastic chemotherapy: Secondary | ICD-10-CM

## 2016-10-23 DIAGNOSIS — C50411 Malignant neoplasm of upper-outer quadrant of right female breast: Secondary | ICD-10-CM

## 2016-10-23 DIAGNOSIS — Z170 Estrogen receptor positive status [ER+]: Principal | ICD-10-CM

## 2016-10-23 DIAGNOSIS — Z01818 Encounter for other preprocedural examination: Secondary | ICD-10-CM | POA: Diagnosis not present

## 2016-10-23 LAB — CBC WITH DIFFERENTIAL/PLATELET
Basophils Absolute: 0 10*3/uL (ref 0.0–0.1)
Basophils Relative: 0 %
EOS ABS: 0 10*3/uL (ref 0.0–0.7)
EOS PCT: 1 %
HCT: 32 % — ABNORMAL LOW (ref 36.0–46.0)
Hemoglobin: 11.4 g/dL — ABNORMAL LOW (ref 12.0–15.0)
LYMPHS PCT: 20 %
Lymphs Abs: 0.7 10*3/uL (ref 0.7–4.0)
MCH: 32.2 pg (ref 26.0–34.0)
MCHC: 35.6 g/dL (ref 30.0–36.0)
MCV: 90.4 fL (ref 78.0–100.0)
MONO ABS: 0.3 10*3/uL (ref 0.1–1.0)
MONOS PCT: 7 %
Neutro Abs: 2.6 10*3/uL (ref 1.7–7.7)
Neutrophils Relative %: 72 %
PLATELETS: 249 10*3/uL (ref 150–400)
RBC: 3.54 MIL/uL — AB (ref 3.87–5.11)
RDW: 16.6 % — AB (ref 11.5–15.5)
WBC: 3.6 10*3/uL — ABNORMAL LOW (ref 4.0–10.5)

## 2016-10-23 LAB — COMPREHENSIVE METABOLIC PANEL
ALT: 35 U/L (ref 14–54)
AST: 26 U/L (ref 15–41)
Albumin: 3.7 g/dL (ref 3.5–5.0)
Alkaline Phosphatase: 76 U/L (ref 38–126)
Anion gap: 10 (ref 5–15)
BUN: 10 mg/dL (ref 6–20)
CALCIUM: 9.7 mg/dL (ref 8.9–10.3)
CHLORIDE: 101 mmol/L (ref 101–111)
CO2: 23 mmol/L (ref 22–32)
CREATININE: 0.83 mg/dL (ref 0.44–1.00)
Glucose, Bld: 302 mg/dL — ABNORMAL HIGH (ref 65–99)
Potassium: 3.4 mmol/L — ABNORMAL LOW (ref 3.5–5.1)
Sodium: 134 mmol/L — ABNORMAL LOW (ref 135–145)
Total Bilirubin: 0.6 mg/dL (ref 0.3–1.2)
Total Protein: 6.5 g/dL (ref 6.5–8.1)

## 2016-10-23 MED ORDER — SODIUM CHLORIDE 0.9 % IV SOLN
Freq: Once | INTRAVENOUS | Status: AC
  Administered 2016-10-23: 11:00:00 via INTRAVENOUS

## 2016-10-23 MED ORDER — FAMOTIDINE IN NACL 20-0.9 MG/50ML-% IV SOLN
20.0000 mg | Freq: Once | INTRAVENOUS | Status: AC
  Administered 2016-10-23: 20 mg via INTRAVENOUS
  Filled 2016-10-23: qty 50

## 2016-10-23 MED ORDER — SODIUM CHLORIDE 0.9% FLUSH
10.0000 mL | INTRAVENOUS | Status: DC | PRN
  Administered 2016-10-23: 10 mL
  Filled 2016-10-23: qty 10

## 2016-10-23 MED ORDER — DIPHENHYDRAMINE HCL 50 MG/ML IJ SOLN
50.0000 mg | Freq: Once | INTRAMUSCULAR | Status: AC
  Administered 2016-10-23: 50 mg via INTRAVENOUS
  Filled 2016-10-23: qty 1

## 2016-10-23 MED ORDER — SODIUM CHLORIDE 0.9 % IV SOLN
20.0000 mg | Freq: Once | INTRAVENOUS | Status: AC
  Administered 2016-10-23: 20 mg via INTRAVENOUS
  Filled 2016-10-23: qty 2

## 2016-10-23 MED ORDER — DEXTROSE 5 % IV SOLN
80.0000 mg/m2 | Freq: Once | INTRAVENOUS | Status: AC
  Administered 2016-10-23: 156 mg via INTRAVENOUS
  Filled 2016-10-23: qty 26

## 2016-10-23 MED ORDER — HEPARIN SOD (PORK) LOCK FLUSH 100 UNIT/ML IV SOLN
500.0000 [IU] | Freq: Once | INTRAVENOUS | Status: AC | PRN
  Administered 2016-10-23: 500 [IU]
  Filled 2016-10-23: qty 5

## 2016-10-23 NOTE — Progress Notes (Signed)
[name] tolerated chemo tx well without complaints or incident. Labs reviewed prior to administering chemotherapy. VSS upon discharge. Pt discharged self ambulatory in satisfactory condition accompanied by her son

## 2016-10-23 NOTE — Patient Instructions (Signed)
[facility] Discharge Instructions for Patients Receiving Chemotherapy   Beginning January 23rd 2017 lab work for the Cancer Center will be done in the  Main lab at [facility] on 1st floor. If you have a lab appointment with the Cancer Center please come in thru the  Main Entrance and check in at the main information desk   Today you received the following chemotherapy agents Taxol. Follow-up as scheduled. Call clinic for any questions or concerns  To help prevent nausea and vomiting after your treatment, we encourage you to take your nausea medication   If you develop nausea and vomiting, or diarrhea that is not controlled by your medication, call the clinic.  The clinic phone number is [phone]. Office hours are Monday-Friday 8:30am-5:00pm.  BELOW ARE SYMPTOMS THAT SHOULD BE REPORTED IMMEDIATELY:  *FEVER GREATER THAN 101.0 F  *CHILLS WITH OR WITHOUT FEVER  NAUSEA AND VOMITING THAT IS NOT CONTROLLED WITH YOUR NAUSEA MEDICATION  *UNUSUAL SHORTNESS OF BREATH  *UNUSUAL BRUISING OR BLEEDING  TENDERNESS IN MOUTH AND THROAT WITH OR WITHOUT PRESENCE OF ULCERS  *URINARY PROBLEMS  *BOWEL PROBLEMS  UNUSUAL RASH Items with * indicate a potential emergency and should be followed up as soon as possible. If you have an emergency after office hours please contact your primary care physician or go to the nearest emergency department.  Please call the clinic during office hours if you have any questions or concerns.   You may also contact the Patient Navigator at [phone] should you have any questions or need assistance in obtaining follow up care.      Resources For Cancer Patients and their Caregivers ? American Cancer Society: Can assist with transportation, wigs, general needs, runs Look Good Feel Better.        1-888-227-6333 ? Cancer Care: Provides financial assistance, online support groups, medication/co-pay assistance.  1-800-813-HOPE  (4673) ? [facility] Assists [place] cancer patients and their families through emotional , educational and financial support.  336-427-4357 ? [place] DSS Where to apply for food stamps, Medicaid and utility assistance. 336-342-1394 ? [employer]: Transportation to medical appointments. 336-347-2287 ? Social Security Administration: May apply for disability if have a Stage IV cancer. 336-342-7796 1-800-772-1213 ? [place] [facility], Disability and Transit Services: Assists with nutrition, care and transit needs. 336-349-2343         

## 2016-10-30 ENCOUNTER — Encounter (HOSPITAL_BASED_OUTPATIENT_CLINIC_OR_DEPARTMENT_OTHER): Payer: No Typology Code available for payment source | Admitting: Oncology

## 2016-10-30 ENCOUNTER — Encounter (HOSPITAL_BASED_OUTPATIENT_CLINIC_OR_DEPARTMENT_OTHER): Payer: No Typology Code available for payment source

## 2016-10-30 ENCOUNTER — Encounter (HOSPITAL_COMMUNITY): Payer: Self-pay

## 2016-10-30 VITALS — BP 122/72 | HR 100 | Temp 98.2°F | Resp 16 | Wt 180.0 lb

## 2016-10-30 VITALS — BP 123/75 | HR 103 | Temp 98.4°F | Resp 18

## 2016-10-30 DIAGNOSIS — Z5189 Encounter for other specified aftercare: Secondary | ICD-10-CM

## 2016-10-30 DIAGNOSIS — Z170 Estrogen receptor positive status [ER+]: Principal | ICD-10-CM

## 2016-10-30 DIAGNOSIS — C50411 Malignant neoplasm of upper-outer quadrant of right female breast: Secondary | ICD-10-CM | POA: Diagnosis not present

## 2016-10-30 DIAGNOSIS — R110 Nausea: Secondary | ICD-10-CM

## 2016-10-30 DIAGNOSIS — Z01818 Encounter for other preprocedural examination: Secondary | ICD-10-CM | POA: Diagnosis not present

## 2016-10-30 DIAGNOSIS — Z5111 Encounter for antineoplastic chemotherapy: Secondary | ICD-10-CM

## 2016-10-30 DIAGNOSIS — Z452 Encounter for adjustment and management of vascular access device: Secondary | ICD-10-CM

## 2016-10-30 LAB — COMPREHENSIVE METABOLIC PANEL WITH GFR
ALT: 39 U/L (ref 14–54)
AST: 33 U/L (ref 15–41)
Albumin: 3.7 g/dL (ref 3.5–5.0)
Alkaline Phosphatase: 76 U/L (ref 38–126)
Anion gap: 10 (ref 5–15)
BUN: 11 mg/dL (ref 6–20)
CO2: 23 mmol/L (ref 22–32)
Calcium: 9.5 mg/dL (ref 8.9–10.3)
Chloride: 101 mmol/L (ref 101–111)
Creatinine, Ser: 0.86 mg/dL (ref 0.44–1.00)
GFR calc Af Amer: >60 mL/min
GFR calc non Af Amer: >60 mL/min
Glucose, Bld: 314 mg/dL — ABNORMAL HIGH (ref 65–99)
Potassium: 3.4 mmol/L — ABNORMAL LOW (ref 3.5–5.1)
Sodium: 134 mmol/L — ABNORMAL LOW (ref 135–145)
Total Bilirubin: 0.6 mg/dL (ref 0.3–1.2)
Total Protein: 6.7 g/dL (ref 6.5–8.1)

## 2016-10-30 LAB — CBC WITH DIFFERENTIAL/PLATELET
Basophils Absolute: 0 10*3/uL (ref 0.0–0.1)
Basophils Relative: 0 %
Eosinophils Absolute: 0 10*3/uL (ref 0.0–0.7)
Eosinophils Relative: 1 %
HCT: 32.9 % — ABNORMAL LOW (ref 36.0–46.0)
Hemoglobin: 11.7 g/dL — ABNORMAL LOW (ref 12.0–15.0)
Lymphocytes Relative: 25 %
Lymphs Abs: 0.9 10*3/uL (ref 0.7–4.0)
MCH: 32.1 pg (ref 26.0–34.0)
MCHC: 35.6 g/dL (ref 30.0–36.0)
MCV: 90.4 fL (ref 78.0–100.0)
Monocytes Absolute: 0.2 10*3/uL (ref 0.1–1.0)
Monocytes Relative: 4 %
Neutro Abs: 2.5 10*3/uL (ref 1.7–7.7)
Neutrophils Relative %: 70 %
Platelets: 247 10*3/uL (ref 150–400)
RBC: 3.64 MIL/uL — ABNORMAL LOW (ref 3.87–5.11)
RDW: 15.9 % — ABNORMAL HIGH (ref 11.5–15.5)
WBC: 3.6 10*3/uL — ABNORMAL LOW (ref 4.0–10.5)

## 2016-10-30 MED ORDER — SODIUM CHLORIDE 0.9 % IV SOLN: 20.0000 mg | Freq: Once | INTRAVENOUS | Status: DC

## 2016-10-30 MED ORDER — DEXTROSE 5 % IV SOLN
80.0000 mg/m2 | Freq: Once | INTRAVENOUS | Status: AC
  Administered 2016-10-30: 156 mg via INTRAVENOUS
  Filled 2016-10-30: qty 26

## 2016-10-30 MED ORDER — DEXAMETHASONE SODIUM PHOSPHATE 10 MG/ML IJ SOLN
10.0000 mg | Freq: Once | INTRAMUSCULAR | Status: AC
  Administered 2016-10-30: 10 mg via INTRAVENOUS
  Filled 2016-10-30: qty 1

## 2016-10-30 MED ORDER — DIPHENHYDRAMINE HCL 50 MG/ML IJ SOLN
50.0000 mg | Freq: Once | INTRAMUSCULAR | Status: AC
  Administered 2016-10-30: 50 mg via INTRAVENOUS
  Filled 2016-10-30: qty 1

## 2016-10-30 MED ORDER — ALTEPLASE 2 MG IJ SOLR
2.0000 mg | Freq: Once | INTRAMUSCULAR | Status: AC
  Administered 2016-10-30: 2 mg

## 2016-10-30 MED ORDER — HEPARIN SOD (PORK) LOCK FLUSH 100 UNIT/ML IV SOLN
500.0000 [IU] | Freq: Once | INTRAVENOUS | Status: AC | PRN
  Administered 2016-10-30: 500 [IU]
  Filled 2016-10-30 (×2): qty 5

## 2016-10-30 MED ORDER — ALTEPLASE 2 MG IJ SOLR
INTRAMUSCULAR | Status: AC
  Filled 2016-10-30: qty 2

## 2016-10-30 MED ORDER — DEXAMETHASONE SODIUM PHOSPHATE 10 MG/ML IJ SOLN
10.0000 mg | INTRAMUSCULAR | Status: AC
  Administered 2016-10-30: 10 mg via INTRAVENOUS

## 2016-10-30 MED ORDER — STERILE WATER FOR INJECTION IJ SOLN
INTRAMUSCULAR | Status: AC
  Filled 2016-10-30: qty 10

## 2016-10-30 MED ORDER — DEXAMETHASONE SODIUM PHOSPHATE 10 MG/ML IJ SOLN
INTRAMUSCULAR | Status: AC
  Filled 2016-10-30: qty 1

## 2016-10-30 MED ORDER — FAMOTIDINE IN NACL 20-0.9 MG/50ML-% IV SOLN
20.0000 mg | Freq: Once | INTRAVENOUS | Status: AC
  Administered 2016-10-30: 20 mg via INTRAVENOUS
  Filled 2016-10-30: qty 50

## 2016-10-30 MED ORDER — SODIUM CHLORIDE 0.9 % IV SOLN
Freq: Once | INTRAVENOUS | Status: AC
Start: 2016-10-30 — End: 2016-10-30
  Administered 2016-10-30: 14:00:00 via INTRAVENOUS

## 2016-10-30 NOTE — Progress Notes (Signed)
[facility]  PROGRESS NOTE  Patient Care Team: Monico Blitz, MD as PCP - General (Internal Medicine)  CHIEF COMPLAINTS/PURPOSE OF CONSULTATION:   clinical T2 N1 M0 Stage 2b, ER 80% positive, PR negative, HER-2 negative, Ki-67 90% of right breast.   Breast cancer of upper-outer quadrant of right female breast ([place])   06/24/2016 Mammogram    Diagnostic bilateral mammogram: highly suspicious 3.5 cm mass in the UO R breast with marked enlarged R axillary LN      06/24/2016 Breast US    2.7 x 3.1 x 3.5 cm slightly irregular hypoechoic mass at the 10:00 position of right breast, 6 cm from the nipple.  4.1 x 6 cm right axillar lymph node also identified.      07/01/2016 Pathology Results    Infiltrating ductal carcinoma ER+ 80%, PR-, HER 2-, Ki67 90%      07/01/2016 Initial Biopsy    Ultrasound guided R breast biopsy and R axillary LN biopsy both were clipped post biopsy      07/20/2016 Imaging    MUGA- Upper normal left ventricular ejection fraction of 75% with normal LV wall motion.      07/21/2016 Imaging    CT CAP- 2.9 cm right upper outer quadrant breast mass, consistent with known primary breast carcinoma.  Right axillary and subpectoral lymphadenopathy, consistent with metastatic disease.  Tiny sub-cm bilateral pulmonary nodules are indeterminate, but favor postinflammatory etiology over early metastases. Recommend continued attention on follow-up CT.  No evidence of metastatic disease within the abdomen or pelvis.      07/23/2016 Procedure    Port placed by Dr. Barry Dienes      07/24/2016 -  Neo-Adjuvant Chemotherapy    Adriamycin/Cytoxan x 4, followed by weekly Taxol x 12  (current therapy)       07/30/2016 Imaging    Bone scan: Negative for osseous disease .      09/22/2016 Imaging    CT head- 1. Normal CT of the brain. No evidence of intracranial metastatic disease. 2. Frothy secretions throughout most of the paranasal sinuses. Correlate  clinically for acute sinusitis. This could serve as a source of headache.      HISTORY OF PRESENTING ILLNESS:  [name] 61 y.o. female is here for follow up of right breast cancer. Started neo-adjuvant chemotherapy Adriamycin/Cytoxan with weekly Taxol on 07/24/2016.   She is doing well today. She has some numbness and tingling in her hands, but she can still button her clothes. She vomited 2-3 times on Wednesday. She had some cereal that morning and believes it could have been from the milk. It didn't happen after that. She has some nausea, but doesn't want to take nausea medication. She reports sinus congestion from allergies. Denies fatigue, fever, chest pain, SOB, abdominal pain, loss of appetite, or any other concerns.   MEDICAL HISTORY:  Past Medical History:  Diagnosis Date  . Arthritis   . Breast cancer of upper-outer quadrant of right female breast ([place]) 07/14/2016  . Cancer ([place])   . Cough    dry  . Diabetes mellitus without complication ([place])   . Hypertension   . Ruptured eardrum    left     SURGICAL HISTORY: Past Surgical History:  Procedure Laterality Date  . ABDOMINAL HYSTERECTOMY     with bilateral bso  . COLONOSCOPY    . PORTACATH PLACEMENT Left 07/23/2016   Procedure: INSERTION PORT-A-CATH;  Surgeon: Stark Klein, MD;  Location: [facility];  Service:  General;  Laterality: Left;    SOCIAL HISTORY: Social History   Social History  . Marital status: Single    Spouse name: N/A  . Number of children: N/A  . Years of education: N/A   Occupational History  . Not on file.   Social History Main Topics  . Smoking status: Former Smoker    Types: Cigarettes  . Smokeless tobacco: Never Used  . Alcohol use No  . Drug use: No  . Sexual activity: Not on file   Other Topics Concern  . Not on file   Social History Narrative  . No narrative on file   She is single She doesn't smoke, no alcohol use.  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 Loves flowers and doing yard work. She loves games on her tablet.   FAMILY HISTORY: History reviewed. No pertinent family history. 2 sisters 5 brothers No  family history of breast cancer or prostate cancer  Sister has history of colon cancer- she had 2 hernias removed Mom- 56 deceased, was on dialysis and heart got weaker and weaker.  Dad- 44 deceased, - was a heavy drinker   Has 2 boys, 65 and 29 years of age 21 grandson- 9 months old   ALLERGIES:  has No Known Allergies.  MEDICATIONS:  Current Outpatient Prescriptions  Medication Sig Dispense Refill  . ALPRAZolam (XANAX) 0.5 MG tablet Take 1 tablet (0.5 mg total) by mouth 3 (three) times daily as needed for anxiety. 60 tablet 0  . amLODipine (NORVASC) 10 MG tablet Take 10 mg by mouth daily.    [name] amoxicillin-clavulanate (AUGMENTIN) 875-125 MG tablet Take 1 tablet by mouth 2 (two) times daily. 20 tablet 0  . aspirin EC 81 MG tablet Take 81 mg by mouth daily.    [name] atorvastatin (LIPITOR) 10 MG tablet Take 10 mg by mouth every evening.  4  . glyBURIDE-metformin (GLUCOVANCE) 5-500 MG tablet Take 1 tablet by mouth 2 (two) times daily.  4  . hydrochlorothiazide (HYDRODIURIL) 25 MG tablet Take 25 mg by mouth daily.    [name] ibuprofen (ADVIL,MOTRIN) 800 MG tablet Take 800 mg by mouth every 8 (eight) hours as needed.  0  . KLOR-CON M20 20 MEQ tablet     . lidocaine-prilocaine (EMLA) cream Apply a quarter size amount to affected area 1 hour prior to coming to chemotherapy. 30 g 2  . lisinopril (PRINIVIL,ZESTRIL) 40 MG tablet Take 40 mg by mouth daily.    . ondansetron (ZOFRAN) 8 MG tablet Take 1 tablet (8 mg total) by mouth every 8 (eight) hours as needed for nausea or vomiting. 30 tablet 2  . oxyCODONE (OXY IR/ROXICODONE) 5 MG immediate release tablet Take 1-2 tablets (5-10 mg total) by mouth every 6 (six) hours as needed for moderate pain, severe pain or breakthrough  pain. 30 tablet 0  . potassium chloride 20 MEQ/15ML (10%) SOLN Take 15 mLs (20 mEq total) by mouth 2 (two) times daily. 473 mL 0  . prochlorperazine (COMPAZINE) 10 MG tablet Take 1 tablet (10 mg total) by mouth every 6 (six) hours as needed for nausea or vomiting. 30 tablet 2  . temazepam (RESTORIL) 30 MG capsule      No current facility-administered medications for this visit.    Review of Systems  Constitutional: Negative.  Negative for fever and malaise/fatigue.       No loss of appetite  HENT:  Positive for congestion (allergies).   Eyes: Negative.   Respiratory: Negative.  Negative for shortness of breath.   Cardiovascular: Negative.  Negative for chest pain.  Gastrointestinal: Positive for nausea and vomiting (3 episodes, 1 day). Negative for abdominal pain.  Genitourinary: Negative.   Musculoskeletal: Negative.   Skin: Negative.   Neurological: Positive for tingling (hands).  Endo/Heme/Allergies: Negative.   Psychiatric/Behavioral: Negative.   All other systems reviewed and are negative. 14 point ROS was done and is otherwise as detailed above or in HPI  PHYSICAL EXAMINATION: ECOG PERFORMANCE STATUS: 0 - Asymptomatic  Vitals:   10/30/16 1155  BP: 122/72  Pulse: 100  Resp: 16  Temp: 98.2 F (36.8 C)   Filed Weights   10/30/16 1155  Weight: 180 lb (81.6 kg)    Physical Exam  Constitutional: She is oriented to person, place, and time and well-developed, well-nourished, and in no distress.  HENT:  Head: Normocephalic and atraumatic.  Mouth/Throat: No oropharyngeal exudate.  Eyes: Conjunctivae and EOM are normal. Pupils are equal, round, and reactive to light. No scleral icterus.  Neck: Normal range of motion. Neck supple.  Cardiovascular: Normal rate, regular rhythm and normal heart sounds.   Pulmonary/Chest: Effort normal and breath sounds normal.  Abdominal: Soft. Bowel sounds are normal. She exhibits no distension and no mass. There is no tenderness. There is no  rebound and no guarding.  Musculoskeletal: Normal range of motion.  Lymphadenopathy:    She has no cervical adenopathy.  Neurological: She is alert and oriented to person, place, and time. Gait normal.  Skin: Skin is warm and dry.  Darkening under nails  Psychiatric: Mood, memory, affect and judgment normal.  Nursing note and vitals reviewed.  LABORATORY DATA:  I have reviewed the data as listed Lab Results  Component Value Date   WBC 3.6 (L) 10/23/2016   HGB 11.4 (L) 10/23/2016   HCT 32.0 (L) 10/23/2016   MCV 90.4 10/23/2016   PLT 249 10/23/2016   CMP     Component Value Date/Time   NA 134 (L) 10/23/2016 0936   K 3.4 (L) 10/23/2016 0936   CL 101 10/23/2016 0936   CO2 23 10/23/2016 0936   GLUCOSE 302 (H) 10/23/2016 0936   BUN 10 10/23/2016 0936   CREATININE 0.83 10/23/2016 0936   CALCIUM 9.7 10/23/2016 0936   PROT 6.5 10/23/2016 0936   ALBUMIN 3.7 10/23/2016 0936   AST 26 10/23/2016 0936   ALT 35 10/23/2016 0936   ALKPHOS 76 10/23/2016 0936   BILITOT 0.6 10/23/2016 0936   GFRNONAA >60 10/23/2016 0936   GFRAA >60 10/23/2016 0936   RADIOGRAPHIC STUDIES: I have personally reviewed the radiological images as listed and agreed with the findings in the report.  CT Head 09/21/2016 IMPRESSION: 1. Normal CT of the brain. No evidence of intracranial metastatic disease. 2. Frothy secretions throughout most of the paranasal sinuses. Correlate clinically for acute sinusitis. This could serve as a source of headache.  PATHOLOGY:        ASSESSMENT & PLAN:  Clinical T2 N1 M0 Stage 2b  ER 80% positive, PR negative, HER-2 negative, Ki-67 90% of right breast.  Pathology reviewed. Results noted above. We discussed breast cancer in a general sense and I explained ER, PR and HER 2. We discussed the different types of breast cancer. I explained the role of chemotherapy prior to surgery ie. Neoadjuvant, and the role of endocrine therapy and radiation therapy. She was provided  with several sources of educational material.  She is clinical T2 N1 M0 Stage IIb ER 80% positive, PR negative, HER-2 negative, Ki-67 90%.  Currently on neoadjuvant chemotherapy with dose dense AC followed by 12 week of  taxol.   PLAN: Continue with cycle 7 of taxol today. Continue as planned for 12 weeks of taxol. 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  I encouraged her to take her nausea medication and to avoid greasy/spicy foods and dairy products the first few days after treatment.   She will return for follow up in 2 weeks.   This document serves as a record of services personally performed by Twana First, MD. It was created on her behalf by Martinique Casey, a trained medical scribe. The creation of this record is based on the scribe's personal observations and the provider's statements to them. This document has been checked and approved by the attending provider.  I have reviewed the above documentation for accuracy and completeness and I agree with the above.  This note was electronically signed.   Martinique M Casey  10/30/2016 12:07 PM

## 2016-10-30 NOTE — Patient Instructions (Addendum)
[facility] at [facility] Discharge Instructions  RECOMMENDATIONS MADE BY THE CONSULTANT AND ANY TEST RESULTS WILL BE SENT TO YOUR REFERRING PHYSICIAN.  You were seen today by Dr. Louise Zhou Follow up in 2 weeks    Thank you for choosing [facility] at [facility] to provide your oncology and hematology care.  To afford each patient quality time with our provider, please arrive at least 15 minutes before your scheduled appointment time.    If you have a lab appointment with the Cancer Center please come in thru the  Main Entrance and check in at the main information desk  You need to re-schedule your appointment should you arrive 10 or more minutes late.  We strive to give you quality time with our providers, and arriving late affects you and other patients whose appointments are after yours.  Also, if you no show three or more times for appointments you may be dismissed from the clinic at the providers discretion.     Again, thank you for choosing [facility].  Our hope is that these requests will decrease the amount of time that you wait before being seen by our physicians.       _____________________________________________________________  Should you have questions after your visit to [facility], please contact our office at [phone] between the hours of 8:30 a.m. and 4:30 p.m.  Voicemails left after 4:30 p.m. will not be returned until the following business day.  For prescription refill requests, have your pharmacy contact our office.       Resources For Cancer Patients and their Caregivers ? American Cancer Society: Can assist with transportation, wigs, general needs, runs Look Good Feel Better.        1-888-227-6333 ? Cancer Care: Provides financial assistance, online support groups, medication/co-pay assistance.  1-800-813-HOPE (4673) ? [facility] Assists [place]  cancer patients and their families through emotional , educational and financial support.  336-427-4357 ? [place] DSS Where to apply for food stamps, Medicaid and utility assistance. 336-342-1394 ? [facility]: Transportation to medical appointments. 336-347-2287 ? Social Security Administration: May apply for disability if have a Stage IV cancer. 336-342-7796 1-800-772-1213 ? [place] Aging, Disability and Transit Services: Assists with nutrition, care and transit needs. 336-349-2343  Cancer Center Support Programs: [email or web address]@ > Cancer Support Group  2nd Tuesday of the month 1pm-2pm, Journey Room  > Creative Journey  3rd Tuesday of the month 1130am-1pm, Journey Room  > Look Good Feel Better  1st Wednesday of the month 10am-12 noon, Journey Room (Call American Cancer Society to register 1-800-395-5775)    

## 2016-10-30 NOTE — Progress Notes (Signed)
Tolerated chemo well. Stable and ambulatory on discharge home with sosn.

## 2016-10-30 NOTE — Progress Notes (Signed)
No blood return when port accessed today. Drew blood from her arm today.  1255 Instilled alteplase 2 ml per protocol.  1335 Able to withdraw 2 ml alteplase without any blood return. Port flushes easily without any pain/discomfort.  Patient requested that she get half a dose of benadry today since it makes her feel so bad. Will try this and see how she feels.Chemotherapy given today per orders. Patient tolerated it well without problems. Vitals stable and discharged home from clinic ambulatory.follow up as scheduled.

## 2016-10-30 NOTE — Patient Instructions (Signed)
[facility] Discharge Instructions for Patients Receiving Chemotherapy   Beginning January 23rd 2017 lab work for the Cancer Center will be done in the  Main lab at [facility] on 1st floor. If you have a lab appointment with the Cancer Center please come in thru the  Main Entrance and check in at the main information desk   Today you received the following chemotherapy agents   To help prevent nausea and vomiting after your treatment, we encourage you to take your nausea medication     If you develop nausea and vomiting, or diarrhea that is not controlled by your medication, call the clinic.  The clinic phone number is [phone]. Office hours are Monday-Friday 8:30am-5:00pm.  BELOW ARE SYMPTOMS THAT SHOULD BE REPORTED IMMEDIATELY:  *FEVER GREATER THAN 101.0 F  *CHILLS WITH OR WITHOUT FEVER  NAUSEA AND VOMITING THAT IS NOT CONTROLLED WITH YOUR NAUSEA MEDICATION  *UNUSUAL SHORTNESS OF BREATH  *UNUSUAL BRUISING OR BLEEDING  TENDERNESS IN MOUTH AND THROAT WITH OR WITHOUT PRESENCE OF ULCERS  *URINARY PROBLEMS  *BOWEL PROBLEMS  UNUSUAL RASH Items with * indicate a potential emergency and should be followed up as soon as possible. If you have an emergency after office hours please contact your primary care physician or go to the nearest emergency department.  Please call the clinic during office hours if you have any questions or concerns.   You may also contact the Patient Navigator at [phone] should you have any questions or need assistance in obtaining follow up care.      Resources For Cancer Patients and their Caregivers ? American Cancer Society: Can assist with transportation, wigs, general needs, runs Look Good Feel Better.        1-888-227-6333 ? Cancer Care: Provides financial assistance, online support groups, medication/co-pay assistance.  1-800-813-HOPE (4673) ? [facility] Assists [place] cancer  patients and their families through emotional , educational and financial support.  336-427-4357 ? [place] DSS Where to apply for food stamps, Medicaid and utility assistance. 336-342-1394 ? [facility]: Transportation to medical appointments. 336-347-2287 ? Social Security Administration: May apply for disability if have a Stage IV cancer. 336-342-7796 1-800-772-1213 ? [place] Aging, Disability and Transit Services: Assists with nutrition, care and transit needs. 336-349-2343         

## 2016-11-05 ENCOUNTER — Other Ambulatory Visit (HOSPITAL_COMMUNITY): Payer: Self-pay | Admitting: Oncology

## 2016-11-06 ENCOUNTER — Encounter (HOSPITAL_COMMUNITY): Payer: Self-pay

## 2016-11-06 ENCOUNTER — Encounter (HOSPITAL_COMMUNITY): Payer: No Typology Code available for payment source | Attending: Hematology & Oncology

## 2016-11-06 ENCOUNTER — Ambulatory Visit (HOSPITAL_COMMUNITY): Payer: No Typology Code available for payment source

## 2016-11-06 VITALS — BP 125/73 | HR 94 | Temp 98.4°F | Resp 18 | Wt 181.4 lb

## 2016-11-06 DIAGNOSIS — C50411 Malignant neoplasm of upper-outer quadrant of right female breast: Secondary | ICD-10-CM | POA: Insufficient documentation

## 2016-11-06 DIAGNOSIS — Z01818 Encounter for other preprocedural examination: Secondary | ICD-10-CM | POA: Insufficient documentation

## 2016-11-06 DIAGNOSIS — E876 Hypokalemia: Secondary | ICD-10-CM

## 2016-11-06 DIAGNOSIS — Z5111 Encounter for antineoplastic chemotherapy: Secondary | ICD-10-CM | POA: Diagnosis not present

## 2016-11-06 DIAGNOSIS — Z170 Estrogen receptor positive status [ER+]: Secondary | ICD-10-CM | POA: Insufficient documentation

## 2016-11-06 LAB — COMPREHENSIVE METABOLIC PANEL
ALBUMIN: 3.5 g/dL (ref 3.5–5.0)
ALT: 29 U/L (ref 14–54)
ANION GAP: 10 (ref 5–15)
AST: 28 U/L (ref 15–41)
Alkaline Phosphatase: 73 U/L (ref 38–126)
BILIRUBIN TOTAL: 0.7 mg/dL (ref 0.3–1.2)
BUN: 12 mg/dL (ref 6–20)
CO2: 24 mmol/L (ref 22–32)
Calcium: 9.5 mg/dL (ref 8.9–10.3)
Chloride: 102 mmol/L (ref 101–111)
Creatinine, Ser: 0.82 mg/dL (ref 0.44–1.00)
GFR calc Af Amer: >60 mL/min (ref >60)
GFR calc non Af Amer: >60 mL/min (ref >60)
GLUCOSE: 297 mg/dL — AB (ref 65–99)
POTASSIUM: 3.2 mmol/L — AB (ref 3.5–5.1)
Sodium: 136 mmol/L (ref 135–145)
TOTAL PROTEIN: 6.3 g/dL — AB (ref 6.5–8.1)

## 2016-11-06 LAB — CBC WITH DIFFERENTIAL/PLATELET
BASOS PCT: 0 %
Basophils Absolute: 0 10*3/uL (ref 0.0–0.1)
EOS PCT: 1 %
Eosinophils Absolute: 0 10*3/uL (ref 0.0–0.7)
HEMATOCRIT: 31.9 % — AB (ref 36.0–46.0)
Hemoglobin: 11.4 g/dL — ABNORMAL LOW (ref 12.0–15.0)
Lymphocytes Relative: 20 %
Lymphs Abs: 0.6 10*3/uL — ABNORMAL LOW (ref 0.7–4.0)
MCH: 32.5 pg (ref 26.0–34.0)
MCHC: 35.7 g/dL (ref 30.0–36.0)
MCV: 90.9 fL (ref 78.0–100.0)
MONO ABS: 0.2 10*3/uL (ref 0.1–1.0)
MONOS PCT: 7 %
NEUTROS ABS: 2.3 10*3/uL (ref 1.7–7.7)
Neutrophils Relative %: 72 %
PLATELETS: 248 10*3/uL (ref 150–400)
RBC: 3.51 MIL/uL — ABNORMAL LOW (ref 3.87–5.11)
RDW: 15.2 % (ref 11.5–15.5)
WBC: 3.1 10*3/uL — ABNORMAL LOW (ref 4.0–10.5)

## 2016-11-06 MED ORDER — POTASSIUM CHLORIDE 20 MEQ/15ML (10%) PO SOLN
40.0000 meq | Freq: Once | ORAL | Status: AC
  Administered 2016-11-06: 40 meq via ORAL
  Filled 2016-11-06: qty 30

## 2016-11-06 MED ORDER — PACLITAXEL CHEMO INJECTION 300 MG/50ML
80.0000 mg/m2 | Freq: Once | INTRAVENOUS | Status: AC
  Administered 2016-11-06: 156 mg via INTRAVENOUS
  Filled 2016-11-06: qty 26

## 2016-11-06 MED ORDER — HEPARIN SOD (PORK) LOCK FLUSH 100 UNIT/ML IV SOLN
500.0000 [IU] | Freq: Once | INTRAVENOUS | Status: AC | PRN
  Administered 2016-11-06: 500 [IU]
  Filled 2016-11-06: qty 5

## 2016-11-06 MED ORDER — SODIUM CHLORIDE 0.9 % IV SOLN
20.0000 mg | Freq: Once | INTRAVENOUS | Status: AC
  Administered 2016-11-06: 20 mg via INTRAVENOUS
  Filled 2016-11-06: qty 2

## 2016-11-06 MED ORDER — DIPHENHYDRAMINE HCL 50 MG/ML IJ SOLN
50.0000 mg | Freq: Once | INTRAMUSCULAR | Status: AC
  Administered 2016-11-06: 50 mg via INTRAVENOUS
  Filled 2016-11-06: qty 1

## 2016-11-06 MED ORDER — SODIUM CHLORIDE 0.9% FLUSH
10.0000 mL | INTRAVENOUS | Status: DC | PRN
  Administered 2016-11-06: 10 mL
  Filled 2016-11-06: qty 10

## 2016-11-06 MED ORDER — FAMOTIDINE IN NACL 20-0.9 MG/50ML-% IV SOLN
20.0000 mg | Freq: Once | INTRAVENOUS | Status: AC
  Administered 2016-11-06: 20 mg via INTRAVENOUS
  Filled 2016-11-06: qty 50

## 2016-11-06 MED ORDER — SODIUM CHLORIDE 0.9 % IV SOLN
Freq: Once | INTRAVENOUS | Status: AC
  Administered 2016-11-06: 11:00:00 via INTRAVENOUS

## 2016-11-06 NOTE — Progress Notes (Signed)
[name] tolerated Taxol infusion well without complaints or incident. Labs reviewed with Mike Craze NP prior to administering chemotherapy. Pt given extra dose of Potassium today and instructed to increase her dose at home to 40 meq BID for K+of 3.2.Pt verbalized understanding. VSS upon discharge. Pt discharged self ambulatory in satisfactory condition accompanied by family members

## 2016-11-06 NOTE — Patient Instructions (Signed)
[facility] Discharge Instructions for Patients Receiving Chemotherapy   Beginning January 23rd 2017 lab work for the Cancer Center will be done in the  Main lab at [facility] on 1st floor. If you have a lab appointment with the Cancer Center please come in thru the  Main Entrance and check in at the main information desk   Today you received the following chemotherapy agents Taxol. Follow-up as scheduled. Call clinic for any questions or concerns  To help prevent nausea and vomiting after your treatment, we encourage you to take your nausea medication   If you develop nausea and vomiting, or diarrhea that is not controlled by your medication, call the clinic.  The clinic phone number is [phone]. Office hours are Monday-Friday 8:30am-5:00pm.  BELOW ARE SYMPTOMS THAT SHOULD BE REPORTED IMMEDIATELY:  *FEVER GREATER THAN 101.0 F  *CHILLS WITH OR WITHOUT FEVER  NAUSEA AND VOMITING THAT IS NOT CONTROLLED WITH YOUR NAUSEA MEDICATION  *UNUSUAL SHORTNESS OF BREATH  *UNUSUAL BRUISING OR BLEEDING  TENDERNESS IN MOUTH AND THROAT WITH OR WITHOUT PRESENCE OF ULCERS  *URINARY PROBLEMS  *BOWEL PROBLEMS  UNUSUAL RASH Items with * indicate a potential emergency and should be followed up as soon as possible. If you have an emergency after office hours please contact your primary care physician or go to the nearest emergency department.  Please call the clinic during office hours if you have any questions or concerns.   You may also contact the Patient Navigator at [phone] should you have any questions or need assistance in obtaining follow up care.      Resources For Cancer Patients and their Caregivers ? American Cancer Society: Can assist with transportation, wigs, general needs, runs Look Good Feel Better.        1-888-227-6333 ? Cancer Care: Provides financial assistance, online support groups, medication/co-pay assistance.  1-800-813-HOPE  (4673) ? [facility] Assists [place] cancer patients and their families through emotional , educational and financial support.  336-427-4357 ? [place] DSS Where to apply for food stamps, Medicaid and utility assistance. 336-342-1394 ? [facility]: Transportation to medical appointments. 336-347-2287 ? Social Security Administration: May apply for disability if have a Stage IV cancer. 336-342-7796 1-800-772-1213 ? [place] Aging, Disability and Transit Services: Assists with nutrition, care and transit needs. 336-349-2343         

## 2016-11-13 ENCOUNTER — Encounter (HOSPITAL_BASED_OUTPATIENT_CLINIC_OR_DEPARTMENT_OTHER): Payer: No Typology Code available for payment source

## 2016-11-13 ENCOUNTER — Encounter (HOSPITAL_BASED_OUTPATIENT_CLINIC_OR_DEPARTMENT_OTHER): Payer: No Typology Code available for payment source | Admitting: Oncology

## 2016-11-13 ENCOUNTER — Encounter (HOSPITAL_COMMUNITY): Payer: Self-pay

## 2016-11-13 VITALS — BP 131/77 | HR 103 | Temp 98.5°F | Resp 18 | Wt 179.0 lb

## 2016-11-13 DIAGNOSIS — Z170 Estrogen receptor positive status [ER+]: Secondary | ICD-10-CM

## 2016-11-13 DIAGNOSIS — E876 Hypokalemia: Secondary | ICD-10-CM | POA: Diagnosis not present

## 2016-11-13 DIAGNOSIS — C50411 Malignant neoplasm of upper-outer quadrant of right female breast: Secondary | ICD-10-CM

## 2016-11-13 DIAGNOSIS — Z5111 Encounter for antineoplastic chemotherapy: Secondary | ICD-10-CM | POA: Diagnosis not present

## 2016-11-13 DIAGNOSIS — Z01818 Encounter for other preprocedural examination: Secondary | ICD-10-CM | POA: Diagnosis not present

## 2016-11-13 LAB — COMPREHENSIVE METABOLIC PANEL
ALK PHOS: 82 U/L (ref 38–126)
ALT: 33 U/L (ref 14–54)
ANION GAP: 11 (ref 5–15)
AST: 30 U/L (ref 15–41)
Albumin: 3.8 g/dL (ref 3.5–5.0)
BILIRUBIN TOTAL: 0.6 mg/dL (ref 0.3–1.2)
BUN: 12 mg/dL (ref 6–20)
CALCIUM: 9.6 mg/dL (ref 8.9–10.3)
CO2: 22 mmol/L (ref 22–32)
Chloride: 100 mmol/L — ABNORMAL LOW (ref 101–111)
Creatinine, Ser: 0.99 mg/dL (ref 0.44–1.00)
GFR calc Af Amer: >60 mL/min (ref >60)
GFR calc non Af Amer: >60 mL/min (ref >60)
GLUCOSE: 362 mg/dL — AB (ref 65–99)
Potassium: 3.5 mmol/L (ref 3.5–5.1)
SODIUM: 133 mmol/L — AB (ref 135–145)
Total Protein: 6.7 g/dL (ref 6.5–8.1)

## 2016-11-13 LAB — CBC WITH DIFFERENTIAL/PLATELET
BASOS ABS: 0 10*3/uL (ref 0.0–0.1)
BASOS PCT: 1 %
EOS ABS: 0 10*3/uL (ref 0.0–0.7)
Eosinophils Relative: 1 %
HEMATOCRIT: 34.6 % — AB (ref 36.0–46.0)
HEMOGLOBIN: 12.4 g/dL (ref 12.0–15.0)
Lymphocytes Relative: 21 %
Lymphs Abs: 0.8 10*3/uL (ref 0.7–4.0)
MCH: 32.9 pg (ref 26.0–34.0)
MCHC: 35.8 g/dL (ref 30.0–36.0)
MCV: 91.8 fL (ref 78.0–100.0)
Monocytes Absolute: 0.4 10*3/uL (ref 0.1–1.0)
Monocytes Relative: 10 %
NEUTROS ABS: 2.7 10*3/uL (ref 1.7–7.7)
NEUTROS PCT: 69 %
Platelets: 295 10*3/uL (ref 150–400)
RBC: 3.77 MIL/uL — ABNORMAL LOW (ref 3.87–5.11)
RDW: 14.4 % (ref 11.5–15.5)
WBC: 3.9 10*3/uL — AB (ref 4.0–10.5)

## 2016-11-13 MED ORDER — DEXAMETHASONE SODIUM PHOSPHATE 100 MG/10ML IJ SOLN
20.0000 mg | Freq: Once | INTRAMUSCULAR | Status: AC
  Administered 2016-11-13: 20 mg via INTRAVENOUS
  Filled 2016-11-13: qty 2

## 2016-11-13 MED ORDER — SODIUM CHLORIDE 0.9 % IV SOLN
Freq: Once | INTRAVENOUS | Status: AC
  Administered 2016-11-13: 13:00:00 via INTRAVENOUS

## 2016-11-13 MED ORDER — POTASSIUM CHLORIDE 20 MEQ/15ML (10%) PO SOLN: 20.0000 meq | Freq: Two times a day (BID) | ORAL | 1 refills | Status: DC

## 2016-11-13 MED ORDER — PACLITAXEL CHEMO INJECTION 300 MG/50ML
80.0000 mg/m2 | Freq: Once | INTRAVENOUS | Status: AC
  Administered 2016-11-13: 156 mg via INTRAVENOUS
  Filled 2016-11-13: qty 26

## 2016-11-13 MED ORDER — HEPARIN SOD (PORK) LOCK FLUSH 100 UNIT/ML IV SOLN
500.0000 [IU] | Freq: Once | INTRAVENOUS | Status: AC | PRN
  Administered 2016-11-13: 500 [IU]
  Filled 2016-11-13: qty 5

## 2016-11-13 MED ORDER — DIPHENHYDRAMINE HCL 50 MG/ML IJ SOLN
50.0000 mg | Freq: Once | INTRAMUSCULAR | Status: AC
  Administered 2016-11-13: 50 mg via INTRAVENOUS
  Filled 2016-11-13: qty 1

## 2016-11-13 MED ORDER — SODIUM CHLORIDE 0.9% FLUSH: 10.0000 mL | INTRAVENOUS | Status: DC | PRN

## 2016-11-13 MED ORDER — LIDOCAINE-PRILOCAINE 2.5-2.5 % EX CREA: TOPICAL_CREAM | CUTANEOUS | 2 refills | Status: DC

## 2016-11-13 MED ORDER — FAMOTIDINE IN NACL 20-0.9 MG/50ML-% IV SOLN
20.0000 mg | Freq: Once | INTRAVENOUS | Status: AC
  Administered 2016-11-13: 20 mg via INTRAVENOUS
  Filled 2016-11-13: qty 50

## 2016-11-13 NOTE — Progress Notes (Signed)
Unable to obtain blood for lab work through port a cath.  Labs obtained via peripheral stick with 23 g needle left hand.  Pt tolerated well.  Pressure dsg applied to left hand at site of stick.   Tolerated tx w/o adverse reaction.  Alert, in no distress.  VSS.  Discharged ambulatory in c/o son for transport home.

## 2016-11-13 NOTE — Patient Instructions (Signed)
[facility] Discharge Instructions for Patients Receiving Chemotherapy   Beginning January 23rd 2017 lab work for the Cancer Center will be done in the  Main lab at  on 1st floor. If you have a lab appointment with the Cancer Center please come in thru the  Main Entrance and check in at the main information desk   Today you received the following chemotherapy agents:  Taxol  If you develop nausea and vomiting, or diarrhea that is not controlled by your medication, call the clinic.  The clinic phone number is [phone]. Office hours are Monday-Friday 8:30am-5:00pm.  BELOW ARE SYMPTOMS THAT SHOULD BE REPORTED IMMEDIATELY:  *FEVER GREATER THAN 101.0 F  *CHILLS WITH OR WITHOUT FEVER  NAUSEA AND VOMITING THAT IS NOT CONTROLLED WITH YOUR NAUSEA MEDICATION  *UNUSUAL SHORTNESS OF BREATH  *UNUSUAL BRUISING OR BLEEDING  TENDERNESS IN MOUTH AND THROAT WITH OR WITHOUT PRESENCE OF ULCERS  *URINARY PROBLEMS  *BOWEL PROBLEMS  UNUSUAL RASH Items with * indicate a potential emergency and should be followed up as soon as possible. If you have an emergency after office hours please contact your primary care physician or go to the nearest emergency department.  Please call the clinic during office hours if you have any questions or concerns.   You may also contact the Patient Navigator at [phone] should you have any questions or need assistance in obtaining follow up care.      Resources For Cancer Patients and their Caregivers ? American Cancer Society: Can assist with transportation, wigs, general needs, runs Look Good Feel Better.        1-888-227-6333 ? Cancer Care: Provides financial assistance, online support groups, medication/co-pay assistance.  1-800-813-HOPE (4673) ? [facility] Assists [place] cancer patients and their families through emotional , educational and financial support.  336-427-4357 ? [place] DSS Where to apply for food stamps, Medicaid and utility assistance. 336-342-1394 ? [facility]: Transportation to medical appointments. 336-347-2287 ? Social Security Administration: May apply for disability if have a Stage IV cancer. 336-342-7796 1-800-772-1213 ? [place] Aging, Disability and Transit Services: Assists with nutrition, care and transit needs. 336-349-2343         

## 2016-11-13 NOTE — Progress Notes (Signed)
[place]  PROGRESS NOTE  Patient Care Team: Monico Blitz, MD as PCP - General (Internal Medicine)  CHIEF COMPLAINTS/PURPOSE OF CONSULTATION:   clinical T2 N1 M0 Stage 2b, ER 80% positive, PR negative, HER-2 negative, Ki-67 90% of right breast.   Breast cancer of upper-outer quadrant of right female breast ([place])   06/24/2016 Mammogram    Diagnostic bilateral mammogram: highly suspicious 3.5 cm mass in the UO R breast with marked enlarged R axillary LN      06/24/2016 Breast US    2.7 x 3.1 x 3.5 cm slightly irregular hypoechoic mass at the 10:00 position of right breast, 6 cm from the nipple.  4.1 x 6 cm right axillar lymph node also identified.      07/01/2016 Pathology Results    Infiltrating ductal carcinoma ER+ 80%, PR-, HER 2-, Ki67 90%      07/01/2016 Initial Biopsy    Ultrasound guided R breast biopsy and R axillary LN biopsy both were clipped post biopsy      07/20/2016 Imaging    MUGA- Upper normal left ventricular ejection fraction of 75% with normal LV wall motion.      07/21/2016 Imaging    CT CAP- 2.9 cm right upper outer quadrant breast mass, consistent with known primary breast carcinoma.  Right axillary and subpectoral lymphadenopathy, consistent with metastatic disease.  Tiny sub-cm bilateral pulmonary nodules are indeterminate, but favor postinflammatory etiology over early metastases. Recommend continued attention on follow-up CT.  No evidence of metastatic disease within the abdomen or pelvis.      07/23/2016 Procedure    Port placed by Dr. Barry Dienes      07/24/2016 -  Neo-Adjuvant Chemotherapy    Adriamycin/Cytoxan x 4, followed by weekly Taxol x 12  (current therapy)       07/30/2016 Imaging    Bone scan: Negative for osseous disease .      09/22/2016 Imaging    CT head- 1. Normal CT of the brain. No evidence of intracranial metastatic disease. 2. Frothy secretions throughout most of the paranasal sinuses. Correlate  clinically for acute sinusitis. This could serve as a source of headache.      HISTORY OF PRESENTING ILLNESS:  [name] 61 y.o. female is here for follow up of right breast cancer. Started neo-adjuvant chemotherapy Adriamycin/Cytoxan with weekly Taxol on 07/24/2016.   The patient presents to the clinic today for follow up and cycle 9 Taxol. She is accompanied by family today.  She reports she is doing well. She reports ongoing numbness and tingling to her fingers and hands; this is not worsening. She denies chest pain, shortness of breath, and lower extremity swelling. She reports her appetite is stable.   She requests another note for her HR department/employer to explain her absence and the length of her treatment.    MEDICAL HISTORY:  Past Medical History:  Diagnosis Date  . Arthritis   . Breast cancer of upper-outer quadrant of right female breast ([place]) 07/14/2016  . Cancer ([place])   . Cough    dry  . Diabetes mellitus without complication ([place])   . Hypertension   . Ruptured eardrum    left     SURGICAL HISTORY: Past Surgical History:  Procedure Laterality Date  . ABDOMINAL HYSTERECTOMY     with bilateral bso  . COLONOSCOPY    . PORTACATH PLACEMENT Left 07/23/2016   Procedure: INSERTION PORT-A-CATH;  Surgeon: Stark Klein, MD;  Location: [facility];  Service: General;  Laterality: Left;    SOCIAL HISTORY: Social History   Social History  . Marital status: Single    Spouse name: N/A  . Number of children: N/A  . Years of education: N/A   Occupational History  . Not on file.   Social History Main Topics  . Smoking status: Former Smoker    Types: Cigarettes  . Smokeless tobacco: Never Used  . Alcohol use No  . Drug use: No  . Sexual activity: Not on file   Other Topics Concern  . Not on file   Social History Narrative  . No narrative on file   She is single She doesn't smoke, no alcohol use.  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 Loves flowers and doing yard work. She loves games on her tablet.   FAMILY HISTORY: History reviewed. No pertinent family history. 2 sisters 5 brothers No  family history of breast cancer or prostate cancer  Sister has history of colon cancer- she had 2 hernias removed Mom- 34 deceased, was on dialysis and heart got weaker and weaker.  Dad- 64 deceased, - was a heavy drinker   Has 2 boys, 52 and 21 years of age 26 grandson- 60 months old   ALLERGIES:  has No Known Allergies.  MEDICATIONS:  Current Outpatient Prescriptions  Medication Sig Dispense Refill  . ALPRAZolam (XANAX) 0.5 MG tablet Take 1 tablet (0.5 mg total) by mouth 3 (three) times daily as needed for anxiety. 60 tablet 0  . amLODipine (NORVASC) 10 MG tablet Take 10 mg by mouth daily.    [name] amoxicillin-clavulanate (AUGMENTIN) 875-125 MG tablet Take 1 tablet by mouth 2 (two) times daily. 20 tablet 0  . aspirin EC 81 MG tablet Take 81 mg by mouth daily.    [name] atorvastatin (LIPITOR) 10 MG tablet Take 10 mg by mouth every evening.  4  . glyBURIDE-metformin (GLUCOVANCE) 5-500 MG tablet Take 1 tablet by mouth 2 (two) times daily.  4  . hydrochlorothiazide (HYDRODIURIL) 25 MG tablet Take 25 mg by mouth daily.    [name] ibuprofen (ADVIL,MOTRIN) 800 MG tablet Take 800 mg by mouth every 8 (eight) hours as needed.  0  . lidocaine-prilocaine (EMLA) cream Apply a quarter size amount to affected area 1 hour prior to coming to chemotherapy. 30 g 2  . lisinopril (PRINIVIL,ZESTRIL) 40 MG tablet Take 40 mg by mouth daily.    . ondansetron (ZOFRAN) 8 MG tablet Take 1 tablet (8 mg total) by mouth every 8 (eight) hours as needed for nausea or vomiting. 30 tablet 2  . oxyCODONE (OXY IR/ROXICODONE) 5 MG immediate release tablet Take 1-2 tablets (5-10 mg total) by mouth every 6 (six) hours as needed for moderate pain, severe pain or breakthrough pain. 30 tablet 0  . potassium  chloride 20 MEQ/15ML (10%) SOLN Take 15 mLs (20 mEq total) by mouth 2 (two) times daily. 473 mL 1  . prochlorperazine (COMPAZINE) 10 MG tablet Take 1 tablet (10 mg total) by mouth every 6 (six) hours as needed for nausea or vomiting. 30 tablet 2  . temazepam (RESTORIL) 30 MG capsule      No current facility-administered medications for this visit.    Review of Systems  Constitutional: Negative.  Negative for malaise/fatigue.       No loss of appetite  HENT: Positive for congestion (allergies).   Eyes: Negative.   Respiratory:  Negative.  Negative for shortness of breath.   Cardiovascular: Negative.  Negative for chest pain and leg swelling.  Gastrointestinal: Negative for abdominal pain. Vomiting: 3 episodes, 1 day, recovered now.  Genitourinary: Negative.   Musculoskeletal: Negative.   Skin: Negative.   Neurological: Positive for tingling (fingers and hands, stable).  Endo/Heme/Allergies: Negative.   Psychiatric/Behavioral: Negative.   All other systems reviewed and are negative. 14 point ROS was done and is otherwise as detailed above or in HPI  PHYSICAL EXAMINATION: ECOG PERFORMANCE STATUS: 0 - Asymptomatic   Vitals with BMI 11/13/2016  Height   Weight 179 lbs  BMI   Systolic 938  Diastolic 71  Pulse 101  Respirations 18   Physical Exam  Constitutional: She is oriented to person, place, and time and well-developed, well-nourished, and in no distress.  HENT:  Head: Normocephalic and atraumatic.  Eyes: Conjunctivae and EOM are normal. Pupils are equal, round, and reactive to light.  Neck: Normal range of motion. Neck supple.  Cardiovascular: Normal rate, regular rhythm and normal heart sounds.  Exam reveals no gallop and no friction rub.   No murmur heard. Pulmonary/Chest: Effort normal and breath sounds normal. No respiratory distress. She has no wheezes. She has no rales. She exhibits no tenderness.  Abdominal: Soft. Bowel sounds are normal. She exhibits no distension  and no mass. There is no tenderness. There is no rebound and no guarding.  Musculoskeletal: Normal range of motion. She exhibits no edema.  Neurological: She is alert and oriented to person, place, and time. Gait normal.  Skin: Skin is warm and dry.  Darkening under nails  Psychiatric: Mood, memory, affect and judgment normal.  Nursing note and vitals reviewed.  LABORATORY DATA:  I have reviewed the data as listed Lab Results  Component Value Date   WBC 3.9 (L) 11/13/2016   HGB 12.4 11/13/2016   HCT 34.6 (L) 11/13/2016   MCV 91.8 11/13/2016   PLT 295 11/13/2016   CMP     Component Value Date/Time   NA 136 11/06/2016 0947   K 3.2 (L) 11/06/2016 0947   CL 102 11/06/2016 0947   CO2 24 11/06/2016 0947   GLUCOSE 297 (H) 11/06/2016 0947   BUN 12 11/06/2016 0947   CREATININE 0.82 11/06/2016 0947   CALCIUM 9.5 11/06/2016 0947   PROT 6.3 (L) 11/06/2016 0947   ALBUMIN 3.5 11/06/2016 0947   AST 28 11/06/2016 0947   ALT 29 11/06/2016 0947   ALKPHOS 73 11/06/2016 0947   BILITOT 0.7 11/06/2016 0947   GFRNONAA >60 11/06/2016 0947   GFRAA >60 11/06/2016 0947   RADIOGRAPHIC STUDIES: I have personally reviewed the radiological images as listed and agreed with the findings in the report.  CT Head 09/21/2016 IMPRESSION: 1. Normal CT of the brain. No evidence of intracranial metastatic disease. 2. Frothy secretions throughout most of the paranasal sinuses. Correlate clinically for acute sinusitis. This could serve as a source of headache.  PATHOLOGY:        ASSESSMENT & PLAN:  Clinical T2 N1 M0 Stage 2b  ER 80% positive, PR negative, HER-2 negative, Ki-67 90% of right breast.  Pathology reviewed. Results noted above. We discussed breast cancer in a general sense and I explained ER, PR and HER 2. We discussed the different types of breast cancer. I explained the role of chemotherapy prior to surgery ie. Neoadjuvant, and the role of endocrine therapy and radiation therapy. She was  provided with several sources of educational material.  She is  clinical T2 N1 M0 Stage IIb ER 80% positive, PR negative, HER-2 negative, Ki-67 90%.  Currently on neoadjuvant chemotherapy with dose dense AC followed by 12 week of  taxol.   PLAN: Continue with cycle 9 of taxol today. Continue as planned for total of 12 cycles of weekly taxol. 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  We had already provided a detailed letter to give to her job on her last f/u visit and therefore I am unclear about what more information they need. I also encouraged the patient to have her HR department call our clinic to confirm the information needed.  Refilled her liquid potassium and EMLA cream.  She will return for follow up in 2 weeks.    This document serves as a record of services personally performed by Twana First, MD. It was created on her behalf by Maryla Morrow, a trained medical scribe. The creation of this record is based on the scribe's personal observations and the provider's statements to them. This document has been checked and approved by the attending provider.  I have reviewed the above documentation for accuracy and completeness and I agree with the above.  This note was electronically signed by:  Twana First, MD 11/13/2016 12:19 PM

## 2016-11-13 NOTE — Patient Instructions (Signed)
[facility] at [facility] Discharge Instructions  RECOMMENDATIONS MADE BY THE CONSULTANT AND ANY TEST RESULTS WILL BE SENT TO YOUR REFERRING PHYSICIAN.  You were seen today by Dr. Louise Zhou    Thank you for choosing [facility] at [facility] to provide your oncology and hematology care.  To afford each patient quality time with our provider, please arrive at least 15 minutes before your scheduled appointment time.    If you have a lab appointment with the Cancer Center please come in thru the  Main Entrance and check in at the main information desk  You need to re-schedule your appointment should you arrive 10 or more minutes late.  We strive to give you quality time with our providers, and arriving late affects you and other patients whose appointments are after yours.  Also, if you no show three or more times for appointments you may be dismissed from the clinic at the providers discretion.     Again, thank you for choosing [facility].  Our hope is that these requests will decrease the amount of time that you wait before being seen by our physicians.       _____________________________________________________________  Should you have questions after your visit to [facility], please contact our office at [phone] between the hours of 8:30 a.m. and 4:30 p.m.  Voicemails left after 4:30 p.m. will not be returned until the following business day.  For prescription refill requests, have your pharmacy contact our office.       Resources For Cancer Patients and their Caregivers ? American Cancer Society: Can assist with transportation, wigs, general needs, runs Look Good Feel Better.        1-888-227-6333 ? Cancer Care: Provides financial assistance, online support groups, medication/co-pay assistance.  1-800-813-HOPE (4673) ? [facility] Assists [place] cancer patients and their  families through emotional , educational and financial support.  336-427-4357 ? [place] DSS Where to apply for food stamps, Medicaid and utility assistance. 336-342-1394 ? RCATS: Transportation to medical appointments. 336-347-2287 ? Social Security Administration: May apply for disability if have a Stage IV cancer. 336-342-7796 1-800-772-1213 ? [place] Aging, Disability and Transit Services: Assists with nutrition, care and transit needs. 336-349-2343  Cancer Center Support Programs: @10RELATIVEDAYS@ > Cancer Support Group  2nd Tuesday of the month 1pm-2pm, Journey Room  > Creative Journey  3rd Tuesday of the month 1130am-1pm, Journey Room  > Look Good Feel Better  1st Wednesday of the month 10am-12 noon, Journey Room (Call [employer] to register 1-800-395-5775)    

## 2016-11-19 ENCOUNTER — Other Ambulatory Visit (HOSPITAL_COMMUNITY): Payer: Self-pay | Admitting: *Deleted

## 2016-11-19 DIAGNOSIS — C50411 Malignant neoplasm of upper-outer quadrant of right female breast: Secondary | ICD-10-CM

## 2016-11-20 ENCOUNTER — Other Ambulatory Visit (HOSPITAL_COMMUNITY): Payer: No Typology Code available for payment source

## 2016-11-20 ENCOUNTER — Encounter (HOSPITAL_BASED_OUTPATIENT_CLINIC_OR_DEPARTMENT_OTHER): Payer: No Typology Code available for payment source

## 2016-11-20 ENCOUNTER — Encounter (HOSPITAL_COMMUNITY): Payer: Self-pay

## 2016-11-20 VITALS — BP 133/80 | HR 100 | Temp 98.3°F | Resp 18 | Wt 179.4 lb

## 2016-11-20 DIAGNOSIS — C50411 Malignant neoplasm of upper-outer quadrant of right female breast: Secondary | ICD-10-CM

## 2016-11-20 DIAGNOSIS — Z01818 Encounter for other preprocedural examination: Secondary | ICD-10-CM | POA: Diagnosis not present

## 2016-11-20 DIAGNOSIS — Z5111 Encounter for antineoplastic chemotherapy: Secondary | ICD-10-CM

## 2016-11-20 DIAGNOSIS — Z170 Estrogen receptor positive status [ER+]: Secondary | ICD-10-CM

## 2016-11-20 LAB — COMPREHENSIVE METABOLIC PANEL
ALBUMIN: 3.6 g/dL (ref 3.5–5.0)
ALK PHOS: 82 U/L (ref 38–126)
ALT: 31 U/L (ref 14–54)
ANION GAP: 10 (ref 5–15)
AST: 24 U/L (ref 15–41)
BILIRUBIN TOTAL: 0.8 mg/dL (ref 0.3–1.2)
BUN: 15 mg/dL (ref 6–20)
CALCIUM: 9.9 mg/dL (ref 8.9–10.3)
CO2: 22 mmol/L (ref 22–32)
Chloride: 102 mmol/L (ref 101–111)
Creatinine, Ser: 0.89 mg/dL (ref 0.44–1.00)
GLUCOSE: 319 mg/dL — AB (ref 65–99)
Potassium: 3.6 mmol/L (ref 3.5–5.1)
Sodium: 134 mmol/L — ABNORMAL LOW (ref 135–145)
TOTAL PROTEIN: 6.5 g/dL (ref 6.5–8.1)

## 2016-11-20 LAB — CBC WITH DIFFERENTIAL/PLATELET
BASOS ABS: 0 10*3/uL (ref 0.0–0.1)
BASOS PCT: 0 %
EOS ABS: 0 10*3/uL (ref 0.0–0.7)
EOS PCT: 1 %
HCT: 34.5 % — ABNORMAL LOW (ref 36.0–46.0)
Hemoglobin: 12.2 g/dL (ref 12.0–15.0)
LYMPHS PCT: 23 %
Lymphs Abs: 0.8 10*3/uL (ref 0.7–4.0)
MCH: 32.3 pg (ref 26.0–34.0)
MCHC: 35.4 g/dL (ref 30.0–36.0)
MCV: 91.3 fL (ref 78.0–100.0)
Monocytes Absolute: 0.2 10*3/uL (ref 0.1–1.0)
Monocytes Relative: 7 %
Neutro Abs: 2.4 10*3/uL (ref 1.7–7.7)
Neutrophils Relative %: 69 %
PLATELETS: 281 10*3/uL (ref 150–400)
RBC: 3.78 MIL/uL — AB (ref 3.87–5.11)
RDW: 13.7 % (ref 11.5–15.5)
WBC: 3.5 10*3/uL — AB (ref 4.0–10.5)

## 2016-11-20 MED ORDER — SODIUM CHLORIDE 0.9 % IV SOLN
Freq: Once | INTRAVENOUS | Status: AC
  Administered 2016-11-20: 11:00:00 via INTRAVENOUS

## 2016-11-20 MED ORDER — SODIUM CHLORIDE 0.9 % IV SOLN
20.0000 mg | Freq: Once | INTRAVENOUS | Status: AC
  Administered 2016-11-20: 20 mg via INTRAVENOUS
  Filled 2016-11-20: qty 2

## 2016-11-20 MED ORDER — DIPHENHYDRAMINE HCL 50 MG/ML IJ SOLN
50.0000 mg | Freq: Once | INTRAMUSCULAR | Status: AC
  Administered 2016-11-20: 50 mg via INTRAVENOUS
  Filled 2016-11-20: qty 1

## 2016-11-20 MED ORDER — HEPARIN SOD (PORK) LOCK FLUSH 100 UNIT/ML IV SOLN
500.0000 [IU] | Freq: Once | INTRAVENOUS | Status: AC | PRN
  Administered 2016-11-20: 500 [IU]
  Filled 2016-11-20: qty 5

## 2016-11-20 MED ORDER — SODIUM CHLORIDE 0.9% FLUSH
10.0000 mL | INTRAVENOUS | Status: DC | PRN
  Administered 2016-11-20: 10 mL
  Filled 2016-11-20: qty 10

## 2016-11-20 MED ORDER — FAMOTIDINE IN NACL 20-0.9 MG/50ML-% IV SOLN
20.0000 mg | Freq: Once | INTRAVENOUS | Status: AC
  Administered 2016-11-20: 20 mg via INTRAVENOUS
  Filled 2016-11-20: qty 50

## 2016-11-20 MED ORDER — DEXTROSE 5 % IV SOLN
80.0000 mg/m2 | Freq: Once | INTRAVENOUS | Status: AC
  Administered 2016-11-20: 156 mg via INTRAVENOUS
  Filled 2016-11-20: qty 26

## 2016-11-20 NOTE — Progress Notes (Signed)
[name] tolerated chemo tx well without complaints or incident. Labs reviewed with Dr. Talbert Cage prior to administering chemotherapy. VSS upon discharge. Pt discharged self ambulatory in satisfactory condition accompanied by family member

## 2016-11-20 NOTE — Patient Instructions (Signed)
[facility] Discharge Instructions for Patients Receiving Chemotherapy   Beginning January 23rd 2017 lab work for the Cancer Center will be done in the  Main lab at [facility] on 1st floor. If you have a lab appointment with the Cancer Center please come in thru the  Main Entrance and check in at the main information desk   Today you received the following chemotherapy agents Taxol. Follow-up as scheduled. Call clinic for any questions or concerns  To help prevent nausea and vomiting after your treatment, we encourage you to take your nausea medication   If you develop nausea and vomiting, or diarrhea that is not controlled by your medication, call the clinic.  The clinic phone number is [phone]. Office hours are Monday-Friday 8:30am-5:00pm.  BELOW ARE SYMPTOMS THAT SHOULD BE REPORTED IMMEDIATELY:  *FEVER GREATER THAN 101.0 F  *CHILLS WITH OR WITHOUT FEVER  NAUSEA AND VOMITING THAT IS NOT CONTROLLED WITH YOUR NAUSEA MEDICATION  *UNUSUAL SHORTNESS OF BREATH  *UNUSUAL BRUISING OR BLEEDING  TENDERNESS IN MOUTH AND THROAT WITH OR WITHOUT PRESENCE OF ULCERS  *URINARY PROBLEMS  *BOWEL PROBLEMS  UNUSUAL RASH Items with * indicate a potential emergency and should be followed up as soon as possible. If you have an emergency after office hours please contact your primary care physician or go to the nearest emergency department.  Please call the clinic during office hours if you have any questions or concerns.   You may also contact the Patient Navigator at [phone] should you have any questions or need assistance in obtaining follow up care.      Resources For Cancer Patients and their Caregivers ? American Cancer Society: Can assist with transportation, wigs, general needs, runs Look Good Feel Better.        1-888-227-6333 ? Cancer Care: Provides financial assistance, online support groups, medication/co-pay assistance.  1-800-813-HOPE  (4673) ? [facility] Assists [place] cancer patients and their families through emotional , educational and financial support.  336-427-4357 ? [place] DSS Where to apply for food stamps, Medicaid and utility assistance. 336-342-1394 ? RCATS: Transportation to medical appointments. 336-347-2287 ? Social Security Administration: May apply for disability if have a Stage IV cancer. 336-342-7796 1-800-772-1213 ? [place] Aging, Disability and Transit Services: Assists with nutrition, care and transit needs. 336-349-2343         

## 2016-11-26 ENCOUNTER — Other Ambulatory Visit (HOSPITAL_COMMUNITY): Payer: Self-pay | Admitting: *Deleted

## 2016-11-26 DIAGNOSIS — C50411 Malignant neoplasm of upper-outer quadrant of right female breast: Secondary | ICD-10-CM

## 2016-11-27 ENCOUNTER — Encounter (HOSPITAL_COMMUNITY): Payer: Self-pay

## 2016-11-27 ENCOUNTER — Encounter (HOSPITAL_COMMUNITY): Payer: No Typology Code available for payment source

## 2016-11-27 ENCOUNTER — Encounter (HOSPITAL_BASED_OUTPATIENT_CLINIC_OR_DEPARTMENT_OTHER): Payer: No Typology Code available for payment source

## 2016-11-27 VITALS — BP 119/74 | HR 109 | Temp 98.3°F | Resp 18 | Wt 177.8 lb

## 2016-11-27 DIAGNOSIS — Z5111 Encounter for antineoplastic chemotherapy: Secondary | ICD-10-CM

## 2016-11-27 DIAGNOSIS — Z170 Estrogen receptor positive status [ER+]: Principal | ICD-10-CM

## 2016-11-27 DIAGNOSIS — C50411 Malignant neoplasm of upper-outer quadrant of right female breast: Secondary | ICD-10-CM | POA: Diagnosis not present

## 2016-11-27 DIAGNOSIS — Z01818 Encounter for other preprocedural examination: Secondary | ICD-10-CM | POA: Diagnosis not present

## 2016-11-27 LAB — COMPREHENSIVE METABOLIC PANEL
ALT: 29 U/L (ref 14–54)
ANION GAP: 8 (ref 5–15)
AST: 25 U/L (ref 15–41)
Albumin: 3.8 g/dL (ref 3.5–5.0)
Alkaline Phosphatase: 85 U/L (ref 38–126)
BILIRUBIN TOTAL: 0.9 mg/dL (ref 0.3–1.2)
BUN: 19 mg/dL (ref 6–20)
CO2: 26 mmol/L (ref 22–32)
Calcium: 9.9 mg/dL (ref 8.9–10.3)
Chloride: 97 mmol/L — ABNORMAL LOW (ref 101–111)
Creatinine, Ser: 1.01 mg/dL — ABNORMAL HIGH (ref 0.44–1.00)
Glucose, Bld: 374 mg/dL — ABNORMAL HIGH (ref 65–99)
POTASSIUM: 3.8 mmol/L (ref 3.5–5.1)
Sodium: 131 mmol/L — ABNORMAL LOW (ref 135–145)
TOTAL PROTEIN: 6.9 g/dL (ref 6.5–8.1)

## 2016-11-27 LAB — CBC WITH DIFFERENTIAL/PLATELET
BASOS ABS: 0.1 10*3/uL (ref 0.0–0.1)
Basophils Relative: 1 %
Eosinophils Absolute: 0.1 10*3/uL (ref 0.0–0.7)
Eosinophils Relative: 1 %
HEMATOCRIT: 36.1 % (ref 36.0–46.0)
Hemoglobin: 12.6 g/dL (ref 12.0–15.0)
LYMPHS ABS: 1.2 10*3/uL (ref 0.7–4.0)
Lymphocytes Relative: 24 %
MCH: 32.1 pg (ref 26.0–34.0)
MCHC: 34.9 g/dL (ref 30.0–36.0)
MCV: 91.9 fL (ref 78.0–100.0)
MONOS PCT: 10 %
Monocytes Absolute: 0.5 10*3/uL (ref 0.1–1.0)
NEUTROS ABS: 3 10*3/uL (ref 1.7–7.7)
Neutrophils Relative %: 64 %
Platelets: 281 10*3/uL (ref 150–400)
RBC: 3.93 MIL/uL (ref 3.87–5.11)
RDW: 13.1 % (ref 11.5–15.5)
WBC: 4.9 10*3/uL (ref 4.0–10.5)

## 2016-11-27 MED ORDER — SODIUM CHLORIDE 0.9 % IV SOLN
Freq: Once | INTRAVENOUS | Status: AC
  Administered 2016-11-27: 12:00:00 via INTRAVENOUS

## 2016-11-27 MED ORDER — DIPHENHYDRAMINE HCL 50 MG/ML IJ SOLN
50.0000 mg | Freq: Once | INTRAMUSCULAR | Status: AC
  Administered 2016-11-27: 50 mg via INTRAVENOUS
  Filled 2016-11-27: qty 1

## 2016-11-27 MED ORDER — FAMOTIDINE IN NACL 20-0.9 MG/50ML-% IV SOLN
20.0000 mg | Freq: Once | INTRAVENOUS | Status: AC
  Administered 2016-11-27: 20 mg via INTRAVENOUS
  Filled 2016-11-27: qty 50

## 2016-11-27 MED ORDER — HEPARIN SOD (PORK) LOCK FLUSH 100 UNIT/ML IV SOLN
500.0000 [IU] | Freq: Once | INTRAVENOUS | Status: AC | PRN
  Administered 2016-11-27: 500 [IU]
  Filled 2016-11-27: qty 5

## 2016-11-27 MED ORDER — SODIUM CHLORIDE 0.9% FLUSH
10.0000 mL | INTRAVENOUS | Status: DC | PRN
  Administered 2016-11-27: 10 mL
  Filled 2016-11-27: qty 10

## 2016-11-27 MED ORDER — SODIUM CHLORIDE 0.9 % IV SOLN
20.0000 mg | Freq: Once | INTRAVENOUS | Status: AC
  Administered 2016-11-27: 20 mg via INTRAVENOUS
  Filled 2016-11-27: qty 2

## 2016-11-27 MED ORDER — PACLITAXEL CHEMO INJECTION 300 MG/50ML
80.0000 mg/m2 | Freq: Once | INTRAVENOUS | Status: AC
  Administered 2016-11-27: 156 mg via INTRAVENOUS
  Filled 2016-11-27: qty 26

## 2016-11-27 NOTE — Patient Instructions (Signed)
[facility] Discharge Instructions for Patients Receiving Chemotherapy   Beginning January 23rd 2017 lab work for the Cancer Center will be done in the  Main lab at [facility] on 1st floor. If you have a lab appointment with the Cancer Center please come in thru the  Main Entrance and check in at the main information desk   Today you received the following chemotherapy agents   To help prevent nausea and vomiting after your treatment, we encourage you to take your nausea medication     If you develop nausea and vomiting, or diarrhea that is not controlled by your medication, call the clinic.  The clinic phone number is [phone]. Office hours are Monday-Friday 8:30am-5:00pm.  BELOW ARE SYMPTOMS THAT SHOULD BE REPORTED IMMEDIATELY:  *FEVER GREATER THAN 101.0 F  *CHILLS WITH OR WITHOUT FEVER  NAUSEA AND VOMITING THAT IS NOT CONTROLLED WITH YOUR NAUSEA MEDICATION  *UNUSUAL SHORTNESS OF BREATH  *UNUSUAL BRUISING OR BLEEDING  TENDERNESS IN MOUTH AND THROAT WITH OR WITHOUT PRESENCE OF ULCERS  *URINARY PROBLEMS  *BOWEL PROBLEMS  UNUSUAL RASH Items with * indicate a potential emergency and should be followed up as soon as possible. If you have an emergency after office hours please contact your primary care physician or go to the nearest emergency department.  Please call the clinic during office hours if you have any questions or concerns.   You may also contact the Patient Navigator at [phone] should you have any questions or need assistance in obtaining follow up care.      Resources For Cancer Patients and their Caregivers ? American Cancer Society: Can assist with transportation, wigs, general needs, runs Look Good Feel Better.        1-888-227-6333 ? Cancer Care: Provides financial assistance, online support groups, medication/co-pay assistance.  1-800-813-HOPE (4673) ? [facility] Assists [place] cancer  patients and their families through emotional , educational and financial support.  336-427-4357 ? [place] DSS Where to apply for food stamps, Medicaid and utility assistance. 336-342-1394 ? RCATS: Transportation to medical appointments. 336-347-2287 ? Social Security Administration: May apply for disability if have a Stage IV cancer. 336-342-7796 1-800-772-1213 ? [place] Aging, Disability and Transit Services: Assists with nutrition, care and transit needs. 336-349-2343         

## 2016-11-27 NOTE — Progress Notes (Signed)
Labs reviewed with MD. Proceed with treatment.  Upon patient receiving her pre-med, the infusion pump beeped and patient stated that she felt an electricity feeling go thru her body. She states that she feels this when the pump makes a beep?  Will proceed with treatment today per MD and will get a dye study done prior to treatment next week. Vitals stable and discharged home from clinic ambulatory. Follow up as scheduled.

## 2016-12-02 ENCOUNTER — Other Ambulatory Visit (HOSPITAL_COMMUNITY): Payer: Self-pay | Admitting: *Deleted

## 2016-12-02 DIAGNOSIS — C50411 Malignant neoplasm of upper-outer quadrant of right female breast: Secondary | ICD-10-CM

## 2016-12-04 ENCOUNTER — Other Ambulatory Visit (HOSPITAL_COMMUNITY): Payer: No Typology Code available for payment source

## 2016-12-04 ENCOUNTER — Encounter (HOSPITAL_BASED_OUTPATIENT_CLINIC_OR_DEPARTMENT_OTHER): Payer: No Typology Code available for payment source | Admitting: Adult Health

## 2016-12-04 ENCOUNTER — Ambulatory Visit (HOSPITAL_COMMUNITY)
Admission: RE | Admit: 2016-12-04 | Discharge: 2016-12-04 | Disposition: A | Discharge status: Home / Self Care | Payer: No Typology Code available for payment source | Source: Ambulatory Visit | Attending: Oncology | Admitting: Oncology

## 2016-12-04 ENCOUNTER — Encounter (HOSPITAL_COMMUNITY): Payer: No Typology Code available for payment source | Attending: Hematology & Oncology

## 2016-12-04 ENCOUNTER — Other Ambulatory Visit (HOSPITAL_COMMUNITY): Payer: Self-pay | Admitting: Oncology

## 2016-12-04 ENCOUNTER — Encounter (HOSPITAL_COMMUNITY): Payer: Self-pay | Admitting: Adult Health

## 2016-12-04 VITALS — BP 134/72 | HR 100 | Temp 98.6°F | Resp 18 | Wt 177.0 lb

## 2016-12-04 DIAGNOSIS — Z170 Estrogen receptor positive status [ER+]: Secondary | ICD-10-CM

## 2016-12-04 DIAGNOSIS — Z452 Encounter for adjustment and management of vascular access device: Secondary | ICD-10-CM | POA: Diagnosis not present

## 2016-12-04 DIAGNOSIS — G609 Hereditary and idiopathic neuropathy, unspecified: Secondary | ICD-10-CM

## 2016-12-04 DIAGNOSIS — C50411 Malignant neoplasm of upper-outer quadrant of right female breast: Secondary | ICD-10-CM

## 2016-12-04 DIAGNOSIS — Z01818 Encounter for other preprocedural examination: Secondary | ICD-10-CM | POA: Insufficient documentation

## 2016-12-04 DIAGNOSIS — Z5111 Encounter for antineoplastic chemotherapy: Secondary | ICD-10-CM | POA: Diagnosis not present

## 2016-12-04 LAB — COMPREHENSIVE METABOLIC PANEL
ALBUMIN: 3.9 g/dL (ref 3.5–5.0)
ALK PHOS: 79 U/L (ref 38–126)
ALT: 43 U/L (ref 14–54)
ANION GAP: 10 (ref 5–15)
AST: 28 U/L (ref 15–41)
BILIRUBIN TOTAL: 0.7 mg/dL (ref 0.3–1.2)
BUN: 17 mg/dL (ref 6–20)
CALCIUM: 9.7 mg/dL (ref 8.9–10.3)
CO2: 24 mmol/L (ref 22–32)
Chloride: 102 mmol/L (ref 101–111)
Creatinine, Ser: 0.83 mg/dL (ref 0.44–1.00)
GFR calc Af Amer: >60 mL/min (ref >60)
GLUCOSE: 295 mg/dL — AB (ref 65–99)
Potassium: 3.5 mmol/L (ref 3.5–5.1)
Sodium: 136 mmol/L (ref 135–145)
TOTAL PROTEIN: 7 g/dL (ref 6.5–8.1)

## 2016-12-04 LAB — CBC WITH DIFFERENTIAL/PLATELET
BASOS PCT: 0 %
Basophils Absolute: 0 10*3/uL (ref 0.0–0.1)
Eosinophils Absolute: 0 10*3/uL (ref 0.0–0.7)
Eosinophils Relative: 0 %
HEMATOCRIT: 36.2 % (ref 36.0–46.0)
HEMOGLOBIN: 12.7 g/dL (ref 12.0–15.0)
LYMPHS ABS: 1 10*3/uL (ref 0.7–4.0)
LYMPHS PCT: 20 %
MCH: 31.8 pg (ref 26.0–34.0)
MCHC: 35.1 g/dL (ref 30.0–36.0)
MCV: 90.7 fL (ref 78.0–100.0)
MONO ABS: 0.3 10*3/uL (ref 0.1–1.0)
Monocytes Relative: 6 %
NEUTROS ABS: 3.4 10*3/uL (ref 1.7–7.7)
NEUTROS PCT: 74 %
Platelets: 278 10*3/uL (ref 150–400)
RBC: 3.99 MIL/uL (ref 3.87–5.11)
RDW: 13.3 % (ref 11.5–15.5)
WBC: 4.7 10*3/uL (ref 4.0–10.5)

## 2016-12-04 MED ORDER — SODIUM CHLORIDE 0.9% FLUSH
INTRAVENOUS | Status: AC
  Administered 2016-12-04: 10 mL
  Filled 2016-12-04: qty 10

## 2016-12-04 MED ORDER — DEXTROSE 5 % IV SOLN
80.0000 mg/m2 | Freq: Once | INTRAVENOUS | Status: AC
  Administered 2016-12-04: 156 mg via INTRAVENOUS
  Filled 2016-12-04: qty 26

## 2016-12-04 MED ORDER — DIPHENHYDRAMINE HCL 50 MG/ML IJ SOLN
50.0000 mg | Freq: Once | INTRAMUSCULAR | Status: AC
  Administered 2016-12-04: 50 mg via INTRAVENOUS
  Filled 2016-12-04: qty 1

## 2016-12-04 MED ORDER — IOPAMIDOL (ISOVUE-300) INJECTION 61%
INTRAVENOUS | Status: AC
  Administered 2016-12-04: 13 mL
  Filled 2016-12-04: qty 50

## 2016-12-04 MED ORDER — HEPARIN SOD (PORK) LOCK FLUSH 100 UNIT/ML IV SOLN
INTRAVENOUS | Status: AC
  Administered 2016-12-04: 500 [IU] via INTRAVENOUS
  Filled 2016-12-04: qty 5

## 2016-12-04 MED ORDER — SODIUM CHLORIDE 0.9% FLUSH
10.0000 mL | INTRAVENOUS | Status: DC | PRN
  Administered 2016-12-04: 10 mL
  Filled 2016-12-04: qty 10

## 2016-12-04 MED ORDER — SODIUM CHLORIDE 0.9 % IJ SOLN
INTRAMUSCULAR | Status: AC
  Filled 2016-12-04: qty 20

## 2016-12-04 MED ORDER — SODIUM CHLORIDE 0.9 % IV SOLN
INTRAVENOUS | Status: AC
  Filled 2016-12-04: qty 50

## 2016-12-04 MED ORDER — SODIUM CHLORIDE 0.9 % IV SOLN
20.0000 mg | Freq: Once | INTRAVENOUS | Status: AC
  Administered 2016-12-04: 20 mg via INTRAVENOUS
  Filled 2016-12-04: qty 2

## 2016-12-04 MED ORDER — FAMOTIDINE IN NACL 20-0.9 MG/50ML-% IV SOLN
20.0000 mg | Freq: Once | INTRAVENOUS | Status: AC
  Administered 2016-12-04: 20 mg via INTRAVENOUS
  Filled 2016-12-04: qty 50

## 2016-12-04 MED ORDER — SODIUM CHLORIDE 0.9 % IV SOLN
Freq: Once | INTRAVENOUS | Status: AC
  Administered 2016-12-04: 12:00:00 via INTRAVENOUS

## 2016-12-04 MED ORDER — HEPARIN SOD (PORK) LOCK FLUSH 100 UNIT/ML IV SOLN
500.0000 [IU] | Freq: Once | INTRAVENOUS | Status: AC | PRN
  Administered 2016-12-04: 500 [IU]
  Filled 2016-12-04: qty 5

## 2016-12-04 NOTE — Patient Instructions (Addendum)
[facility] [facility] at [facility] Discharge Instructions  RECOMMENDATIONS MADE BY THE CONSULTANT AND ANY TEST RESULTS WILL BE SENT TO YOUR REFERRING PHYSICIAN.  You were seen today by Mike Craze. Refer to Dr. Arnoldo Morale for breast surgery RTC 1 month for follow-up after breast surgery.  See Andee Poles at checkout for appointments.  Thank you for choosing [place] at [facility] to provide your oncology and hematology care.  To afford each patient quality time with our provider, please arrive at least 15 minutes before your scheduled appointment time.    If you have a lab appointment with the [facility] please come in thru the  Main Entrance and check in at the main information desk  You need to re-schedule your appointment should you arrive 10 or more minutes late.  We strive to give you quality time with our providers, and arriving late affects you and other patients whose appointments are after yours.  Also, if you no show three or more times for appointments you may be dismissed from the clinic at the providers discretion.     Again, thank you for choosing [facility].  Our hope is that these requests will decrease the amount of time that you wait before being seen by our physicians.       _____________________________________________________________  Should you have questions after your visit to [facility], please contact our office at [phone] between the hours of 8:30 a.m. and 4:30 p.m.  Voicemails left after 4:30 p.m. will not be returned until the following business day.  For prescription refill requests, have your pharmacy contact our office.       Resources For Cancer Patients and their Caregivers ? American Cancer Society: Can assist with transportation, wigs, general needs, runs Look Good Feel Better.        330-032-6235 ? Cancer Care: Provides financial assistance, online support groups,  medication/co-pay assistance.  1-800-813-HOPE 401-585-2015) ? [facility] Assists [place] cancer patients and their families through emotional , educational and financial support.  (262)051-7294 ? [place] DSS Where to apply for food stamps, Medicaid and utility assistance. 202 320 7092 ? RCATS: Transportation to medical appointments. 510-329-4282 ? Social Security Administration: May apply for disability if have a Stage IV cancer. 434 275 4948 334-183-6973 ? [employer], Disability and Transit Services: Assists with nutrition, care and transit needs. [facility] Support Programs: @10RELATIVEDAYS @ > Cancer Support Group  2nd Tuesday of the month 1pm-2pm, Journey Room  > Creative Journey  3rd Tuesday of the month 1130am-1pm, Journey Room  > Look Good Feel Better  1st Wednesday of the month 10am-12 noon, Journey Room (Call [facility] to register [phone])

## 2016-12-04 NOTE — Progress Notes (Signed)
[name] tolerated chemo tx well without complaints or incident. Labs and results of chest fluoroscopy reviewed with Dr. Talbert Cage prior to administering chemotherapy VSS upon discharge. Pt discharged self ambulatory in satisfactory condition accompanied by family member

## 2016-12-04 NOTE — Patient Instructions (Signed)
[facility] Discharge Instructions for Patients Receiving Chemotherapy   Beginning January 23rd 2017 lab work for the Cancer Center will be done in the  Main lab at [facility] on 1st floor. If you have a lab appointment with the Cancer Center please come in thru the  Main Entrance and check in at the main information desk   Today you received the following chemotherapy agents Taxol. Follow-up as scheduled. Call clinic for any questions or concerns  To help prevent nausea and vomiting after your treatment, we encourage you to take your nausea medication   If you develop nausea and vomiting, or diarrhea that is not controlled by your medication, call the clinic.  The clinic phone number is [phone]. Office hours are Monday-Friday 8:30am-5:00pm.  BELOW ARE SYMPTOMS THAT SHOULD BE REPORTED IMMEDIATELY:  *FEVER GREATER THAN 101.0 F  *CHILLS WITH OR WITHOUT FEVER  NAUSEA AND VOMITING THAT IS NOT CONTROLLED WITH YOUR NAUSEA MEDICATION  *UNUSUAL SHORTNESS OF BREATH  *UNUSUAL BRUISING OR BLEEDING  TENDERNESS IN MOUTH AND THROAT WITH OR WITHOUT PRESENCE OF ULCERS  *URINARY PROBLEMS  *BOWEL PROBLEMS  UNUSUAL RASH Items with * indicate a potential emergency and should be followed up as soon as possible. If you have an emergency after office hours please contact your primary care physician or go to the nearest emergency department.  Please call the clinic during office hours if you have any questions or concerns.   You may also contact the Patient Navigator at [phone] should you have any questions or need assistance in obtaining follow up care.      Resources For Cancer Patients and their Caregivers ? American Cancer Society: Can assist with transportation, wigs, general needs, runs Look Good Feel Better.        1-888-227-6333 ? Cancer Care: Provides financial assistance, online support groups, medication/co-pay assistance.  1-800-813-HOPE  (4673) ? [facility] Assists [place] cancer patients and their families through emotional , educational and financial support.  336-427-4357 ? [place] DSS Where to apply for food stamps, Medicaid and utility assistance. 336-342-1394 ? [facility]: Transportation to medical appointments. 336-347-2287 ? Social Security Administration: May apply for disability if have a Stage IV cancer. 336-342-7796 1-800-772-1213 ? [place] Aging, Disability and Transit Services: Assists with nutrition, care and transit needs. 336-349-2343         

## 2016-12-06 NOTE — Progress Notes (Signed)
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    BRIEF ONCOLOGIC HISTORY:    Breast cancer of upper-outer quadrant of right female breast ([facility])   06/24/2016 Mammogram    Diagnostic bilateral mammogram: highly suspicious 3.5 cm mass in the UO R breast with marked enlarged R axillary LN      06/24/2016 Breast US    2.7 x 3.1 x 3.5 cm slightly irregular hypoechoic mass at the 10:00 position of right breast, 6 cm from the nipple.  4.1 x 6 cm right axillar lymph node also identified.      07/01/2016 Pathology Results    Infiltrating ductal carcinoma ER+ 80%, PR-, HER 2-, Ki67 90%      07/01/2016 Initial Biopsy    Ultrasound guided R breast biopsy and R axillary LN biopsy both were clipped post biopsy      07/20/2016 Imaging    MUGA- Upper normal left ventricular ejection fraction of 75% with normal LV wall motion.      07/21/2016 Imaging    CT CAP- 2.9 cm right upper outer quadrant breast mass, consistent with known primary breast carcinoma.  Right axillary and subpectoral lymphadenopathy, consistent with metastatic disease.  Tiny sub-cm bilateral pulmonary nodules are indeterminate, but favor postinflammatory etiology over early metastases. Recommend continued attention on follow-up CT.  No evidence of metastatic disease within the abdomen or pelvis.      07/23/2016 Procedure    Port placed by Dr. Barry Dienes      07/24/2016 - 12/04/2016 Neo-Adjuvant Chemotherapy    Adriamycin/Cytoxan x 4, followed by weekly Taxol x 12       07/30/2016 Imaging    Bone scan: Negative for osseous disease .      09/22/2016 Imaging    CT head- 1. Normal CT  of the brain. No evidence of intracranial metastatic disease. 2. Frothy secretions throughout most of the paranasal sinuses. Correlate clinically for acute sinusitis. This could serve as a source of headache.       INTERVAL HISTORY:  Ms. Berkery returns for follow-up and consideration for her next cycle of chemotherapy.   She is due for cycle #12 Taxol today. Overall, she tells me that she feels pretty well. Energy levels are 75%; appetite 100%. She is excited that today is her last day of chemotherapy and she will "ring the bell" today.    Continues to have some peripheral neuropathy, particularly to her right hand and bilateral feet. The numbness/tingling is intermittent and "worse when my feet are cold. I sleep with a heating pad on my feet and that helps a lot."    She is having some nasal drainage and mild cough. She is wondering if she has a sinus infection. Denies headache. (does report 1 headache this morning when someone in the cancer center lobby had on perfume; this headache has since resolved).  Denies any facial pressure/pain. Denies fever/chills.   Overall, she feels well and feels ready for her last cycle of chemotherapy.   For fun, she enjoys playing different games on her tablet. She also enjoys spending time with her grandchild.    REVIEW OF SYSTEMS:  Review of Systems  Constitutional: Negative.  Negative for chills, fatigue  and fever.  HENT:  Negative.  Negative for lump/mass and nosebleeds.        Intermittent runny nose  Eyes: Negative.   Respiratory: Positive for cough. Negative for shortness of breath.   Cardiovascular: Negative.  Negative for chest pain and leg swelling.  Gastrointestinal: Negative.  Negative for abdominal pain, blood in stool, constipation, diarrhea, nausea and vomiting.  Endocrine: Negative.   Genitourinary: Negative.  Negative for dysuria and hematuria.   Musculoskeletal: Negative.  Negative for arthralgias.  Skin: Negative.  Negative for  rash.  Neurological: Positive for numbness. Negative for dizziness and headaches.  Hematological: Negative.  Negative for adenopathy. Does not bruise/bleed easily.  Psychiatric/Behavioral: Negative.  Negative for depression and sleep disturbance. The patient is not nervous/anxious.      PAST MEDICAL/SURGICAL HISTORY:  Past Medical History:  Diagnosis Date  . Arthritis   . Breast cancer of upper-outer quadrant of right female breast ([facility]) 07/14/2016  . Cancer ([facility])   . Cough    dry  . Diabetes mellitus without complication ([facility])   . Hypertension   . Ruptured eardrum    left    Past Surgical History:  Procedure Laterality Date  . ABDOMINAL HYSTERECTOMY     with bilateral bso  . COLONOSCOPY    . PORTACATH PLACEMENT Left 07/23/2016   Procedure: INSERTION PORT-A-CATH;  Surgeon: Stark Klein, MD;  Location: [facility];  Service: General;  Laterality: Left;     SOCIAL HISTORY:  Social History   Social History  . Marital status: Single    Spouse name: N/A  . Number of children: N/A  . Years of education: N/A   Occupational History  . Not on file.   Social History Main Topics  . Smoking status: Former Smoker    Types: Cigarettes  . Smokeless tobacco: Never Used  . Alcohol use No  . Drug use: No  . Sexual activity: Not on file   Other Topics Concern  . Not on file   Social History Narrative  . No narrative on file    FAMILY HISTORY:  History reviewed. No pertinent family history.  CURRENT MEDICATIONS:  Outpatient Encounter Prescriptions as of 12/04/2016  Medication Sig Note  . ALPRAZolam (XANAX) 0.5 MG tablet Take 1 tablet (0.5 mg total) by mouth 3 (three) times daily as needed for anxiety.   [name] amLODipine (NORVASC) 10 MG tablet Take 10 mg by mouth daily.   [name] amoxicillin-clavulanate (AUGMENTIN) 875-125 MG tablet Take 1 tablet by mouth 2 (two) times daily.   [name] aspirin EC 81 MG tablet Take 81 mg by mouth daily.   [name] atorvastatin (LIPITOR) 10 MG tablet  Take 10 mg by mouth every evening.   . glyBURIDE-metformin (GLUCOVANCE) 5-500 MG tablet Take 1 tablet by mouth 2 (two) times daily.   . hydrochlorothiazide (HYDRODIURIL) 25 MG tablet Take 25 mg by mouth daily.   [name] ibuprofen (ADVIL,MOTRIN) 800 MG tablet Take 800 mg by mouth every 8 (eight) hours as needed.   . lidocaine-prilocaine (EMLA) cream Apply a quarter size amount to affected area 1 hour prior to coming to chemotherapy.   [name] lisinopril (PRINIVIL,ZESTRIL) 40 MG tablet Take 40 mg by mouth daily.   . ondansetron (ZOFRAN) 8 MG tablet Take 1 tablet (8 mg total) by mouth every 8 (eight) hours as needed for nausea or vomiting. 07/20/2016: Have not started med yet  . oxyCODONE (OXY IR/ROXICODONE) 5 MG immediate release tablet Take 1-2 tablets (5-10 mg total) by mouth every 6 (  six) hours as needed for moderate pain, severe pain or breakthrough pain.   . potassium chloride 20 MEQ/15ML (10%) SOLN Take 15 mLs (20 mEq total) by mouth 2 (two) times daily.   . prochlorperazine (COMPAZINE) 10 MG tablet Take 1 tablet (10 mg total) by mouth every 6 (six) hours as needed for nausea or vomiting. 07/20/2016: Have not started med yet   . temazepam (RESTORIL) 30 MG capsule     No facility-administered encounter medications on file as of 12/04/2016.     ALLERGIES:  No Known Allergies   PHYSICAL EXAM:  ECOG Performance status: 0-1 - Symptomatic, but independent.      Physical Exam  Constitutional: She is oriented to person, place, and time and well-developed, well-nourished, and in no distress.  Patient seen in chemo chair in infusion area   HENT:  Head: Normocephalic.  Mouth/Throat: Oropharynx is clear and moist.  Eyes: Conjunctivae are normal. No scleral icterus.  Neck: Normal range of motion. Neck supple.  Cardiovascular: Normal rate and regular rhythm.   Pulmonary/Chest: Effort normal and breath sounds normal. No respiratory distress. She has no wheezes. She has no rales.  Abdominal: Soft. Bowel  sounds are normal. There is no tenderness.  Musculoskeletal: Normal range of motion. She exhibits no edema.  Lymphadenopathy:    She has no cervical adenopathy.       Right: No supraclavicular adenopathy present.       Left: No supraclavicular adenopathy present.  Neurological: She is alert and oriented to person, place, and time. No cranial nerve deficit. Gait normal.  Skin: Skin is warm and dry. No rash noted.  Psychiatric: Mood, memory, affect and judgment normal.  Nursing note and vitals reviewed.    LABORATORY DATA:  I have reviewed the labs as listed.  CBC    Component Value Date/Time   WBC 4.7 12/04/2016 1050   RBC 3.99 12/04/2016 1050   HGB 12.7 12/04/2016 1050   HCT 36.2 12/04/2016 1050   PLT 278 12/04/2016 1050   MCV 90.7 12/04/2016 1050   MCH 31.8 12/04/2016 1050   MCHC 35.1 12/04/2016 1050   RDW 13.3 12/04/2016 1050   LYMPHSABS 1.0 12/04/2016 1050   MONOABS 0.3 12/04/2016 1050   EOSABS 0.0 12/04/2016 1050   BASOSABS 0.0 12/04/2016 1050   CMP Latest Ref Rng & Units 12/04/2016 11/27/2016 11/20/2016  Glucose 65 - 99 mg/dL 295(H) 374(H) 319(H)  BUN 6 - 20 mg/dL _0 Creatinine 0.44 - 1.00 mg/dL 0.83 1.01(H) 0.89  Sodium 135 - 145 mmol/L 136 131(L) 134(L)  Potassium 3.5 - 5.1 mmol/L 3.5 3.8 3.6  Chloride 101 - 111 mmol/L 102 97(L) 102  CO2 22 - 32 mmol/L _1 Calcium 8.9 - 10.3 mg/dL 9.7 9.9 9.9  Total Protein 6.5 - 8.1 g/dL 7.0 6.9 6.5  Total Bilirubin 0.3 - 1.2 mg/dL 0.7 0.9 0.8  Alkaline Phos 38 - 126 U/L 79 85 82  AST 15 - 41 U/L _2 ALT 14 - 54 U/L 43 29 31    PENDING LABS:    DIAGNOSTIC IMAGING:  Port-a-cath dye study: 09/04/16     CT brain: 09/21/16    PATHOLOGY:  Right breast and axillary LN biopsy: 07/01/16        ASSESSMENT & PLAN:   Stage IIB (T2N1M0) invasive ductal carcinoma, ER+/PR-/HER2-: -Currently undergoing neoadjuvant chemotherapy; s/p Adriamycin/Cytoxan x 4 cycles; will complete Taxol x 12 cycles today.    -Labs reviewed and are adequate  for treatment today.  -Discussed her treatment plan including definitive breast surgery (likely lumpectomy), followed by radiation therapy, then anti-estrogen therapy. Discussed her options in terms of locations for both her breast surgery and radiation therapy. She elects to have her breast surgery done in [place], as driving to [facility] "is too far for me."  Referral placed to Dr. Arnoldo Morale for surgical consideration.  For radiation, she would like to be treated in [place] at [facility].  We will place this referral after her surgery.  -Return to cancer center in about 6 weeks for follow-up visit to review surgical path and finalize treatment plan (referral to [facility] and anti-estrogen therapy).   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  -Grade 1-2; does not interfere with ADLs. Proceed with last dose of chemo as scheduled.    Runny nose/Cough:  -Her symptoms seem more consistent with allergies and post-nasal drip rather than a sinus infection. Shared with her that I would prefer to treat conservatively with OTC allergy medications like Flonase and Zyrtec to see if her symptoms improve. If conservative measures are not effective, then could consider antibiotics.       Dispo:  -Complete neoadjuvant chemotherapy today.  -Referral to Dr. Arnoldo Morale for breast surgery consultation.  -Return to cancer center in 6 weeks for follow-up, to review surgical path, and finalize treatment plan (likely refer for XRT).     All questions were answered to patient's stated satisfaction. Encouraged patient to call with any new concerns or questions before her next visit to the cancer center and we can certain see her sooner, if needed.    Plan of care discussed with Dr. Talbert Cage, who agrees with the above aforementioned.    Orders placed this encounter:  No orders of the defined types were placed in this encounter.     Mike Craze, NP [facility] [phone]

## 2016-12-15 ENCOUNTER — Encounter: Payer: Self-pay | Admitting: General Surgery

## 2016-12-15 ENCOUNTER — Ambulatory Visit (INDEPENDENT_AMBULATORY_CARE_PROVIDER_SITE_OTHER): Payer: No Typology Code available for payment source | Admitting: General Surgery

## 2016-12-15 VITALS — BP 144/86 | HR 113 | Temp 96.9°F | Resp 18 | Ht 64.0 in | Wt 180.0 lb

## 2016-12-15 DIAGNOSIS — C50411 Malignant neoplasm of upper-outer quadrant of right female breast: Secondary | ICD-10-CM | POA: Diagnosis not present

## 2016-12-15 NOTE — Patient Instructions (Signed)
Partial Mastectomy With or Without Axillary Lymph Node Removal Partial mastectomy with or without axillary lymph node removal is a surgery to remove breast cancer. It is a type of breast-conserving surgery. This means that the cancerous tissue is removed but the breast remains intact. During this procedure, the tumor and a small rim of healthy tissue surrounding it will be removed. Lymph nodes under your arm may also be removed and tested to find out if the cancer has spread. Let your health care provider know about:  Any allergies you have.  All medicines you are taking, including vitamins, herbs, eye drops, creams, and over-the-counter medicines.  Previous problems you or members of your family have had with the use of anesthetics.  Any blood disorders you have.  Any surgeries you have had.  Any medical conditions you have. What are the risks? Generally, this is a safe procedure. However, problems may occur, including:  A change in the way your breast looks and feels.  Breast pain.  Infection.  Bleeding.  Pain, swelling, weakness, or numbness in the arm on the side of your surgery.  What happens before the procedure?  Ask your health care provider about: ? Changing or stopping your regular medicines. This is especially important if you are taking diabetes medicines or blood thinners. ? Taking medicines such as aspirin and ibuprofen. These medicines can thin your blood. Do not take these medicines before your procedure if your health care provider instructs you not to.  Follow your health care provider's instructions about eating or drinking restrictions.  You may be checked for extra fluid around your lymph nodes (lymphedema). What happens during the procedure?  An IV tube will be inserted into one of your veins.  You will be given a medicine that makes you fall asleep (general anesthetic).  Your surgeon may Danuel Felicetti your breast to indicate the location of your tumor and to  plan the incision.  Your breast will be cleaned with a germ-killing solution (antiseptic).  Your surgeon will make an incision over the area of your breast where the tumor is located. This will usually be a curved incision that follows the normal shape of your breast.  The tumor will be removed along with a portion of the tissue that surrounds it.  If the tumor is close to the muscles over your chest, some muscle tissue may also be removed.  The incision may extend to the lymph nodes under your arm, or a second incision may be made under your arm.  Lymph nodes under your arm may be removed.  You may have a drainage tube inserted into your incision to collect fluid that builds up after surgery. This tube will be connected to a suction bulb.  Your incision or incisions will be closed with stitches (sutures).  A bandage (dressing) will be placed over your breast and under your arm. The procedure may vary among health care providers and hospitals. What happens after the procedure?  Your blood pressure, heart rate, breathing rate, and blood oxygen level will be monitored often until the medicines you were given have worn off.  You will be given pain medicine as needed.  You will be encouraged to get up and walk as soon as you can.  Your IV tube will be removed when you are able to eat and drink.  Your drain may be removed before you go home from the hospital, or you may be sent home with your drain and suction bulb. This information is   Your IV tube will be removed when you are able to eat and drink.   Your drain may be removed before you go home from the hospital, or you may be sent home with your drain and suction bulb.  This information is not intended to replace advice given to you by your health care provider. Make sure you discuss any questions you have with your health care provider.  Document Released: 11/06/2014 Document Revised: 02/27/2016 Document Reviewed: 03/07/2014  Elsevier Interactive Patient Education  2018 Elsevier Inc.

## 2016-12-15 NOTE — Progress Notes (Signed)
[name]; [id number]; [birth year]   HPI   Patient is a 61 year old black female who is undergone neoadjuvant therapy for her right breast cancer.  She does also have axillary nodal involvement.  She was referred by oncology for further evaluation and treatment.  She has no pain at the present time.  She has tolerated neoadjuvant therapy well. Past Medical History:  Diagnosis Date  . Arthritis   . Breast cancer of upper-outer quadrant of right female breast ([facility]) 07/14/2016  . Cancer ([facility])   . Cough    dry  . Diabetes mellitus without complication ([facility])   . Hypertension   . Ruptured eardrum    left     Past Surgical History:  Procedure Laterality Date  . ABDOMINAL HYSTERECTOMY     with bilateral bso  . COLONOSCOPY    . PORTACATH PLACEMENT Left 07/23/2016   Procedure: INSERTION PORT-A-CATH;  Surgeon: Stark Klein, MD;  Location: [facility];  Service: General;  Laterality: Left;    No family history on file.  Current Outpatient Prescriptions on File Prior to Visit  Medication Sig Dispense Refill  . ALPRAZolam (XANAX) 0.5 MG tablet Take 1 tablet (0.5 mg total) by mouth 3 (three) times daily as needed for anxiety. 60 tablet 0  . amLODipine (NORVASC) 10 MG tablet Take 10 mg by mouth daily.    [name] amoxicillin-clavulanate (AUGMENTIN) 875-125 MG tablet Take 1 tablet by mouth 2 (two) times daily. 20 tablet 0  . aspirin EC 81 MG tablet Take 81 mg by mouth daily.    [name] atorvastatin (LIPITOR) 10 MG tablet Take 10 mg by mouth every evening.  4  . glyBURIDE-metformin (GLUCOVANCE) 5-500 MG tablet Take 1 tablet by mouth 2 (two) times daily.  4  . hydrochlorothiazide (HYDRODIURIL) 25 MG tablet Take 25 mg by mouth daily.    [name] ibuprofen (ADVIL,MOTRIN) 800 MG tablet Take 800 mg by mouth every 8 (eight) hours as needed.  0  . lidocaine-prilocaine (EMLA) cream Apply a quarter size amount to affected area 1 hour prior to coming to chemotherapy. 30 g 2  . lisinopril (PRINIVIL,ZESTRIL) 40  MG tablet Take 40 mg by mouth daily.    . ondansetron (ZOFRAN) 8 MG tablet Take 1 tablet (8 mg total) by mouth every 8 (eight) hours as needed for nausea or vomiting. 30 tablet 2  . oxyCODONE (OXY IR/ROXICODONE) 5 MG immediate release tablet Take 1-2 tablets (5-10 mg total) by mouth every 6 (six) hours as needed for moderate pain, severe pain or breakthrough pain. 30 tablet 0  . potassium chloride 20 MEQ/15ML (10%) SOLN Take 15 mLs (20 mEq total) by mouth 2 (two) times daily. 473 mL 1  . prochlorperazine (COMPAZINE) 10 MG tablet Take 1 tablet (10 mg total) by mouth every 6 (six) hours as needed for nausea or vomiting. 30 tablet 2  . temazepam (RESTORIL) 30 MG capsule      No current facility-administered medications on file prior to visit.     No Known Allergies  History  Alcohol Use No    History  Smoking Status  . Former Smoker  . Types: Cigarettes  Smokeless Tobacco  . Never Used    Review of Systems  Constitutional: Negative.   HENT: Positive for sinus pain.   Eyes: Negative.   Respiratory: Negative.   Cardiovascular: Negative.   Gastrointestinal: Negative.   Genitourinary: Negative.   Musculoskeletal: Negative.   Skin: Negative.   Neurological: Negative.   Endo/Heme/Allergies: Negative.  Psychiatric/Behavioral: Negative.     Objective   Vitals:   12/15/16 1538  BP: (!) 144/86  Pulse: (!) 113  Resp: 18  Temp: (!) 96.9 F (36.1 C)    Physical Exam  Constitutional: She is oriented to person, place, and time and well-developed, well-nourished, and in no distress.  HENT:  Head: Normocephalic and atraumatic.  Neck: Normal range of motion. Neck supple.  Cardiovascular: Normal rate, regular rhythm and normal heart sounds.   No murmur heard. Pulmonary/Chest: Effort normal and breath sounds normal. She has no wheezes.  Lymphadenopathy:    She has no cervical adenopathy.  Neurological: She is alert and oriented to person, place, and time.  Skin: Skin is warm  and dry.  Vitals reviewed.    Physical examination reveals no dominant mass, nipple discharge from her dimpling.  I did not palpate a lymph node in the axilla.  Left breast examination reveals no dominant mass, nipple discharge, dimpling.  The axilla is negative for palpable nodes.  Dr. Laverle Patter notes reviewed.  Assessment    Right breast cancer with nodal involvement, status post neoadjuvant therapy Plan    patient would like to proceed with breast conservative therapy.  Patient scheduled for a right partial mastectomy after needle localization, axillary dissection on 12/30/2016.  Risks and benefits of the procedure including bleeding, infection, nerve injury, the possibility of involved margins were fully explained to the patient, who gave informed consent.

## 2016-12-17 ENCOUNTER — Other Ambulatory Visit: Payer: Self-pay | Admitting: General Surgery

## 2016-12-17 DIAGNOSIS — C50911 Malignant neoplasm of unspecified site of right female breast: Secondary | ICD-10-CM

## 2016-12-21 NOTE — H&P (Signed)
[name]; [id number]; [birth year]   HPI   Patient is a 61 year old black female who is undergone neoadjuvant therapy for her right breast cancer.  She does also have axillary nodal involvement.  She was referred by oncology for further evaluation and treatment.  She has no pain at the present time.  She has tolerated neoadjuvant therapy well.     Past Medical History:  Diagnosis Date  . Arthritis   . Breast cancer of upper-outer quadrant of right female breast ([place]) 07/14/2016  . Cancer ([place])   . Cough    dry  . Diabetes mellitus without complication ([place])   . Hypertension   . Ruptured eardrum    left          Past Surgical History:  Procedure Laterality Date  . ABDOMINAL HYSTERECTOMY     with bilateral bso  . COLONOSCOPY    . PORTACATH PLACEMENT Left 07/23/2016   Procedure: INSERTION PORT-A-CATH;  Surgeon: Stark Klein, MD;  Location: [facility];  Service: General;  Laterality: Left;    No family history on file.        Current Outpatient Prescriptions on File Prior to Visit  Medication Sig Dispense Refill  . ALPRAZolam (XANAX) 0.5 MG tablet Take 1 tablet (0.5 mg total) by mouth 3 (three) times daily as needed for anxiety. 60 tablet 0  . amLODipine (NORVASC) 10 MG tablet Take 10 mg by mouth daily.    [name] amoxicillin-clavulanate (AUGMENTIN) 875-125 MG tablet Take 1 tablet by mouth 2 (two) times daily. 20 tablet 0  . aspirin EC 81 MG tablet Take 81 mg by mouth daily.    [name] atorvastatin (LIPITOR) 10 MG tablet Take 10 mg by mouth every evening.  4  . glyBURIDE-metformin (GLUCOVANCE) 5-500 MG tablet Take 1 tablet by mouth 2 (two) times daily.  4  . hydrochlorothiazide (HYDRODIURIL) 25 MG tablet Take 25 mg by mouth daily.    [name] ibuprofen (ADVIL,MOTRIN) 800 MG tablet Take 800 mg by mouth every 8 (eight) hours as needed.  0  . lidocaine-prilocaine (EMLA) cream Apply a quarter size amount to affected area 1 hour prior to coming to  chemotherapy. 30 g 2  . lisinopril (PRINIVIL,ZESTRIL) 40 MG tablet Take 40 mg by mouth daily.    . ondansetron (ZOFRAN) 8 MG tablet Take 1 tablet (8 mg total) by mouth every 8 (eight) hours as needed for nausea or vomiting. 30 tablet 2  . oxyCODONE (OXY IR/ROXICODONE) 5 MG immediate release tablet Take 1-2 tablets (5-10 mg total) by mouth every 6 (six) hours as needed for moderate pain, severe pain or breakthrough pain. 30 tablet 0  . potassium chloride 20 MEQ/15ML (10%) SOLN Take 15 mLs (20 mEq total) by mouth 2 (two) times daily. 473 mL 1  . prochlorperazine (COMPAZINE) 10 MG tablet Take 1 tablet (10 mg total) by mouth every 6 (six) hours as needed for nausea or vomiting. 30 tablet 2  . temazepam (RESTORIL) 30 MG capsule      No current facility-administered medications on file prior to visit.     No Known Allergies     History  Alcohol Use No        History  Smoking Status  . Former Smoker  . Types: Cigarettes  Smokeless Tobacco  . Never Used    Review of Systems  Constitutional: Negative.   HENT: Positive for sinus pain.   Eyes: Negative.   Respiratory: Negative.   Cardiovascular: Negative.   Gastrointestinal: Negative.  Genitourinary: Negative.   Musculoskeletal: Negative.   Skin: Negative.   Neurological: Negative.   Endo/Heme/Allergies: Negative.   Psychiatric/Behavioral: Negative.     Objective      Vitals:   12/15/16 1538  BP: (!) 144/86  Pulse: (!) 113  Resp: 18  Temp: (!) 96.9 F (36.1 C)    Physical Exam  Constitutional: She is oriented to person, place, and time and well-developed, well-nourished, and in no distress.  HENT:  Head: Normocephalic and atraumatic.  Neck: Normal range of motion. Neck supple.  Cardiovascular: Normal rate, regular rhythm and normal heart sounds.   No murmur heard. Pulmonary/Chest: Effort normal and breath sounds normal. She has no wheezes.  Lymphadenopathy:    She has no cervical adenopathy.   Neurological: She is alert and oriented to person, place, and time.  Skin: Skin is warm and dry.  Vitals reviewed.    Physical examination reveals no dominant mass, nipple discharge from her dimpling.  I did not palpate a lymph node in the axilla.  Left breast examination reveals no dominant mass, nipple discharge, dimpling.  The axilla is negative for palpable nodes.  Dr. Laverle Patter notes reviewed.  Assessment    Right breast cancer with nodal involvement, status post neoadjuvant therapy Plan    patient would like to proceed with breast conservative therapy.  Patient scheduled for a right partial mastectomy after needle localization, axillary dissection on 12/30/2016.  Risks and benefits of the procedure including bleeding, infection, nerve injury, the possibility of involved margins were fully explained to the patient, who gave informed consent.

## 2016-12-23 NOTE — Patient Instructions (Addendum)
[name]  12/23/2016     @PREFPERIOPPHARMACY @   Your procedure is scheduled on 12/30/2016.  Report to [facility] at 7:30 A.M., as needle localization is scheduled for 8:00 A.M.  Call this number if you have problems the morning of surgery:  [phone]   Remember:  Do not eat food or drink liquids after midnight.  Take these medicines the morning of surgery with A SIP OF WATER Xanax, Amlodipine, Oxy IR if needed, Compazine if needed  DO NOT TAKE GLYBURIDE FOR DIABETES MORNING OF SURGERY   Do not wear jewelry, make-up or nail polish.  Do not wear lotions, powders, or perfumes, or deoderant.  Do not shave 48 hours prior to surgery.  Men may shave face and neck.  Do not bring valuables to the hospital.  [facility] is not responsible for any belongings or valuables.  Contacts, dentures or bridgework may not be worn into surgery.  Leave your suitcase in the car.  After surgery it may be brought to your room.  For patients admitted to the hospital, discharge time will be determined by your treatment team.  Patients discharged the day of surgery will not be allowed to drive home.    Please read over the following fact sheets that you were given. Surgical Site Infection Prevention and Anesthesia Post-op Instructions     PATIENT INSTRUCTIONS POST-ANESTHESIA  IMMEDIATELY FOLLOWING SURGERY:  Do not drive or operate machinery for the first twenty four hours after surgery.  Do not make any important decisions for twenty four hours after surgery or while taking narcotic pain medications or sedatives.  If you develop intractable nausea and vomiting or a severe headache please notify your doctor immediately.  FOLLOW-UP:  Please make an appointment with your surgeon as instructed. You do not need to follow up with anesthesia unless specifically instructed to do so.  WOUND CARE INSTRUCTIONS (if applicable):  Keep a dry clean dressing on the anesthesia/puncture wound site if there  is drainage.  Once the wound has quit draining you may leave it open to air.  Generally you should leave the bandage intact for twenty four hours unless there is drainage.  If the epidural site drains for more than 36-48 hours please call the anesthesia department.  QUESTIONS?:  Please feel free to call your physician or the hospital operator if you have any questions, and they will be happy to assist you.      Partial Mastectomy With or Without Axillary Lymph Node Removal Partial mastectomy with or without axillary lymph node removal is a surgery to remove breast cancer. It is a type of breast-conserving surgery. This means that the cancerous tissue is removed but the breast remains intact. During this procedure, the tumor and a small rim of healthy tissue surrounding it will be removed. Lymph nodes under your arm may also be removed and tested to find out if the cancer has spread. Let your health care provider know about:  Any allergies you have.  All medicines you are taking, including vitamins, herbs, eye drops, creams, and over-the-counter medicines.  Previous problems you or members of your family have had with the use of anesthetics.  Any blood disorders you have.  Any surgeries you have had.  Any medical conditions you have. What are the risks? Generally, this is a safe procedure. However, problems may occur, including:  A change in the way your breast looks and feels.  Breast pain.  Infection.  Bleeding.  Pain, swelling, weakness,  or numbness in the arm on the side of your surgery.  What happens before the procedure?  Ask your health care provider about: ? Changing or stopping your regular medicines. This is especially important if you are taking diabetes medicines or blood thinners. ? Taking medicines such as aspirin and ibuprofen. These medicines can thin your blood. Do not take these medicines before your procedure if your health care provider instructs you not  to.  Follow your health care provider's instructions about eating or drinking restrictions.  You may be checked for extra fluid around your lymph nodes (lymphedema). What happens during the procedure?  An IV tube will be inserted into one of your veins.  You will be given a medicine that makes you fall asleep (general anesthetic).  Your surgeon may mark your breast to indicate the location of your tumor and to plan the incision.  Your breast will be cleaned with a germ-killing solution (antiseptic).  Your surgeon will make an incision over the area of your breast where the tumor is located. This will usually be a curved incision that follows the normal shape of your breast.  The tumor will be removed along with a portion of the tissue that surrounds it.  If the tumor is close to the muscles over your chest, some muscle tissue may also be removed.  The incision may extend to the lymph nodes under your arm, or a second incision may be made under your arm.  Lymph nodes under your arm may be removed.  You may have a drainage tube inserted into your incision to collect fluid that builds up after surgery. This tube will be connected to a suction bulb.  Your incision or incisions will be closed with stitches (sutures).  A bandage (dressing) will be placed over your breast and under your arm. The procedure may vary among health care providers and hospitals. What happens after the procedure?  Your blood pressure, heart rate, breathing rate, and blood oxygen level will be monitored often until the medicines you were given have worn off.  You will be given pain medicine as needed.  You will be encouraged to get up and walk as soon as you can.  Your IV tube will be removed when you are able to eat and drink.  Your drain may be removed before you go home from the hospital, or you may be sent home with your drain and suction bulb. This information is not intended to replace advice given  to you by your health care provider. Make sure you discuss any questions you have with your health care provider. Document Released: 11/06/2014 Document Revised: 02/27/2016 Document Reviewed: 03/07/2014 Elsevier Interactive Patient Education  2018 [employer].

## 2016-12-25 ENCOUNTER — Encounter (HOSPITAL_COMMUNITY)
Admission: RE | Admit: 2016-12-25 | Discharge: 2016-12-25 | Disposition: A | Discharge status: Home / Self Care | Payer: No Typology Code available for payment source | Source: Ambulatory Visit | Attending: General Surgery | Admitting: General Surgery

## 2016-12-25 ENCOUNTER — Ambulatory Visit (HOSPITAL_COMMUNITY)
Admission: RE | Admit: 2016-12-25 | Discharge: 2016-12-25 | Disposition: A | Discharge status: Home / Self Care | Payer: No Typology Code available for payment source | Source: Ambulatory Visit | Attending: General Surgery | Admitting: General Surgery

## 2016-12-25 ENCOUNTER — Encounter (HOSPITAL_COMMUNITY): Payer: Self-pay

## 2016-12-25 DIAGNOSIS — C50911 Malignant neoplasm of unspecified site of right female breast: Secondary | ICD-10-CM | POA: Diagnosis not present

## 2016-12-25 DIAGNOSIS — I700 Atherosclerosis of aorta: Secondary | ICD-10-CM | POA: Insufficient documentation

## 2016-12-25 LAB — CBC WITH DIFFERENTIAL/PLATELET
Basophils Absolute: 0 10*3/uL (ref 0.0–0.1)
Basophils Relative: 0 %
EOS ABS: 0.1 10*3/uL (ref 0.0–0.7)
Eosinophils Relative: 1 %
HCT: 38.2 % (ref 36.0–46.0)
HEMOGLOBIN: 13.3 g/dL (ref 12.0–15.0)
Lymphocytes Relative: 30 %
Lymphs Abs: 1.9 10*3/uL (ref 0.7–4.0)
MCH: 31.1 pg (ref 26.0–34.0)
MCHC: 34.8 g/dL (ref 30.0–36.0)
MCV: 89.5 fL (ref 78.0–100.0)
MONOS PCT: 7 %
Monocytes Absolute: 0.4 10*3/uL (ref 0.1–1.0)
NEUTROS PCT: 62 %
Neutro Abs: 3.9 10*3/uL (ref 1.7–7.7)
Platelets: 261 10*3/uL (ref 150–400)
RBC: 4.27 MIL/uL (ref 3.87–5.11)
RDW: 12.8 % (ref 11.5–15.5)
WBC: 6.3 10*3/uL (ref 4.0–10.5)

## 2016-12-25 LAB — COMPREHENSIVE METABOLIC PANEL
ALBUMIN: 3.9 g/dL (ref 3.5–5.0)
ALK PHOS: 90 U/L (ref 38–126)
ALT: 33 U/L (ref 14–54)
ANION GAP: 14 (ref 5–15)
AST: 26 U/L (ref 15–41)
BUN: 12 mg/dL (ref 6–20)
CO2: 23 mmol/L (ref 22–32)
Calcium: 10.3 mg/dL (ref 8.9–10.3)
Chloride: 102 mmol/L (ref 101–111)
Creatinine, Ser: 0.91 mg/dL (ref 0.44–1.00)
GFR calc Af Amer: >60 mL/min (ref >60)
GFR calc non Af Amer: >60 mL/min (ref >60)
GLUCOSE: 245 mg/dL — AB (ref 65–99)
POTASSIUM: 3.2 mmol/L — AB (ref 3.5–5.1)
SODIUM: 139 mmol/L (ref 135–145)
Total Bilirubin: 0.6 mg/dL (ref 0.3–1.2)
Total Protein: 7 g/dL (ref 6.5–8.1)

## 2016-12-28 NOTE — Pre-Procedure Instructions (Signed)
Dr Arnoldo Morale aware of K+. No orders given.

## 2016-12-30 ENCOUNTER — Ambulatory Visit (HOSPITAL_COMMUNITY): Payer: No Typology Code available for payment source | Admitting: Anesthesiology

## 2016-12-30 ENCOUNTER — Other Ambulatory Visit: Payer: Self-pay | Admitting: General Surgery

## 2016-12-30 ENCOUNTER — Encounter (HOSPITAL_COMMUNITY): Payer: Self-pay

## 2016-12-30 ENCOUNTER — Ambulatory Visit (HOSPITAL_COMMUNITY)
Admission: RE | Admit: 2016-12-30 | Discharge: 2016-12-30 | Disposition: A | Discharge status: Home / Self Care | Payer: No Typology Code available for payment source | Source: Ambulatory Visit | Attending: General Surgery | Admitting: General Surgery

## 2016-12-30 ENCOUNTER — Encounter (HOSPITAL_COMMUNITY)
Admission: RE | Disposition: A | Discharge status: Home / Self Care | Payer: Self-pay | Source: Ambulatory Visit | Attending: General Surgery

## 2016-12-30 DIAGNOSIS — E119 Type 2 diabetes mellitus without complications: Secondary | ICD-10-CM | POA: Insufficient documentation

## 2016-12-30 DIAGNOSIS — M1990 Unspecified osteoarthritis, unspecified site: Secondary | ICD-10-CM | POA: Diagnosis not present

## 2016-12-30 DIAGNOSIS — C50911 Malignant neoplasm of unspecified site of right female breast: Secondary | ICD-10-CM | POA: Diagnosis present

## 2016-12-30 DIAGNOSIS — N6310 Unspecified lump in the right breast, unspecified quadrant: Secondary | ICD-10-CM

## 2016-12-30 DIAGNOSIS — Z7982 Long term (current) use of aspirin: Secondary | ICD-10-CM | POA: Insufficient documentation

## 2016-12-30 DIAGNOSIS — Z79899 Other long term (current) drug therapy: Secondary | ICD-10-CM | POA: Insufficient documentation

## 2016-12-30 DIAGNOSIS — Z87891 Personal history of nicotine dependence: Secondary | ICD-10-CM | POA: Diagnosis not present

## 2016-12-30 DIAGNOSIS — I10 Essential (primary) hypertension: Secondary | ICD-10-CM | POA: Insufficient documentation

## 2016-12-30 DIAGNOSIS — Z7984 Long term (current) use of oral hypoglycemic drugs: Secondary | ICD-10-CM | POA: Insufficient documentation

## 2016-12-30 DIAGNOSIS — C50411 Malignant neoplasm of upper-outer quadrant of right female breast: Secondary | ICD-10-CM

## 2016-12-30 HISTORY — PX: AXILLARY LYMPH NODE DISSECTION: SHX5229

## 2016-12-30 HISTORY — PX: PARTIAL MASTECTOMY WITH NEEDLE LOCALIZATION: SHX6008

## 2016-12-30 LAB — GLUCOSE, CAPILLARY
GLUCOSE-CAPILLARY: 274 mg/dL — AB (ref 65–99)
GLUCOSE-CAPILLARY: 168 mg/dL — AB (ref 65–99)
Glucose-Capillary: 267 mg/dL — ABNORMAL HIGH (ref 65–99)

## 2016-12-30 SURGERY — PARTIAL MASTECTOMY WITH NEEDLE LOCALIZATION
Anesthesia: General | Laterality: Right

## 2016-12-30 MED ORDER — HEMOSTATIC AGENTS (NO CHARGE) OPTIME
TOPICAL | Status: DC | PRN
  Administered 2016-12-30: 1 via TOPICAL

## 2016-12-30 MED ORDER — PROPOFOL 10 MG/ML IV BOLUS
INTRAVENOUS | Status: AC
  Filled 2016-12-30: qty 20

## 2016-12-30 MED ORDER — CEFAZOLIN SODIUM-DEXTROSE 2-4 GM/100ML-% IV SOLN
2.0000 g | INTRAVENOUS | Status: AC
  Administered 2016-12-30: 2 g via INTRAVENOUS
  Filled 2016-12-30: qty 100

## 2016-12-30 MED ORDER — FENTANYL CITRATE (PF) 100 MCG/2ML IJ SOLN
INTRAMUSCULAR | Status: DC | PRN
  Administered 2016-12-30: 75 ug via INTRAVENOUS
  Administered 2016-12-30: 25 ug via INTRAVENOUS
  Administered 2016-12-30: 50 ug via INTRAVENOUS

## 2016-12-30 MED ORDER — LACTATED RINGERS IV SOLN
INTRAVENOUS | Status: DC
  Administered 2016-12-30: 10:00:00 via INTRAVENOUS

## 2016-12-30 MED ORDER — KETOROLAC TROMETHAMINE 30 MG/ML IJ SOLN
30.0000 mg | Freq: Once | INTRAMUSCULAR | Status: AC
  Administered 2016-12-30: 30 mg via INTRAVENOUS
  Filled 2016-12-30: qty 1

## 2016-12-30 MED ORDER — LIDOCAINE HCL (PF) 2 % IJ SOLN
INTRAMUSCULAR | Status: AC
  Filled 2016-12-30: qty 10

## 2016-12-30 MED ORDER — ROCURONIUM BROMIDE 50 MG/5ML IV SOLN
INTRAVENOUS | Status: AC
  Filled 2016-12-30: qty 1

## 2016-12-30 MED ORDER — MIDAZOLAM HCL 5 MG/5ML IJ SOLN
INTRAMUSCULAR | Status: DC | PRN
  Administered 2016-12-30: 2 mg via INTRAVENOUS

## 2016-12-30 MED ORDER — OXYCODONE-ACETAMINOPHEN 7.5-325 MG PO TABS: 1.0000 | ORAL_TABLET | ORAL | 0 refills | Status: DC | PRN

## 2016-12-30 MED ORDER — CHLORHEXIDINE GLUCONATE CLOTH 2 % EX PADS: 6.0000 | MEDICATED_PAD | Freq: Once | CUTANEOUS | Status: DC

## 2016-12-30 MED ORDER — MIDAZOLAM HCL 2 MG/2ML IJ SOLN
INTRAMUSCULAR | Status: AC
  Filled 2016-12-30: qty 2

## 2016-12-30 MED ORDER — OXYCODONE HCL 5 MG PO TABS: 5.0000 mg | ORAL_TABLET | Freq: Once | ORAL | Status: DC | PRN

## 2016-12-30 MED ORDER — LIDOCAINE HCL (PF) 1 % IJ SOLN
INTRAMUSCULAR | Status: AC
  Filled 2016-12-30: qty 5

## 2016-12-30 MED ORDER — BUPIVACAINE HCL (PF) 0.5 % IJ SOLN
INTRAMUSCULAR | Status: AC
  Filled 2016-12-30: qty 30

## 2016-12-30 MED ORDER — FENTANYL CITRATE (PF) 250 MCG/5ML IJ SOLN
INTRAMUSCULAR | Status: AC
  Filled 2016-12-30: qty 5

## 2016-12-30 MED ORDER — FENTANYL CITRATE (PF) 100 MCG/2ML IJ SOLN
INTRAMUSCULAR | Status: AC
  Filled 2016-12-30: qty 2

## 2016-12-30 MED ORDER — OXYCODONE HCL 5 MG/5ML PO SOLN: 5.0000 mg | Freq: Once | ORAL | Status: DC | PRN

## 2016-12-30 MED ORDER — LIDOCAINE HCL 1 % IJ SOLN
INTRAMUSCULAR | Status: DC | PRN
  Administered 2016-12-30: 25 mg via INTRADERMAL

## 2016-12-30 MED ORDER — MIDAZOLAM HCL 2 MG/2ML IJ SOLN
1.0000 mg | Freq: Once | INTRAMUSCULAR | Status: AC | PRN
  Administered 2016-12-30: 2 mg via INTRAVENOUS
  Filled 2016-12-30: qty 2

## 2016-12-30 MED ORDER — BUPIVACAINE HCL 0.5 % IJ SOLN
INTRAMUSCULAR | Status: DC | PRN
  Administered 2016-12-30: 10 mL

## 2016-12-30 MED ORDER — PROPOFOL 10 MG/ML IV BOLUS
INTRAVENOUS | Status: DC | PRN
Start: 2016-12-30 — End: 2016-12-30
  Administered 2016-12-30: 130 mg via INTRAVENOUS

## 2016-12-30 MED ORDER — INSULIN ASPART 100 UNIT/ML ~~LOC~~ SOLN
3.0000 [IU] | Freq: Once | SUBCUTANEOUS | Status: AC
  Administered 2016-12-30: 3 [IU] via SUBCUTANEOUS
  Filled 2016-12-30: qty 0.03

## 2016-12-30 MED ORDER — FENTANYL CITRATE (PF) 100 MCG/2ML IJ SOLN
25.0000 ug | INTRAMUSCULAR | Status: DC | PRN
  Administered 2016-12-30: 50 ug via INTRAVENOUS

## 2016-12-30 SURGICAL SUPPLY — 49 items
ADH SKN CLS APL DERMABOND .7 (GAUZE/BANDAGES/DRESSINGS) ×1
APPLIER CLIP 11 MED OPEN (CLIP)
APPLIER CLIP 9.375 SM OPEN (CLIP)
APR CLP MED 11 20 MLT OPN (CLIP)
APR CLP SM 9.3 20 MLT OPN (CLIP)
BAG HAMPER (MISCELLANEOUS) ×3 IMPLANT
BLADE 15 SAFETY STRL DISP (BLADE) ×3 IMPLANT
BNDG CMPR 82X61 PLY HI ABS (GAUZE/BANDAGES/DRESSINGS) ×1
BNDG CONFORM 6X.82 1P STRL (GAUZE/BANDAGES/DRESSINGS) ×3 IMPLANT
CLIP APPLIE 11 MED OPEN (CLIP) IMPLANT
CLIP APPLIE 9.375 SM OPEN (CLIP) IMPLANT
CLOSURE WOUND 1/2 X4 (GAUZE/BANDAGES/DRESSINGS) ×1
CLOTH BEACON ORANGE TIMEOUT ST (SAFETY) ×3 IMPLANT
COVER LIGHT HANDLE STERIS (MISCELLANEOUS) ×6 IMPLANT
DECANTER SPIKE VIAL GLASS SM (MISCELLANEOUS) ×3 IMPLANT
DERMABOND ADVANCED (GAUZE/BANDAGES/DRESSINGS) ×2
DERMABOND ADVANCED .7 DNX12 (GAUZE/BANDAGES/DRESSINGS) IMPLANT
DURAPREP 26ML APPLICATOR (WOUND CARE) ×3 IMPLANT
ELECT REM PT RETURN 9FT ADLT (ELECTROSURGICAL) ×3
ELECTRODE REM PT RTRN 9FT ADLT (ELECTROSURGICAL) ×1 IMPLANT
EVACUATOR DRAINAGE 10X20 100CC (DRAIN) ×1 IMPLANT
EVACUATOR SILICONE 100CC (DRAIN) ×3
FORMALIN 10 PREFIL 480ML (MISCELLANEOUS) ×3 IMPLANT
GAUZE SPONGE 4X4 12PLY STRL (GAUZE/BANDAGES/DRESSINGS) ×3 IMPLANT
GLOVE BIOGEL PI IND STRL 7.0 (GLOVE) ×1 IMPLANT
GLOVE BIOGEL PI INDICATOR 7.0 (GLOVE) ×2
GLOVE SURG SS PI 7.5 STRL IVOR (GLOVE) ×6 IMPLANT
GOWN STRL REUS W/TWL LRG LVL3 (GOWN DISPOSABLE) ×9 IMPLANT
HEMOSTAT ARISTA ABSORB 3G PWDR (MISCELLANEOUS) ×4 IMPLANT
INST SET MINOR GENERAL (KITS) ×3 IMPLANT
KIT ROOM TURNOVER APOR (KITS) ×3 IMPLANT
MANIFOLD NEPTUNE II (INSTRUMENTS) ×3 IMPLANT
NDL HYPO 25X1 1.5 SAFETY (NEEDLE) ×1 IMPLANT
NEEDLE HYPO 25X1 1.5 SAFETY (NEEDLE) ×3 IMPLANT
NS IRRIG 1000ML POUR BTL (IV SOLUTION) ×3 IMPLANT
PACK MINOR (CUSTOM PROCEDURE TRAY) ×3 IMPLANT
PAD ARMBOARD 7.5X6 YLW CONV (MISCELLANEOUS) ×3 IMPLANT
SET BASIN LINEN APH (SET/KITS/TRAYS/PACK) ×3 IMPLANT
SPONGE INTESTINAL PEANUT (DISPOSABLE) ×3 IMPLANT
SPONGE LAP 18X18 X RAY DECT (DISPOSABLE) ×3 IMPLANT
STOCKINETTE IMPERVIOUS LG (DRAPES) ×3 IMPLANT
STRIP CLOSURE SKIN 1/2X4 (GAUZE/BANDAGES/DRESSINGS) ×2 IMPLANT
SUT ETHILON 3 0 FSL (SUTURE) ×3 IMPLANT
SUT SILK 3 0 (SUTURE) ×3
SUT SILK 3-0 FS1 18XBRD (SUTURE) ×1 IMPLANT
SUT VIC AB 3-0 SH 27 (SUTURE) ×3
SUT VIC AB 3-0 SH 27X BRD (SUTURE) ×1 IMPLANT
SUT VIC AB 4-0 PS2 27 (SUTURE) ×3 IMPLANT
SYR CONTROL 10ML LL (SYRINGE) ×3 IMPLANT

## 2016-12-30 NOTE — Anesthesia Preprocedure Evaluation (Signed)
Anesthesia Evaluation  Patient identified by MRN, date of birth, ID band Patient awake  General Assessment Comment:Patient just finished 12 weeks of chemotherapy  Airway Mallampati: I  TM Distance: >3 FB     Dental  (+) Poor Dentition   Pulmonary    Pulmonary exam normal        Cardiovascular hypertension, Normal cardiovascular exam Rhythm:Regular Rate:Normal     Neuro/Psych    GI/Hepatic   Endo/Other  diabetes, Well Controlled, Type 2  Renal/GU      Musculoskeletal  (+) Arthritis ,   Abdominal Normal abdominal exam  (+)   Peds  Hematology   Anesthesia Other Findings   Reproductive/Obstetrics                             Anesthesia Physical Anesthesia Plan  ASA: IV  Anesthesia Plan: General   Post-op Pain Management:    Induction: Intravenous  PONV Risk Score and Plan:   Airway Management Planned: LMA  Additional Equipment:   Intra-op Plan:   Post-operative Plan: Extubation in OR  Informed Consent: I have reviewed the patients History and Physical, chart, labs and discussed the procedure including the risks, benefits and alternatives for the proposed anesthesia with the patient or authorized representative who has indicated his/her understanding and acceptance.     Plan Discussed with:   Anesthesia Plan Comments:         Anesthesia Quick Evaluation

## 2016-12-30 NOTE — OR Nursing (Signed)
From radiology 

## 2016-12-30 NOTE — Discharge Instructions (Signed)
Partial Mastectomy With or Without Axillary Lymph Node Removal, Care After °Refer to this sheet in the next few weeks. These instructions provide you with information about caring for yourself after your procedure. Your health care provider may also give you more specific instructions. Your treatment has been planned according to current medical practices, but problems sometimes occur. Call your health care provider if you have any problems or questions after your procedure. °What can I expect after the procedure? °After your procedure, it is common to have: °· Breast swelling. °· Breast tenderness. °· Stiffness in your arm or shoulder. °· A change in the shape and feel of your breast. ° °Follow these instructions at home: °Bathing °· Take sponge baths until your health care provider says that you can start showering or bathing. °· Do not take baths, swim, or use a hot tub until your health care provider approves. °Incision care °· There are many different ways to close and cover an incision, including stitches, skin glue, and adhesive strips. Follow your health care provider's instructions about: °? Incision care. °? Bandage (dressing) changes and removal. °? Incision closure removal. °· Check your incision area every day for signs of infection. Watch for: °? Redness, swelling, or pain. °? Fluid, blood, or pus. °· If you were sent home with a surgical drain in place, follow your health care provider's instructions for emptying it. °Activity °· Return to your normal activities as directed by your health care provider. °· Avoid strenuous exercise. °· Be careful to avoid any activities that could cause an injury to your arm on the side of your surgery. °· Do not lift anything that is heavier than 10 lb (4.5 kg). Avoid lifting with the arm that is on the side of your surgery. °· Do not carry heavy objects on your shoulder. °· After your drain is removed, you should perform exercises to keep your arm from getting stiff  and swollen. Talk with your health care provider about which exercises are safe for you. °General instructions °· Take medicines only as directed by your health care provider. °· Keep your dressing clean and dry. °· You may eat what you usually do. °· Wear a supportive bra as directed by your health care provider. °· Keep your arm elevated when at rest. °· Do not wear tight jewelry on your arm, wrist, or fingers on the side of your surgery. °· Get checked for extra fluid around your lymph nodes (lymphedema) as often as told by your health care provider. °· If you had any lymph nodes removed during your procedure, be sure to tell all of your health care providers. This is important information to share before you are involved in certain procedures, such as giving blood or having your blood pressure taken. °Contact a health care provider if: °· You have a fever. °· Your pain medicine is not working. °· Your swelling, weakness, or numbness in your arm has not improved after a few weeks. °· You have new swelling in your breast or arm. °· You have redness, swelling, or pain in your incision area. °· You have fluid, blood, or pus coming from your incision. °Get help right away if: °· You have very bad pain in your breast or arm. °· You have chest pain. °· You have difficulty breathing. °This information is not intended to replace advice given to you by your health care provider. Make sure you discuss any questions you have with your health care provider. °Document Released: 02/04/2004 Document   Revised: 02/27/2016 Document Reviewed: 03/07/2014 °Elsevier Interactive Patient Education © 2018 [employer] ° ° °PATIENT INSTRUCTIONS °POST-ANESTHESIA ° °IMMEDIATELY FOLLOWING SURGERY:  Do not drive or operate machinery for the first twenty four hours after surgery.  Do not make any important decisions for twenty four hours after surgery or while taking narcotic pain medications or sedatives.  If you develop intractable nausea  and vomiting or a severe headache please notify your doctor immediately. ° °FOLLOW-UP:  Please make an appointment with your surgeon as instructed. You do not need to follow up with anesthesia unless specifically instructed to do so. ° °WOUND CARE INSTRUCTIONS (if applicable):  Keep a dry clean dressing on the anesthesia/puncture wound site if there is drainage.  Once the wound has quit draining you may leave it open to air.  Generally you should leave the bandage intact for twenty four hours unless there is drainage.  If the epidural site drains for more than 36-48 hours please call the anesthesia department. ° °QUESTIONS?:  Please feel free to call your physician or the hospital operator if you have any questions, and they will be happy to assist you.    ° ° ° °

## 2016-12-30 NOTE — OR Nursing (Signed)
To radiology

## 2016-12-30 NOTE — Op Note (Signed)
Patient:  [name]  DOB:  11-Feb-1956  MRN:  [id number]   Preop Diagnosis:  Right breast carcinoma with axillary metastasis   Postop Diagnosis:  Same  Procedure:  Right partial mastectomy after needle localization, axillary lymph node dissection  Surgeon:  Aviva Signs, M.D.  Anes:  Gen.  Indications:  Patient is a 61 year old black female who known history of right breast carcinoma with metastatic disease to the axilla who underwent neoadjuvant therapy. She now presents for right partial mastectomy with axillary lymph node dissection. The risks and benefits of the procedure including bleeding, infection, nerve injury, and the possibility of needing additional tissue sampling were fully explained to the patient, who gave informed consent.  Procedure note:  The patient was placed the supine position. The patient had already gone needle localization in radiology department. After general anesthesia was administered, though right breast and axilla were prepped and draped using the usual sterile technique with DuraPrep. Surgical site confirmation was performed.  For first performed a right partial mastectomy. A curvilinear incision was made to include the guidewire which was at the 9:00 position in the right breast. The dissection was taken taken down to the area of concern. A wide excision was performed. A short suture was placed superiorly and a long suture placed laterally for orientation purposes. The specimen was then sent to radiology for specimen radiography. The specimen did have the suspicious lesion and clip within the specimen removed. It was then sent to pathology for examination. A bleeding was controlled using Bovie electrocautery. The wound was irrigated with normal saline. Arista was placed into the mastectomy site. The subcutaneous layer was reapproximated using a 3-0 Vicryl interrupted suture. 0.5% Sensorcaine was instilled into the surrounding wound. The skin was closed using  a 4-0 Vicryl subcuticular suture. Dermabond was applied.  Next, a right axillary dissection was performed. An incision was made into the right axilla. A level II axillary dissection was performed. Care was taken to avoid the long thoracic nerve, thoracodorsal artery vein and nerve complex. The intercostal brachial nerve was also identified and left intact. Multiple palpable lymph nodes were present. Any bleeding was controlled using clips. The right axillary contents were removed and sent to pathology for examination. Arista was placed into the wound after irrigation. The subcutaneous layer was reapproximated using a 3-0 Vicryl interrupted suture. The skin was closed using a 4-0 Vicryl subcuticular suture. 0.5% Sensorcaine was instilled into the surrounding wound. Dermabond was applied.  All tape and needle counts were correct at the end the procedure. The patient was awakened and transferred to PACU in stable condition.   Complications: None  EBL:  100 mL   Specimen:Right breast tissue, short suture superior, long suture lateral Right axillary contents

## 2016-12-30 NOTE — Interval H&P Note (Signed)
History and Physical Interval Note:  12/30/2016 10:48 AM  [name]  has presented today for surgery, with the diagnosis of right breast cancer  The various methods of treatment have been discussed with the patient and family. After consideration of risks, benefits and other options for treatment, the patient has consented to  Procedure(s): PARTIAL MASTECTOMY WITH NEEDLE LOCALIZATION (Right) AXILLARY LYMPH NODE DISSECTION (Right) as a surgical intervention .  The patient's history has been reviewed, patient examined, no change in status, stable for surgery.  I have reviewed the patient's chart and labs.  Questions were answered to the patient's satisfaction.     Aviva Signs

## 2016-12-30 NOTE — Transfer of Care (Signed)
Immediate Anesthesia Transfer of Care Note  Patient: [name]  Procedure(s) Performed: Procedure(s): PARTIAL MASTECTOMY WITH NEEDLE LOCALIZATION (Right) AXILLARY LYMPH NODE DISSECTION (Right)  Patient Location: PACU  Anesthesia Type:General  Level of Consciousness: sedated  Airway & Oxygen Therapy: Patient Spontanous Breathing  Post-op Assessment: Report given to RN, Post -op Vital signs reviewed and stable and Patient moving all extremities  Post vital signs: Reviewed and stable  Last Vitals:  Vitals:   12/30/16 1109 12/30/16 1110  BP:    Pulse:    Resp: (!) 24 16  Temp:      Last Pain:  Vitals:   12/30/16 0800  TempSrc: Oral  PainSc: 0-No pain         Complications: No apparent anesthesia complications

## 2016-12-30 NOTE — Anesthesia Postprocedure Evaluation (Signed)
Anesthesia Post Note  Patient: [name]  Procedure(s) Performed: Procedure(s) (LRB): PARTIAL MASTECTOMY WITH NEEDLE LOCALIZATION (Right) AXILLARY LYMPH NODE DISSECTION (Right)  Patient location during evaluation: PACU Anesthesia Type: General Level of consciousness: awake and patient cooperative Pain management: pain level controlled Vital Signs Assessment: post-procedure vital signs reviewed and stable Respiratory status: spontaneous breathing, nonlabored ventilation and respiratory function stable Cardiovascular status: blood pressure returned to baseline Postop Assessment: no signs of nausea or vomiting Anesthetic complications: no     Last Vitals:  Vitals:   12/30/16 1348 12/30/16 1350  BP:    Pulse: 87 84  Resp: 14 16  Temp:      Last Pain:  Vitals:   12/30/16 1330  TempSrc:   PainSc: 0-No pain                 [name] J

## 2016-12-30 NOTE — Anesthesia Procedure Notes (Signed)
Procedure Name: LMA Insertion Date/Time: 12/30/2016 11:43 AM Performed by: Charmaine Downs Pre-anesthesia Checklist: Patient identified, Patient being monitored, Emergency Drugs available, Timeout performed and Suction available Patient Re-evaluated:Patient Re-evaluated prior to inductionOxygen Delivery Method: Circle System Utilized Preoxygenation: Pre-oxygenation with 100% oxygen Intubation Type: IV induction Ventilation: Mask ventilation without difficulty LMA: LMA inserted LMA Size: 4.0 Number of attempts: 1 Placement Confirmation: positive ETCO2 and breath sounds checked- equal and bilateral Tube secured with: Tape Dental Injury: Teeth and Oropharynx as per pre-operative assessment

## 2016-12-31 ENCOUNTER — Encounter (HOSPITAL_COMMUNITY): Payer: Self-pay | Admitting: General Surgery

## 2017-01-05 ENCOUNTER — Ambulatory Visit (INDEPENDENT_AMBULATORY_CARE_PROVIDER_SITE_OTHER): Payer: No Typology Code available for payment source | Admitting: General Surgery

## 2017-01-05 ENCOUNTER — Encounter: Payer: Self-pay | Admitting: General Surgery

## 2017-01-05 VITALS — BP 114/75 | HR 95 | Temp 96.0°F | Resp 18 | Ht 64.0 in | Wt 177.0 lb

## 2017-01-05 DIAGNOSIS — Z09 Encounter for follow-up examination after completed treatment for conditions other than malignant neoplasm: Secondary | ICD-10-CM

## 2017-01-05 NOTE — Progress Notes (Signed)
Subjective:     [name]  Status post right partial mastectomy with right axillary dissection. Patient doing well and has no complaints. Objective:    BP 114/75   Pulse 95   Temp (!) 96 F (35.6 C)   Resp 18   Ht 5\' 4"  (1.626 m)   Wt 177 lb (80.3 kg)   BMI 30.38 kg/m   General:  alert, cooperative and no distress  Right breast and axillary incisions healing well.   final pathology reveals no residual cancer in the right breast. 9 lymph nodes negative for carcinoma. Patient has been made aware.  Assessment:    Doing well postoperatively.    Plan:   Patient to follow up with oncology as previously scheduled. Follow-up. As needed.

## 2017-01-19 ENCOUNTER — Encounter (HOSPITAL_COMMUNITY): Payer: Self-pay | Admitting: Lab

## 2017-01-19 ENCOUNTER — Encounter (HOSPITAL_COMMUNITY): Payer: No Typology Code available for payment source | Attending: Hematology & Oncology | Admitting: Oncology

## 2017-01-19 ENCOUNTER — Encounter (HOSPITAL_COMMUNITY): Payer: Self-pay

## 2017-01-19 VITALS — BP 128/66 | HR 84 | Temp 98.0°F | Resp 16 | Wt 178.3 lb

## 2017-01-19 DIAGNOSIS — C50411 Malignant neoplasm of upper-outer quadrant of right female breast: Secondary | ICD-10-CM | POA: Diagnosis not present

## 2017-01-19 DIAGNOSIS — C773 Secondary and unspecified malignant neoplasm of axilla and upper limb lymph nodes: Secondary | ICD-10-CM | POA: Diagnosis not present

## 2017-01-19 DIAGNOSIS — Z01818 Encounter for other preprocedural examination: Secondary | ICD-10-CM | POA: Insufficient documentation

## 2017-01-19 DIAGNOSIS — Z170 Estrogen receptor positive status [ER+]: Secondary | ICD-10-CM | POA: Insufficient documentation

## 2017-01-19 NOTE — Progress Notes (Unsigned)
Referral to Rad Onc in [place]. Records faxed on 7/17

## 2017-01-19 NOTE — Progress Notes (Signed)
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    BRIEF ONCOLOGIC HISTORY:    Breast cancer of upper-outer quadrant of right female breast ([place])   06/24/2016 Mammogram    Diagnostic bilateral mammogram: highly suspicious 3.5 cm mass in the UO R breast with marked enlarged R axillary LN      06/24/2016 Breast US    2.7 x 3.1 x 3.5 cm slightly irregular hypoechoic mass at the 10:00 position of right breast, 6 cm from the nipple.  4.1 x 6 cm right axillar lymph node also identified.      07/01/2016 Pathology Results    Infiltrating ductal carcinoma ER+ 80%, PR-, HER 2-, Ki67 90%      07/01/2016 Initial Biopsy    Ultrasound guided R breast biopsy and R axillary LN biopsy both were clipped post biopsy      07/20/2016 Imaging    MUGA- Upper normal left ventricular ejection fraction of 75% with normal LV wall motion.      07/21/2016 Imaging    CT CAP- 2.9 cm right upper outer quadrant breast mass, consistent with known primary breast carcinoma.  Right axillary and subpectoral lymphadenopathy, consistent with metastatic disease.  Tiny sub-cm bilateral pulmonary nodules are indeterminate, but favor postinflammatory etiology over early metastases. Recommend continued attention on follow-up CT.  No evidence of metastatic disease within the abdomen or pelvis.      07/23/2016 Procedure    Port placed by Dr. Barry [name]      07/24/2016 - 12/04/2016 Neo-Adjuvant Chemotherapy    Adriamycin/Cytoxan x 4, followed by weekly Taxol x 12       07/30/2016 Imaging    Bone scan: Negative for osseous disease .      09/22/2016 Imaging    CT head- 1. Normal CT  of the brain. No evidence of intracranial metastatic disease. 2. Frothy secretions throughout most of the paranasal sinuses. Correlate clinically for acute sinusitis. This could serve as a source of headache.       INTERVAL HISTORY:  [name] returns for follow-up. Since her last visit she has undergone a right lumpectomy with axillary lymph node dissection on 12/30/16 by Dr. Arnoldo [name]. Her surgical path demonstrated no residual carcinoma, 0 out of 9 lymph nodes removed were positive for carcinoma. Final path ypT0, ypN0. She states she has tenderness at the surgical sites but otherwise is feeling well. Her neuropathy is improving and she only has numbness in her right thumb and bilateral toes at night.    REVIEW OF SYSTEMS:  Review of Systems  Constitutional: Negative.  Negative for chills, fatigue and fever.  HENT:  Negative.  Negative for lump/mass and nosebleeds.   Eyes: Negative.   Respiratory: Negative for cough and shortness of breath.   Cardiovascular: Negative.  Negative for chest pain and leg swelling.  Gastrointestinal: Negative.  Negative for abdominal pain, blood in stool, constipation, diarrhea, nausea and vomiting.  Endocrine: Negative.   Genitourinary: Negative.  Negative for dysuria and hematuria.   Musculoskeletal: Negative.  Negative for arthralgias.  Skin: Negative.  Negative for rash.  Neurological: Positive for numbness. Negative for dizziness and headaches.  Hematological: Negative.  Negative for adenopathy. Does not bruise/bleed easily.  Psychiatric/Behavioral:  Negative.  Negative for depression and sleep disturbance. The patient is not nervous/anxious.      PAST MEDICAL/SURGICAL HISTORY:  Past Medical History:  Diagnosis Date  . Arthritis   . Breast cancer of upper-outer quadrant of right female breast ([place]) 07/14/2016  . Cancer ([place])   . Cough    dry  . Diabetes mellitus without complication ([place])   . Hypertension   . Ruptured eardrum    left    Past  Surgical History:  Procedure Laterality Date  . ABDOMINAL HYSTERECTOMY     with bilateral bso  . AXILLARY LYMPH NODE DISSECTION Right 12/30/2016   Procedure: AXILLARY LYMPH NODE DISSECTION;  Surgeon: Christine Signs, MD;  Location: [facility];  Service: General;  Laterality: Right;  . COLONOSCOPY    . otif wrist Right   . PARTIAL MASTECTOMY WITH NEEDLE LOCALIZATION Right 12/30/2016   Procedure: PARTIAL MASTECTOMY WITH NEEDLE LOCALIZATION;  Surgeon: Christine Signs, MD;  Location: [facility];  Service: General;  Laterality: Right;  . PORTACATH PLACEMENT Left 07/23/2016   Procedure: INSERTION PORT-A-CATH;  Surgeon: Christine Klein, MD;  Location: [facility];  Service: General;  Laterality: Left;  . ruptured ear drum Left      SOCIAL HISTORY:  Social History   Social History  . Marital status: Single    Spouse name: N/A  . Number of children: N/A  . Years of education: N/A   Occupational History  . Not on file.   Social History Main Topics  . Smoking status: Former Smoker    Packs/day: 0.50    Years: 4.00    Types: Cigarettes  . Smokeless tobacco: Never Used  . Alcohol use No  . Drug use: No  . Sexual activity: Not Currently    Birth control/ protection: Surgical   Other Topics Concern  . Not on file   Social History Narrative  . No narrative on file    FAMILY HISTORY:  No family history on file.  CURRENT MEDICATIONS:  Outpatient Encounter Prescriptions as of 01/19/2017  Medication Sig  . amLODipine (NORVASC) 10 MG tablet Take 10 mg by mouth daily.  [name] amoxicillin-clavulanate (AUGMENTIN) 875-125 MG tablet Take 1 tablet by mouth 2 (two) times daily. 7 day course started 12-19-16 for sinus infection  . aspirin EC 81 MG tablet Take 81 mg by mouth daily.  [name] atorvastatin (LIPITOR) 10 MG tablet Take 10 mg by mouth daily at 6 PM.   . Calcium Carbonate (CALCIUM 600 PO) Take 600 mg by mouth daily.  . cholecalciferol (VITAMIN D) 1000 units tablet Take 1,000 Units by mouth daily.   [name] glyBURIDE-metformin (GLUCOVANCE) 5-500 MG tablet Take 1 tablet by mouth 2 (two) times daily.  . hydrochlorothiazide (HYDRODIURIL) 25 MG tablet Take 25 mg by mouth daily.  [name] lisinopril (PRINIVIL,ZESTRIL) 40 MG tablet Take 40 mg by mouth daily.  . Omega-3 Fatty Acids (FISH OIL) 1000 MG CAPS Take 1,000 mg by mouth daily.  [name] oxyCODONE-acetaminophen (PERCOCET) 7.5-325 MG tablet Take 1-2 tablets by mouth every 4 (four) hours as needed.   No facility-administered encounter medications on file as of 01/19/2017.     ALLERGIES:  No Known Allergies   PHYSICAL EXAM:  ECOG Performance status: 0-1 - Symptomatic, but independent.      Physical Exam  Constitutional: She is oriented to person, place, and time and well-developed, well-nourished, and in no distress. No distress.  HENT:  Head: Normocephalic and atraumatic.  Mouth/Throat: Oropharynx is clear and moist. No oropharyngeal exudate.  Eyes: Pupils are equal, round, and reactive to light. Conjunctivae are normal. No scleral icterus.  Neck: Normal range of motion. Neck supple. No JVD present.  Cardiovascular: Normal rate, regular rhythm and normal heart sounds.  Exam reveals no gallop and no friction rub.   No murmur heard. Pulmonary/Chest: Effort normal and breath sounds normal. No respiratory distress. She has no wheezes. She has no rales.  Abdominal: Soft. Bowel sounds are normal. She exhibits no distension. There is no tenderness. There is no guarding.  Musculoskeletal: Normal range of motion. She exhibits no edema or tenderness.  Lymphadenopathy:    She has no cervical adenopathy.       Right: No supraclavicular adenopathy present.       Left: No supraclavicular adenopathy present.  Neurological: She is alert and oriented to person, place, and time. No cranial nerve deficit. Gait normal.  Skin: Skin is warm and dry. No rash noted. No erythema. No pallor.  Psychiatric: Mood, memory, affect and judgment normal.  Nursing note and  vitals reviewed.    LABORATORY DATA:  I have reviewed the labs as listed.  CBC    Component Value Date/Time   WBC 6.3 12/25/2016 1400   RBC 4.27 12/25/2016 1400   HGB 13.3 12/25/2016 1400   HCT 38.2 12/25/2016 1400   PLT 261 12/25/2016 1400   MCV 89.5 12/25/2016 1400   MCH 31.1 12/25/2016 1400   MCHC 34.8 12/25/2016 1400   RDW 12.8 12/25/2016 1400   LYMPHSABS 1.9 12/25/2016 1400   MONOABS 0.4 12/25/2016 1400   EOSABS 0.1 12/25/2016 1400   BASOSABS 0.0 12/25/2016 1400   CMP Latest Ref Rng & Units 12/25/2016 12/04/2016 11/27/2016  Glucose 65 - 99 mg/dL 245(H) 295(H) 374(H)  BUN 6 - 20 mg/dL _0 Creatinine 0.44 - 1.00 mg/dL 0.91 0.83 1.01(H)  Sodium 135 - 145 mmol/L 139 136 131(L)  Potassium 3.5 - 5.1 mmol/L 3.2(L) 3.5 3.8  Chloride 101 - 111 mmol/L 102 102 97(L)  CO2 22 - 32 mmol/L _1 Calcium 8.9 - 10.3 mg/dL 10.3 9.7 9.9  Total Protein 6.5 - 8.1 g/dL 7.0 7.0 6.9  Total Bilirubin 0.3 - 1.2 mg/dL 0.6 0.7 0.9  Alkaline Phos 38 - 126 U/L 90 79 85  AST 15 - 41 U/L _2 ALT 14 - 54 U/L 33 43 29    PENDING LABS:    DIAGNOSTIC IMAGING:  Port-a-cath dye study: 09/04/16     CT brain: 09/21/16    PATHOLOGY:  Right breast and axillary LN biopsy: 07/01/16       Patient: Christine, [name] Collected: 12/30/2016 Client: [facility] Accession: [id number] Received: 12/30/2016 [name] DOB: [birth year] Age: 61 Gender: F Reported: 01/01/2017 [address] Patient Ph: [phone] MRN #: [id number] Linna Hoff, [place] [address] Visit #: [id number] Chart #: Phone: [phone] Fax: CC: REPORT OF SURGICAL PATHOLOGY FINAL DIAGNOSIS Diagnosis 1. Breast, lumpectomy, right - FIBROCYSTIC AND FIBROADENOMATOID CHANGE. - USUAL DUCTAL HYPERPLASIA. - TREATMENT EFFECT. - NO RESIDUAL CARCINOMA IDENTIFIED. - SEE ONCOLOGY TABLE. 2. Lymph nodes, regional resection, right axillary contents - NINE OF NINE LYMPH NODES NEGATIVE FOR CARCINOMA (0/9). - TREATMENT  EFFECT. Microscopic Comment 1. BREAST, STATUS POST NEOADJUVANT TREATMENT Procedure: Right breast lumpectomy and right axillary dissection. Laterality: Right. Tumor Size: No residual tumor. Histologic Type: Invasive ductal carcinoma (per biopsy [phone]). Grade: 3 Tubular Differentiation: 3 Nuclear Pleomorphism: 3 Mitotic Count: 3 Ductal Carcinoma in Situ (DCIS): Not identified. Regional Lymph Nodes:  Number of Lymph Nodes Examined: 9 Number of Sentinel Lymph Nodes Examined: 0 Lymph Nodes with Macrometastases: 0 Lymph Nodes with Micrometastases: 0 Lymph Nodes with Isolated Tumor Cells: 0 Margins: Invasive carcinoma, distance from closest margin: N/A. DCIS, distance from closest margin: N/A. Extent of Tumor: N/A. Breast Prognostic Profile (pre-neoadjuvant case #: [id number]). 1 of 3 FINAL for Brum, Fortune L [phone]) Microscopic Comment(continued) Estrogen Receptor: Positive, 80% moderate staining. Progesterone Receptor: Negative. Her2: Negative (ratio 1.39) Ki-67: 90% Will not be repeated due to lack of tumor. Residual Cancer Burden (RCB): Complete pathologic response. 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  PLAN: Patient had a great response to neoadjuvant chemo with no residual carcinoma on her lumpectomy. Will refer her to rad-onc in [facility] for adjuvant breast radiation. After radiation she will be placed on endocrine therapy with an AI which we will discuss in more detail on her  next visit. RTC in 8 weeks for follow up.    All questions were answered to patient's stated satisfaction. Encouraged patient to call with any new concerns or questions before her next visit to the cancer center and we can certain see her sooner, if needed.    Twana First, MD

## 2017-01-19 NOTE — Patient Instructions (Signed)
[facility] at [facility] Discharge Instructions  RECOMMENDATIONS MADE BY THE CONSULTANT AND ANY TEST RESULTS WILL BE SENT TO YOUR REFERRING PHYSICIAN.  You saw Dr. Zhou today.  Thank you for choosing [facility] at [facility] to provide your oncology and hematology care.  To afford each patient quality time with our provider, please arrive at least 15 minutes before your scheduled appointment time.    If you have a lab appointment with the Cancer Center please come in thru the  Main Entrance and check in at the main information desk  You need to re-schedule your appointment should you arrive 10 or more minutes late.  We strive to give you quality time with our providers, and arriving late affects you and other patients whose appointments are after yours.  Also, if you no show three or more times for appointments you may be dismissed from the clinic at the providers discretion.     Again, thank you for choosing [facility].  Our hope is that these requests will decrease the amount of time that you wait before being seen by our physicians.       _____________________________________________________________  Should you have questions after your visit to [facility], please contact our office at [phone] between the hours of 8:30 a.m. and 4:30 p.m.  Voicemails left after 4:30 p.m. will not be returned until the following business day.  For prescription refill requests, have your pharmacy contact our office.       Resources For Cancer Patients and their Caregivers ? American Cancer Society: Can assist with transportation, wigs, general needs, runs Look Good Feel Better.        1-888-227-6333 ? Cancer Care: Provides financial assistance, online support groups, medication/co-pay assistance.  1-800-813-HOPE (4673) ? [facility] Assists [place] cancer patients and their families through  emotional , educational and financial support.  336-427-4357 ? [place] DSS Where to apply for food stamps, Medicaid and utility assistance. 336-342-1394 ? [facility]: Transportation to medical appointments. 336-347-2287 ? Social Security Administration: May apply for disability if have a Stage IV cancer. 336-342-7796 1-800-772-1213 ? [place] Aging, Disability and Transit Services: Assists with nutrition, care and transit needs. 336-349-2343  Cancer Center Support Programs: @10RELATIVEDAYS@ > Cancer Support Group  2nd Tuesday of the month 1pm-2pm, Journey Room  > Creative Journey  3rd Tuesday of the month 1130am-1pm, Journey Room  > Look Good Feel Better  1st Wednesday of the month 10am-12 noon, Journey Room (Call American Cancer Society to register 1-800-395-5775)    

## 2017-03-22 ENCOUNTER — Ambulatory Visit (HOSPITAL_COMMUNITY): Payer: No Typology Code available for payment source

## 2017-03-22 ENCOUNTER — Other Ambulatory Visit (HOSPITAL_COMMUNITY): Payer: Self-pay | Admitting: Emergency Medicine

## 2017-03-22 ENCOUNTER — Other Ambulatory Visit (HOSPITAL_COMMUNITY): Payer: Self-pay | Admitting: Oncology

## 2017-03-22 ENCOUNTER — Other Ambulatory Visit (HOSPITAL_COMMUNITY): Payer: No Typology Code available for payment source

## 2017-03-22 DIAGNOSIS — C50411 Malignant neoplasm of upper-outer quadrant of right female breast: Secondary | ICD-10-CM

## 2017-03-22 MED ORDER — ANASTROZOLE 1 MG PO TABS: 1.0000 mg | ORAL_TABLET | Freq: Every day | ORAL | 1 refills | Status: DC

## 2017-03-22 NOTE — Progress Notes (Signed)
Pt called today to tell her about the arimidex that Dr Talbert Cage had called in for her.  We went over some of the side effects that she may experience.  Hot flashes, increased cholesterol over time, decrease in bone density over time.  I told her to start taking this once she was done with radiation.  My number was provided in case she had any questions.  Referral to PT made.  Pt states that she is not having an issues at the prssent.  I explained that with the removal of lymph nodes that in the future she may have problems.  PT would meet with her and do an evaluation and provide her with exercises that would help prevent any lymphedema that may occur later down the road.  She verbalized understanding.

## 2017-04-16 ENCOUNTER — Ambulatory Visit (HOSPITAL_COMMUNITY): Payer: No Typology Code available for payment source | Admitting: Specialist

## 2017-04-16 ENCOUNTER — Encounter (HOSPITAL_COMMUNITY)
Payer: No Typology Code available for payment source | Attending: Hematology & Oncology | Admitting: Hematology and Oncology

## 2017-04-16 ENCOUNTER — Other Ambulatory Visit (HOSPITAL_COMMUNITY): Payer: No Typology Code available for payment source

## 2017-04-16 DIAGNOSIS — Z170 Estrogen receptor positive status [ER+]: Secondary | ICD-10-CM | POA: Insufficient documentation

## 2017-04-16 DIAGNOSIS — C50411 Malignant neoplasm of upper-outer quadrant of right female breast: Secondary | ICD-10-CM | POA: Insufficient documentation

## 2017-04-16 DIAGNOSIS — Z01818 Encounter for other preprocedural examination: Secondary | ICD-10-CM | POA: Insufficient documentation

## 2017-05-05 ENCOUNTER — Encounter (HOSPITAL_COMMUNITY): Payer: No Typology Code available for payment source | Attending: Oncology | Admitting: Oncology

## 2017-05-05 ENCOUNTER — Encounter (HOSPITAL_COMMUNITY): Payer: Self-pay

## 2017-05-05 ENCOUNTER — Encounter (HOSPITAL_COMMUNITY): Payer: No Typology Code available for payment source | Attending: Oncology

## 2017-05-05 ENCOUNTER — Encounter (HOSPITAL_COMMUNITY): Payer: No Typology Code available for payment source

## 2017-05-05 VITALS — BP 109/66 | HR 81 | Temp 98.3°F | Resp 16 | Wt 174.2 lb

## 2017-05-05 DIAGNOSIS — C50411 Malignant neoplasm of upper-outer quadrant of right female breast: Secondary | ICD-10-CM

## 2017-05-05 DIAGNOSIS — Z170 Estrogen receptor positive status [ER+]: Secondary | ICD-10-CM | POA: Insufficient documentation

## 2017-05-05 LAB — COMPREHENSIVE METABOLIC PANEL
ALBUMIN: 4 g/dL (ref 3.5–5.0)
ALK PHOS: 72 U/L (ref 38–126)
ALT: 37 U/L (ref 14–54)
AST: 33 U/L (ref 15–41)
Anion gap: 12 (ref 5–15)
BILIRUBIN TOTAL: 0.7 mg/dL (ref 0.3–1.2)
BUN: 37 mg/dL — AB (ref 6–20)
CO2: 23 mmol/L (ref 22–32)
Calcium: 10.5 mg/dL — ABNORMAL HIGH (ref 8.9–10.3)
Chloride: 101 mmol/L (ref 101–111)
Creatinine, Ser: 1.66 mg/dL — ABNORMAL HIGH (ref 0.44–1.00)
GFR calc Af Amer: 38 mL/min — ABNORMAL LOW (ref >60)
GFR, EST NON AFRICAN AMERICAN: 33 mL/min — AB (ref >60)
GLUCOSE: 218 mg/dL — AB (ref 65–99)
POTASSIUM: 3.3 mmol/L — AB (ref 3.5–5.1)
Sodium: 136 mmol/L (ref 135–145)
TOTAL PROTEIN: 7.4 g/dL (ref 6.5–8.1)

## 2017-05-05 LAB — CBC WITH DIFFERENTIAL/PLATELET
BASOS ABS: 0 10*3/uL (ref 0.0–0.1)
BASOS PCT: 0 %
Eosinophils Absolute: 0.1 10*3/uL (ref 0.0–0.7)
Eosinophils Relative: 2 %
HCT: 33.4 % — ABNORMAL LOW (ref 36.0–46.0)
Hemoglobin: 11.7 g/dL — ABNORMAL LOW (ref 12.0–15.0)
LYMPHS PCT: 27 %
Lymphs Abs: 1.3 10*3/uL (ref 0.7–4.0)
MCH: 29 pg (ref 26.0–34.0)
MCHC: 35 g/dL (ref 30.0–36.0)
MCV: 82.9 fL (ref 78.0–100.0)
MONO ABS: 0.3 10*3/uL (ref 0.1–1.0)
Monocytes Relative: 6 %
NEUTROS ABS: 3.2 10*3/uL (ref 1.7–7.7)
NEUTROS PCT: 65 %
Platelets: 242 10*3/uL (ref 150–400)
RBC: 4.03 MIL/uL (ref 3.87–5.11)
RDW: 14 % (ref 11.5–15.5)
WBC: 5 10*3/uL (ref 4.0–10.5)

## 2017-05-05 MED ORDER — HEPARIN SOD (PORK) LOCK FLUSH 100 UNIT/ML IV SOLN
500.0000 [IU] | Freq: Once | INTRAVENOUS | Status: AC
  Administered 2017-05-05: 500 [IU] via INTRAVENOUS

## 2017-05-05 MED ORDER — SODIUM CHLORIDE 0.9% FLUSH
10.0000 mL | INTRAVENOUS | Status: DC | PRN
  Administered 2017-05-05: 10 mL via INTRAVENOUS
  Filled 2017-05-05: qty 10

## 2017-05-05 NOTE — Progress Notes (Signed)
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    BRIEF ONCOLOGIC HISTORY:    Breast cancer of upper-outer quadrant of right female breast ([facility] [facility])   06/24/2016 Mammogram    Diagnostic bilateral mammogram: highly suspicious 3.5 cm mass in the UO R breast with marked enlarged R axillary LN      06/24/2016 Breast US    2.7 x 3.1 x 3.5 cm slightly irregular hypoechoic mass at the 10:00 position of right breast, 6 cm from the nipple.  4.1 x 6 cm right axillar lymph node also identified.      07/01/2016 Pathology Results    Infiltrating ductal carcinoma ER+ 80%, PR-, HER 2-, Ki67 90%      07/01/2016 Initial Biopsy    Ultrasound guided R breast biopsy and R axillary LN biopsy both were clipped post biopsy      07/20/2016 Imaging    MUGA- Upper normal left ventricular ejection fraction of 75% with normal LV wall motion.      07/21/2016 Imaging    CT CAP- 2.9 cm right upper outer quadrant breast mass, consistent with known primary breast carcinoma.  Right axillary and subpectoral lymphadenopathy, consistent with metastatic disease.  Tiny sub-cm bilateral pulmonary nodules are indeterminate, but favor postinflammatory etiology over early metastases. Recommend continued attention on follow-up CT.  No evidence of metastatic disease within the abdomen or pelvis.      07/23/2016 Procedure    Port placed by Dr. Barry Dienes      07/24/2016 - 12/04/2016 Neo-Adjuvant Chemotherapy    Adriamycin/Cytoxan x 4, followed by weekly Taxol x 12       07/30/2016 Imaging    Bone scan: Negative for osseous disease .      09/22/2016 Imaging    CT head- 1. Normal CT  of the brain. No evidence of intracranial metastatic disease. 2. Frothy secretions throughout most of the paranasal sinuses. Correlate clinically for acute sinusitis. This could serve as a source of headache.      12/30/2016 Surgery    Right lumpectomy with axillary lymph node dissection by Dr. Arnoldo Morale. Surgical path: 1. Breast, lumpectomy, right - FIBROCYSTIC AND FIBROADENOMATOID CHANGE. - USUAL DUCTAL HYPERPLASIA. - TREATMENT EFFECT. - NO RESIDUAL CARCINOMA IDENTIFIED. - SEE ONCOLOGY TABLE. 2. Lymph nodes, regional resection, right axillary contents - NINE OF NINE LYMPH NODES NEGATIVE FOR CARCINOMA (0/9). - TREATMENT EFFECT. Final stage ypT0 ypN0       INTERVAL HISTORY:  Patient presents today for continued follow-up of her breast cancer.  She received radiation to her breasts from 02/10/17 through 03/26/17.  She tolerated her radiation well.  She started Arimidex on 03/27/2017.  She denies any side effects from Arimidex including hot flashes, arthralgias.  She continues to take calcium and vitamin D as well.  She denies any chest pain, shortness breath, abdominal pain, focal weakness, recent infections, nausea, vomiting, diarrhea.  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  Patient states that her hearing has been decreasing and she is gone for hearing testing recently.   REVIEW OF SYSTEMS:  Review of Systems  Constitutional: Negative.  Negative for chills, fatigue and fever.  HENT:  Negative.  Negative for lump/mass  and nosebleeds.   Eyes: Negative.   Respiratory: Negative for cough and shortness of breath.   Cardiovascular: Negative.  Negative for chest pain and leg swelling.  Gastrointestinal: Negative.  Negative for abdominal pain, blood in stool, constipation, diarrhea, nausea and vomiting.  Endocrine: Negative.   Genitourinary: Negative.  Negative for dysuria and hematuria.   Musculoskeletal: Negative.  Negative for arthralgias.    Skin: Negative.  Negative for rash.  Neurological: Positive for numbness. Negative for dizziness and headaches.  Hematological: Negative.  Negative for adenopathy. Does not bruise/bleed easily.  Psychiatric/Behavioral: Negative.  Negative for depression and sleep disturbance. The patient is not nervous/anxious.      PAST MEDICAL/SURGICAL HISTORY:  Past Medical History:  Diagnosis Date  . Arthritis   . Breast cancer of upper-outer quadrant of right female breast ([place]) 07/14/2016  . Cancer ([place])   . Cough    dry  . Diabetes mellitus without complication ([place])   . Hypertension   . Ruptured eardrum    left    Past Surgical History:  Procedure Laterality Date  . ABDOMINAL HYSTERECTOMY     with bilateral bso  . AXILLARY LYMPH NODE DISSECTION Right 12/30/2016   Procedure: AXILLARY LYMPH NODE DISSECTION;  Surgeon: Aviva Signs, MD;  Location: [facility];  Service: General;  Laterality: Right;  . COLONOSCOPY    . otif wrist Right   . PARTIAL MASTECTOMY WITH NEEDLE LOCALIZATION Right 12/30/2016   Procedure: PARTIAL MASTECTOMY WITH NEEDLE LOCALIZATION;  Surgeon: Aviva Signs, MD;  Location: [facility];  Service: General;  Laterality: Right;  . PORTACATH PLACEMENT Left 07/23/2016   Procedure: INSERTION PORT-A-CATH;  Surgeon: Stark Klein, MD;  Location: [facility];  Service: General;  Laterality: Left;  . ruptured ear drum Left      SOCIAL HISTORY:  Social History   Social History  . Marital status: Single    Spouse name: N/A  . Number of children: N/A  . Years of education: N/A   Occupational History  . Not on file.   Social History Main Topics  . Smoking status: Former Smoker    Packs/day: 0.50    Years: 4.00    Types: Cigarettes  . Smokeless tobacco: Never Used  . Alcohol use No  . Drug use: No  . Sexual activity: Not Currently    Birth control/ protection: Surgical   Other Topics Concern  . Not on file   Social History Narrative  . No narrative on file     FAMILY HISTORY:  No family history on file.  CURRENT MEDICATIONS:  Outpatient Encounter Prescriptions as of 05/05/2017  Medication Sig  . amLODipine (NORVASC) 10 MG tablet Take 10 mg by mouth daily.  [name] amoxicillin-clavulanate (AUGMENTIN) 875-125 MG tablet Take 1 tablet by mouth 2 (two) times daily. 7 day course started 12-19-16 for sinus infection  . anastrozole (ARIMIDEX) 1 MG tablet Take 1 tablet (1 mg total) by mouth daily.  [name] aspirin EC 81 MG tablet Take 81 mg by mouth daily.  [name] atorvastatin (LIPITOR) 10 MG tablet Take 10 mg by mouth daily at 6 PM.   . Calcium Carbonate (CALCIUM 600 PO) Take 600 mg by mouth daily.  . cholecalciferol (VITAMIN D) 1000 units tablet Take 1,000 Units by mouth daily.  [name] glyBURIDE-metformin (GLUCOVANCE) 5-500 MG tablet Take 1 tablet by mouth 2 (two) times daily.  . hydrochlorothiazide (HYDRODIURIL) 25 MG tablet Take 25 mg by mouth daily.  [name] lisinopril (PRINIVIL,ZESTRIL) 40 MG tablet Take 40 mg by  mouth daily.  . Omega-3 Fatty Acids (FISH OIL) 1000 MG CAPS Take 1,000 mg by mouth daily.  [name] oxyCODONE-acetaminophen (PERCOCET) 7.5-325 MG tablet Take 1-2 tablets by mouth every 4 (four) hours as needed.   No facility-administered encounter medications on file as of 05/05/2017.     ALLERGIES:  No Known Allergies   PHYSICAL EXAM:  ECOG Performance status: 0-1 - Symptomatic, but independent.      Physical Exam  Constitutional: She is oriented to person, place, and time and well-developed, well-nourished, and in no distress. No distress.  HENT:  Head: Normocephalic and atraumatic.  Mouth/Throat: Oropharynx is clear and moist. No oropharyngeal exudate.  Eyes: Pupils are equal, round, and reactive to light. Conjunctivae are normal. No scleral icterus.  Neck: Normal range of motion. Neck supple. No JVD present.  Cardiovascular: Normal rate, regular rhythm and normal heart sounds.  Exam reveals no gallop and no friction rub.   No murmur  heard. Pulmonary/Chest: Effort normal and breath sounds normal. No respiratory distress. She has no wheezes. She has no rales.  Abdominal: Soft. Bowel sounds are normal. She exhibits no distension. There is no tenderness. There is no guarding.  Musculoskeletal: Normal range of motion. She exhibits no edema or tenderness.  Lymphadenopathy:    She has no cervical adenopathy.       Right: No supraclavicular adenopathy present.       Left: No supraclavicular adenopathy present.  Neurological: She is alert and oriented to person, place, and time. No cranial nerve deficit. Gait normal.  Skin: Skin is warm and dry. No rash noted. No erythema. No pallor.  Psychiatric: Mood, memory, affect and judgment normal.  Nursing note and vitals reviewed.    LABORATORY DATA:  I have reviewed the labs as listed.  CBC    Component Value Date/Time   WBC 6.3 12/25/2016 1400   RBC 4.27 12/25/2016 1400   HGB 13.3 12/25/2016 1400   HCT 38.2 12/25/2016 1400   PLT 261 12/25/2016 1400   MCV 89.5 12/25/2016 1400   MCH 31.1 12/25/2016 1400   MCHC 34.8 12/25/2016 1400   RDW 12.8 12/25/2016 1400   LYMPHSABS 1.9 12/25/2016 1400   MONOABS 0.4 12/25/2016 1400   EOSABS 0.1 12/25/2016 1400   BASOSABS 0.0 12/25/2016 1400   CMP Latest Ref Rng & Units 12/25/2016 12/04/2016 11/27/2016  Glucose 65 - 99 mg/dL 245(H) 295(H) 374(H)  BUN 6 - 20 mg/dL _0 Creatinine 0.44 - 1.00 mg/dL 0.91 0.83 1.01(H)  Sodium 135 - 145 mmol/L 139 136 131(L)  Potassium 3.5 - 5.1 mmol/L 3.2(L) 3.5 3.8  Chloride 101 - 111 mmol/L 102 102 97(L)  CO2 22 - 32 mmol/L _1 Calcium 8.9 - 10.3 mg/dL 10.3 9.7 9.9  Total Protein 6.5 - 8.1 g/dL 7.0 7.0 6.9  Total Bilirubin 0.3 - 1.2 mg/dL 0.6 0.7 0.9  Alkaline Phos 38 - 126 U/L 90 79 85  AST 15 - 41 U/L _2 ALT 14 - 54 U/L 33 43 29    PENDING LABS:    DIAGNOSTIC IMAGING:  Port-a-cath dye study: 09/04/16     CT brain: 09/21/16    PATHOLOGY:  Right breast and axillary LN  biopsy: 07/01/16       Patient: [name], [name] Collected: 12/30/2016 Client: [facility] Accession: [id number] Received: 12/30/2016 Aviva Signs DOB: [birth year] Age: 21 Gender: F Reported: 01/01/2017 [address] Patient Ph: [phone] MRN #: [id number] Linna Hoff, [place] [address] Visit #:  [id number] Chart #: Phone: [phone] Fax: CC: REPORT OF SURGICAL PATHOLOGY FINAL DIAGNOSIS Diagnosis 1. Breast, lumpectomy, right - FIBROCYSTIC AND FIBROADENOMATOID CHANGE. - USUAL DUCTAL HYPERPLASIA. - TREATMENT EFFECT. - NO RESIDUAL CARCINOMA IDENTIFIED. - SEE ONCOLOGY TABLE. 2. Lymph nodes, regional resection, right axillary contents - NINE OF NINE LYMPH NODES NEGATIVE FOR CARCINOMA (0/9). - TREATMENT EFFECT. Microscopic Comment 1. BREAST, STATUS POST NEOADJUVANT TREATMENT Procedure: Right breast lumpectomy and right axillary dissection. Laterality: Right. Tumor Size: No residual tumor. Histologic Type: Invasive ductal carcinoma (per biopsy [phone]). Grade: 3 Tubular Differentiation: 3 Nuclear Pleomorphism: 3 Mitotic Count: 3 Ductal Carcinoma in Situ (DCIS): Not identified. Regional Lymph Nodes: Number of Lymph Nodes Examined: 9 Number of Sentinel Lymph Nodes Examined: 0 Lymph Nodes with Macrometastases: 0 Lymph Nodes with Micrometastases: 0 Lymph Nodes with Isolated Tumor Cells: 0 Margins: Invasive carcinoma, distance from closest margin: N/A. DCIS, distance from closest margin: N/A. Extent of Tumor: N/A. Breast Prognostic Profile (pre-neoadjuvant case #: [id number]). 1 of 3 FINAL for Rosier, Johnathan L [phone]) Microscopic Comment(continued) Estrogen Receptor: Positive, 80% moderate staining.  Progesterone Receptor: Negative. Her2: Negative (ratio 1.39) Ki-67: 90% Will not be repeated due to lack of tumor. Residual Cancer Burden (RCB): Complete pathologic response. 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 Completed adjuvant RT to right breast. Started Arimidex on 03/27/17.  PLAN: Continue arimidex, patient is tolerating it well. Continue calcium-vitamin D. Will order a baseline DEXA scan. I have ordered for bilateral diagnostic mammogram in 5 months. RTC in 4 months for follow up with labs.  Orders Placed This Encounter  Procedures  . MM DIAG BREAST TOMO BILATERAL    Standing Status:   Future    Standing Expiration Date:   05/05/2018    Order Specific Question:   Reason for Exam (SYMPTOM  OR DIAGNOSIS REQUIRED)    Answer:   6 month mammo post treatment completion    Order Specific Question:   Is the patient pregnant?    Answer:   No    Order Specific Question:   Preferred imaging location?    Answer:   [facility]    Standing Status:   Future    Standing Expiration Date:   06/04/2017    Order Specific Question:   Reason for Exam (SYMPTOM  OR DIAGNOSIS REQUIRED)    Answer:   postmenopausal female on aromatase inhibitor evaluate for osteopenia    Order Specific Question:   Is the patient pregnant?    Answer:   No    Order Specific Question:   Preferred imaging location?    Answer:   [facility]  . CBC with Differential    Standing Status:   Future    Number of Occurrences:   1    Standing Expiration Date:   05/05/2018  . Comprehensive metabolic panel    Standing Status:   Future    Number of Occurrences:   1    Standing Expiration Date:   05/05/2018      All questions were answered to patient's  stated  satisfaction. Encouraged patient to call with any new concerns or questions before her next visit to the cancer center and we can certain see her sooner, if needed.    Twana First, MD

## 2017-05-05 NOTE — Patient Instructions (Signed)
[facility] at [facility] Discharge Instructions  RECOMMENDATIONS MADE BY THE CONSULTANT AND ANY TEST RESULTS WILL BE SENT TO YOUR REFERRING PHYSICIAN.  Portacath flushed per protocol today. Follow-up as scheduled. Call clinic for any questions or concerns  Thank you for choosing [facility] at [facility] to provide your oncology and hematology care.  To afford each patient quality time with our provider, please arrive at least 15 minutes before your scheduled appointment time.    If you have a lab appointment with the Cancer Center please come in thru the  Main Entrance and check in at the main information desk  You need to re-schedule your appointment should you arrive 10 or more minutes late.  We strive to give you quality time with our providers, and arriving late affects you and other patients whose appointments are after yours.  Also, if you no show three or more times for appointments you may be dismissed from the clinic at the providers discretion.     Again, thank you for choosing [facility].  Our hope is that these requests will decrease the amount of time that you wait before being seen by our physicians.       _____________________________________________________________  Should you have questions after your visit to [facility], please contact our office at [phone] between the hours of 8:30 a.m. and 4:30 p.m.  Voicemails left after 4:30 p.m. will not be returned until the following business day.  For prescription refill requests, have your pharmacy contact our office.       Resources For Cancer Patients and their Caregivers ? American Cancer Society: Can assist with transportation, wigs, general needs, runs Look Good Feel Better.        1-888-227-6333 ? Cancer Care: Provides financial assistance, online support groups, medication/co-pay assistance.  1-800-813-HOPE (4673) ? [facility] Assists [place] cancer patients and their families through emotional , educational and financial support.  336-427-4357 ? [place] DSS Where to apply for food stamps, Medicaid and utility assistance. 336-342-1394 ? RCATS: Transportation to medical appointments. 336-347-2287 ? Social Security Administration: May apply for disability if have a Stage IV cancer. 336-342-7796 1-800-772-1213 ? [place] Aging, Disability and Transit Services: Assists with nutrition, care and transit needs. 336-349-2343  Cancer Center Support Programs: @10RELATIVEDAYS@ > Cancer Support Group  2nd Tuesday of the month 1pm-2pm, Journey Room  > Creative Journey  3rd Tuesday of the month 1130am-1pm, Journey Room  > Look Good Feel Better  1st Wednesday of the month 10am-12 noon, Journey Room (Call American Cancer Society to register 1-800-395-5775)   

## 2017-05-05 NOTE — Patient Instructions (Signed)
[place] at [facility] Discharge Instructions  RECOMMENDATIONS MADE BY THE CONSULTANT AND ANY TEST RESULTS WILL BE SENT TO YOUR REFERRING PHYSICIAN.  You were seen today by Dr. Twana First Follow up in 4 months with lab work We will schedule your mammogram in late March next year We will also get a DEXA scan or bone scan next month    Thank you for choosing [place] at [facility] to provide your oncology and hematology care.  To afford each patient quality time with our provider, please arrive at least 15 minutes before your scheduled appointment time.    If you have a lab appointment with the [facility] please come in thru the  Main Entrance and check in at the main information desk  You need to re-schedule your appointment should you arrive 10 or more minutes late.  We strive to give you quality time with our providers, and arriving late affects you and other patients whose appointments are after yours.  Also, if you no show three or more times for appointments you may be dismissed from the clinic at the providers discretion.     Again, thank you for choosing [facility].  Our hope is that these requests will decrease the amount of time that you wait before being seen by our physicians.       _____________________________________________________________  Should you have questions after your visit to [facility], please contact our office at [phone] between the hours of 8:30 a.m. and 4:30 p.m.  Voicemails left after 4:30 p.m. will not be returned until the following business day.  For prescription refill requests, have your pharmacy contact our office.       Resources For Cancer Patients and their Caregivers ? American Cancer Society: Can assist with transportation, wigs, general needs, runs Look Good Feel Better.        458-087-1938 ? Cancer Care: Provides financial assistance, online support  groups, medication/co-pay assistance.  1-800-813-HOPE (435) 773-5590) ? [facility] Assists [place] cancer patients and their families through emotional , educational and financial support.  424-139-2137 ? [place] DSS Where to apply for food stamps, Medicaid and utility assistance. 865-519-0008 ? RCATS: Transportation to medical appointments. (415)516-3601 ? Social Security Administration: May apply for disability if have a Stage IV cancer. 336-677-9201 530 076 6672 ? [employer], Disability and Transit Services: Assists with nutrition, care and transit needs. [place] Support Programs: @10RELATIVEDAYS @ > Cancer Support Group  2nd Tuesday of the month 1pm-2pm, Journey Room  > Creative Journey  3rd Tuesday of the month 1130am-1pm, Journey Room  > Look Good Feel Better  1st Wednesday of the month 10am-12 noon, Journey Room (Call [place] to register [phone])

## 2017-05-05 NOTE — Progress Notes (Signed)
[name] tolerated portacath flush well without complaints or incident. Port accessed with 20 gauge needle without blood return but flushed with 10 ml NS and 5 ml Heparin easily per protocol then de-accessed. Pt denied any discomfort with flushing of port and no swelling noted at site. Pt discharged self ambulatory in satisfactory condition accompanied by family

## 2017-05-20 ENCOUNTER — Ambulatory Visit (HOSPITAL_COMMUNITY)
Admission: RE | Admit: 2017-05-20 | Discharge: 2017-05-20 | Disposition: A | Discharge status: Home / Self Care | Payer: No Typology Code available for payment source | Source: Ambulatory Visit | Attending: Oncology | Admitting: Oncology

## 2017-05-20 DIAGNOSIS — C50411 Malignant neoplasm of upper-outer quadrant of right female breast: Secondary | ICD-10-CM | POA: Insufficient documentation

## 2017-05-20 DIAGNOSIS — Z780 Asymptomatic menopausal state: Secondary | ICD-10-CM | POA: Insufficient documentation

## 2017-05-20 DIAGNOSIS — Z170 Estrogen receptor positive status [ER+]: Secondary | ICD-10-CM | POA: Diagnosis not present

## 2017-05-25 ENCOUNTER — Ambulatory Visit: Payer: No Typology Code available for payment source | Admitting: General Surgery

## 2017-06-01 ENCOUNTER — Ambulatory Visit (INDEPENDENT_AMBULATORY_CARE_PROVIDER_SITE_OTHER): Payer: No Typology Code available for payment source | Admitting: General Surgery

## 2017-06-01 ENCOUNTER — Encounter: Payer: Self-pay | Admitting: General Surgery

## 2017-06-01 ENCOUNTER — Ambulatory Visit: Payer: No Typology Code available for payment source | Admitting: General Surgery

## 2017-06-01 VITALS — BP 122/76 | HR 97 | Temp 96.6°F | Resp 18 | Ht 64.0 in | Wt 177.0 lb

## 2017-06-01 DIAGNOSIS — C50411 Malignant neoplasm of upper-outer quadrant of right female breast: Secondary | ICD-10-CM | POA: Diagnosis not present

## 2017-06-01 NOTE — Progress Notes (Signed)
Subjective:     [name]    Patient was referred back to my care by oncology for treatment of right breast mastitis.  She had undergone radiation therapy to the right breast for breast cancer and had finished irrigating when her whole breast turn red.  She was started on an antibiotic.  She states she just finished the antibiotic yesterday.  She does have slipped and pain in the right breast.  A small superficial collapsed blisters noted along the upper, outer quadrant right breast.  No purulent drainage is noted.  She denies any fevers.  She states that the redness and irritation had decreased over the past week. Objective:    BP 122/76   Pulse 97   Temp (!) 96.6 F (35.9 C)   Resp 18   Ht 5\' 4"  (1.626 m)   Wt 177 lb (80.3 kg)   BMI 30.38 kg/m   General:  alert, cooperative and no distress    The right breast is swollen compared to the left breast.  The skin is somewhat thickened, but not erythematous.  It collapsed below is noted in the upper, outer quadrant of the right breast.  No purulent drainage is noted.  Right breast is somewhat tender to touch.      Assessment:    Radiation-induced right mastitis, right breast cancer, improving    Plan:    Patient instructed to keep the area of skin breakdown on the right breast clean and dry with soap and water.  Triple antibiotic ointment has been given to the patient help treat this twice a day.  Follow-up in my office in 2 weeks.  No reason to reorder the antibiotic this time.

## 2017-06-15 ENCOUNTER — Ambulatory Visit: Payer: No Typology Code available for payment source | Admitting: General Surgery

## 2017-06-17 ENCOUNTER — Ambulatory Visit (INDEPENDENT_AMBULATORY_CARE_PROVIDER_SITE_OTHER): Payer: No Typology Code available for payment source | Admitting: General Surgery

## 2017-06-17 ENCOUNTER — Encounter: Payer: Self-pay | Admitting: General Surgery

## 2017-06-17 VITALS — BP 119/74 | HR 95 | Temp 97.1°F | Ht 64.0 in | Wt 174.0 lb

## 2017-06-17 DIAGNOSIS — C50411 Malignant neoplasm of upper-outer quadrant of right female breast: Secondary | ICD-10-CM

## 2017-06-17 NOTE — Progress Notes (Signed)
Subjective:     [name]    Patient here for follow-up wound care after cellulitis secondary to radiation therapy developed in the right breast.  She states her wound has been draining serous material.  No purulent drainage noted.  It is less tender.  She has had no fevers. Objective:    BP 119/74   Pulse 95   Temp (!) 97.1 F (36.2 C)   Ht 5\' 4"  (1.626 m)   Wt 174 lb (78.9 kg)   BMI 29.87 kg/m   General:  alert, cooperative and no distress    Right breast with less erythema and hardness noted since last exam.  She does have an open wound in the right breast at the   partial mastectomy site. Soft eschar present and this was limitedly debrided.  No purulent drainage noted.     Assessment:    Wound breakdown, right breast after radiation therapy    Plan:    Ideally, I would like to have this wound healed by secondary intention.  As the skin is compromised, she may require operative debridement and closure of the wound.  I did mention this to her.  She would like to see over the weekend how the wound does.  I will see her back in 5 days for follow-up.

## 2017-06-22 ENCOUNTER — Ambulatory Visit (INDEPENDENT_AMBULATORY_CARE_PROVIDER_SITE_OTHER): Payer: No Typology Code available for payment source | Admitting: General Surgery

## 2017-06-22 ENCOUNTER — Encounter: Payer: Self-pay | Admitting: General Surgery

## 2017-06-22 VITALS — BP 124/70 | HR 92 | Temp 96.6°F | Ht 64.0 in | Wt 177.0 lb

## 2017-06-22 DIAGNOSIS — C50411 Malignant neoplasm of upper-outer quadrant of right female breast: Secondary | ICD-10-CM | POA: Diagnosis not present

## 2017-06-22 NOTE — Progress Notes (Signed)
Subjective:     [name]  States that her wounds seem to be getting better with less drainage noted.  She denies any fevers. Objective:    BP 124/70   Pulse 92   Temp (!) 96.6 F (35.9 C)   Ht 5\' 4"  (1.626 m)   Wt 177 lb (80.3 kg)   BMI 30.38 kg/m   General:  alert, cooperative and no distress  Right breast wound seems slightly smaller with clear yellow drainage present.  Necrotic white soft eschar present in wound.     Assessment:    Poor wound healing secondary to radiation therapy    Plan:   I again offered to take her to the operating room for surgical debridement and closure.  Patient continues to be reluctant on going to the operating room at this time as she thinks it is slightly getting better.  It is not infected and thus I am okay with her decision, though I told her that when I see her in 2 weeks, should there be no significant improvement, she should strongly consider letting me proceed with surgery.  She will return to my office in 2 weeks for wound care visit.

## 2017-07-05 ENCOUNTER — Encounter (HOSPITAL_COMMUNITY): Payer: Self-pay

## 2017-07-05 ENCOUNTER — Other Ambulatory Visit: Payer: Self-pay

## 2017-07-05 ENCOUNTER — Encounter (HOSPITAL_COMMUNITY): Payer: No Typology Code available for payment source | Attending: Hematology & Oncology

## 2017-07-05 DIAGNOSIS — C50411 Malignant neoplasm of upper-outer quadrant of right female breast: Secondary | ICD-10-CM | POA: Diagnosis not present

## 2017-07-05 DIAGNOSIS — Z01818 Encounter for other preprocedural examination: Secondary | ICD-10-CM | POA: Insufficient documentation

## 2017-07-05 DIAGNOSIS — Z170 Estrogen receptor positive status [ER+]: Secondary | ICD-10-CM | POA: Insufficient documentation

## 2017-07-05 MED ORDER — SODIUM CHLORIDE 0.9% FLUSH
10.0000 mL | INTRAVENOUS | Status: DC | PRN
  Administered 2017-07-05: 10 mL
  Filled 2017-07-05: qty 10

## 2017-07-05 MED ORDER — ANASTROZOLE 1 MG PO TABS: 1.0000 mg | ORAL_TABLET | Freq: Every day | ORAL | 1 refills | Status: DC

## 2017-07-05 MED ORDER — HEPARIN SOD (PORK) LOCK FLUSH 100 UNIT/ML IV SOLN
500.0000 [IU] | Freq: Once | INTRAVENOUS | Status: AC
  Administered 2017-07-05: 500 [IU] via INTRAVENOUS
  Filled 2017-07-05: qty 5

## 2017-07-05 NOTE — Addendum Note (Signed)
Addended by: Donnie Aho on: 07/05/2017 02:04 PM   Modules accepted: Orders

## 2017-07-05 NOTE — Patient Instructions (Signed)
[facility] at [facility] Discharge Instructions  RECOMMENDATIONS MADE BY THE CONSULTANT AND ANY TEST RESULTS WILL BE SENT TO YOUR REFERRING PHYSICIAN.  Port flush done today. Follow up as scheduled.  Thank you for choosing [facility] at [facility] to provide your oncology and hematology care.  To afford each patient quality time with our provider, please arrive at least 15 minutes before your scheduled appointment time.    If you have a lab appointment with the Cancer Center please come in thru the  Main Entrance and check in at the main information desk  You need to re-schedule your appointment should you arrive 10 or more minutes late.  We strive to give you quality time with our providers, and arriving late affects you and other patients whose appointments are after yours.  Also, if you no show three or more times for appointments you may be dismissed from the clinic at the providers discretion.     Again, thank you for choosing [facility].  Our hope is that these requests will decrease the amount of time that you wait before being seen by our physicians.       _____________________________________________________________  Should you have questions after your visit to [facility], please contact our office at [phone] between the hours of 8:30 a.m. and 4:30 p.m.  Voicemails left after 4:30 p.m. will not be returned until the following business day.  For prescription refill requests, have your pharmacy contact our office.       Resources For Cancer Patients and their Caregivers ? American Cancer Society: Can assist with transportation, wigs, general needs, runs Look Good Feel Better.        1-888-227-6333 ? Cancer Care: Provides financial assistance, online support groups, medication/co-pay assistance.  1-800-813-HOPE (4673) ? [facility] Assists [place] cancer patients and  their families through emotional , educational and financial support.  336-427-4357 ? [place] DSS Where to apply for food stamps, Medicaid and utility assistance. 336-342-1394 ? [employer]: Transportation to medical appointments. 336-347-2287 ? Social Security Administration: May apply for disability if have a Stage IV cancer. 336-342-7796 1-800-772-1213 ? [place] Aging, Disability and Transit Services: Assists with nutrition, care and transit needs. 336-349-2343  Cancer Center Support Programs: [email or web address]@ > Cancer Support Group  2nd Tuesday of the month 1pm-2pm, Journey Room  > Creative Journey  3rd Tuesday of the month 1130am-1pm, Journey Room  > Look Good Feel Better  1st Wednesday of the month 10am-12 noon, Journey Room (Call American Cancer Society to register 1-800-395-5775)   

## 2017-07-05 NOTE — Progress Notes (Signed)
[name] presented for Portacath access and flush. Portacath located left chest wall accessed with  H 20 needle. No blood return and no resistance met Portacath flushed with 38m NS and 500U/541mHeparin and needle removed intact. Procedure without incident. Patient tolerated procedure well.   Treatment given per orders. Patient tolerated it well without problems. Vitals stable and discharged home from clinic ambulatory. Follow up as scheduled.

## 2017-07-08 ENCOUNTER — Ambulatory Visit (INDEPENDENT_AMBULATORY_CARE_PROVIDER_SITE_OTHER): Payer: 59 | Admitting: General Surgery

## 2017-07-08 ENCOUNTER — Encounter: Payer: Self-pay | Admitting: General Surgery

## 2017-07-08 VITALS — BP 112/76 | HR 92 | Temp 96.6°F | Ht 64.0 in | Wt 178.0 lb

## 2017-07-08 DIAGNOSIS — Z09 Encounter for follow-up examination after completed treatment for conditions other than malignant neoplasm: Secondary | ICD-10-CM | POA: Diagnosis not present

## 2017-07-08 NOTE — Progress Notes (Signed)
Subjective:     [name]  Patient states her wound is getting better.  Minimal drainage noted.  No fevers noted. Objective:    BP 112/76   Pulse 92   Temp (!) 96.6 F (35.9 C)   Ht 5\' 4"  (1.626 m)   Wt 178 lb (80.7 kg)   BMI 30.55 kg/m   General:  alert, cooperative and no distress  Right breast wound healing well by secondary intention.  Induration and erythema of the right breast much improved.  Wound approximately 1 cm in size.  Much smaller.     Assessment:    Wound breakdown of right breast secondary to radiation therapy, resolving    Plan:   Continue current wound care.  Follow-up here in 2 weeks.  No need for surgery at this point.

## 2017-07-20 ENCOUNTER — Ambulatory Visit (INDEPENDENT_AMBULATORY_CARE_PROVIDER_SITE_OTHER): Payer: 59 | Admitting: General Surgery

## 2017-07-20 ENCOUNTER — Encounter: Payer: Self-pay | Admitting: General Surgery

## 2017-07-20 VITALS — BP 116/71 | HR 84 | Temp 97.3°F | Ht 64.0 in | Wt 175.0 lb

## 2017-07-20 DIAGNOSIS — C50411 Malignant neoplasm of upper-outer quadrant of right female breast: Secondary | ICD-10-CM | POA: Diagnosis not present

## 2017-07-20 NOTE — H&P (View-Only) (Signed)
Subjective:     [name]  Right breast wound almost healed.  No drainage noted. Objective:    BP 116/71   Pulse 84   Temp (!) 97.3 F (36.3 C)   Ht 5\' 4"  (1.626 m)   Wt 175 lb (79.4 kg)   BMI 30.04 kg/m   General:  alert, cooperative and no distress  Right breast with 5-7 mm superficial wound with eschar present.  No induration or erythema noted.  No drainage noted.     Assessment:    Right breast wound, healed    Plan:   Follow-up as needed.

## 2017-07-20 NOTE — Progress Notes (Signed)
Subjective:     [name]  Right breast wound almost healed.  No drainage noted. Objective:    BP 116/71   Pulse 84   Temp (!) 97.3 F (36.3 C)   Ht 5\' 4"  (1.626 m)   Wt 175 lb (79.4 kg)   BMI 30.04 kg/m   General:  alert, cooperative and no distress  Right breast with 5-7 mm superficial wound with eschar present.  No induration or erythema noted.  No drainage noted.     Assessment:    Right breast wound, healed    Plan:   Follow-up as needed.

## 2017-07-27 ENCOUNTER — Other Ambulatory Visit: Payer: Self-pay | Admitting: General Surgery

## 2017-07-27 ENCOUNTER — Other Ambulatory Visit: Payer: Self-pay | Admitting: Gastroenterology

## 2017-08-04 ENCOUNTER — Other Ambulatory Visit (HOSPITAL_COMMUNITY): Payer: Self-pay | Admitting: Nurse Practitioner

## 2017-08-04 DIAGNOSIS — R930 Abnormal findings on diagnostic imaging of skull and head, not elsewhere classified: Secondary | ICD-10-CM

## 2017-08-04 DIAGNOSIS — R519 Headache, unspecified: Secondary | ICD-10-CM

## 2017-08-04 DIAGNOSIS — R51 Headache: Secondary | ICD-10-CM

## 2017-08-18 ENCOUNTER — Other Ambulatory Visit: Payer: Self-pay

## 2017-08-18 ENCOUNTER — Encounter (HOSPITAL_COMMUNITY): Payer: Self-pay | Admitting: *Deleted

## 2017-08-18 NOTE — Progress Notes (Signed)
Spoke with pt for pre-op call. Pt denies cardiac history, chest pain or sob. Pt is a type 2 diabetic. She doesn't know what her A1C is. States she gets it drawn at [facility]. Pt states her fasting blood sugar is usually between 110-125. She states it's been a little higher recently because she had the flu a week and half ago. States it didn't get any higher than 165. Pt instructed not to take her Glucovance this evening and in the AM. Instructed pt to check her blood sugar in the AM (pt working night shift tonight). If blood sugar is 70 or below, treat with 1/2 cup of clear juice (apple or cranberry) and recheck blood sugar 15 minutes after drinking juice. If blood sugar continues to be 70 or below, call the Short Stay department and ask to speak to a nurse. She voiced understanding  I called [facility] and pt's A1C was 6.3 on 1/219.

## 2017-08-19 ENCOUNTER — Ambulatory Visit (HOSPITAL_COMMUNITY): Payer: 59 | Admitting: Certified Registered Nurse Anesthetist

## 2017-08-19 ENCOUNTER — Ambulatory Visit (HOSPITAL_COMMUNITY)
Admission: AD | Admit: 2017-08-19 | Discharge: 2017-08-19 | Disposition: A | Discharge status: Home / Self Care | Payer: 59 | Source: Ambulatory Visit | Attending: Nurse Practitioner | Admitting: Nurse Practitioner

## 2017-08-19 ENCOUNTER — Encounter (HOSPITAL_COMMUNITY)
Admission: AD | Disposition: A | Discharge status: Home / Self Care | Payer: Self-pay | Source: Ambulatory Visit | Attending: Nurse Practitioner

## 2017-08-19 ENCOUNTER — Encounter (HOSPITAL_COMMUNITY): Payer: Self-pay | Admitting: *Deleted

## 2017-08-19 ENCOUNTER — Ambulatory Visit (HOSPITAL_COMMUNITY)
Admission: RE | Admit: 2017-08-19 | Discharge: 2017-08-19 | Disposition: A | Discharge status: Home / Self Care | Payer: 59 | Source: Ambulatory Visit | Attending: Nurse Practitioner | Admitting: Nurse Practitioner

## 2017-08-19 DIAGNOSIS — Z9010 Acquired absence of unspecified breast and nipple: Secondary | ICD-10-CM | POA: Diagnosis not present

## 2017-08-19 DIAGNOSIS — Z7984 Long term (current) use of oral hypoglycemic drugs: Secondary | ICD-10-CM | POA: Insufficient documentation

## 2017-08-19 DIAGNOSIS — R9082 White matter disease, unspecified: Secondary | ICD-10-CM | POA: Diagnosis not present

## 2017-08-19 DIAGNOSIS — J329 Chronic sinusitis, unspecified: Secondary | ICD-10-CM | POA: Insufficient documentation

## 2017-08-19 DIAGNOSIS — Z8379 Family history of other diseases of the digestive system: Secondary | ICD-10-CM | POA: Diagnosis not present

## 2017-08-19 DIAGNOSIS — R51 Headache: Secondary | ICD-10-CM | POA: Diagnosis present

## 2017-08-19 DIAGNOSIS — Z8249 Family history of ischemic heart disease and other diseases of the circulatory system: Secondary | ICD-10-CM | POA: Insufficient documentation

## 2017-08-19 DIAGNOSIS — Z79899 Other long term (current) drug therapy: Secondary | ICD-10-CM | POA: Diagnosis not present

## 2017-08-19 DIAGNOSIS — I10 Essential (primary) hypertension: Secondary | ICD-10-CM | POA: Diagnosis not present

## 2017-08-19 DIAGNOSIS — Z853 Personal history of malignant neoplasm of breast: Secondary | ICD-10-CM | POA: Insufficient documentation

## 2017-08-19 DIAGNOSIS — E7800 Pure hypercholesterolemia, unspecified: Secondary | ICD-10-CM | POA: Diagnosis not present

## 2017-08-19 DIAGNOSIS — R9402 Abnormal brain scan: Secondary | ICD-10-CM | POA: Insufficient documentation

## 2017-08-19 DIAGNOSIS — E119 Type 2 diabetes mellitus without complications: Secondary | ICD-10-CM | POA: Diagnosis not present

## 2017-08-19 DIAGNOSIS — Z833 Family history of diabetes mellitus: Secondary | ICD-10-CM | POA: Insufficient documentation

## 2017-08-19 DIAGNOSIS — R930 Abnormal findings on diagnostic imaging of skull and head, not elsewhere classified: Secondary | ICD-10-CM

## 2017-08-19 DIAGNOSIS — R519 Headache, unspecified: Secondary | ICD-10-CM

## 2017-08-19 HISTORY — PX: RADIOLOGY WITH ANESTHESIA: SHX6223

## 2017-08-19 LAB — GLUCOSE, CAPILLARY: Glucose-Capillary: 170 mg/dL — ABNORMAL HIGH (ref 65–99)

## 2017-08-19 LAB — BASIC METABOLIC PANEL
Anion gap: 13 (ref 5–15)
BUN: 19 mg/dL (ref 6–20)
CHLORIDE: 103 mmol/L (ref 101–111)
CO2: 24 mmol/L (ref 22–32)
Calcium: 10 mg/dL (ref 8.9–10.3)
Creatinine, Ser: 1.03 mg/dL — ABNORMAL HIGH (ref 0.44–1.00)
GFR calc Af Amer: >60 mL/min (ref >60)
GFR calc non Af Amer: 58 mL/min — ABNORMAL LOW (ref >60)
GLUCOSE: 179 mg/dL — AB (ref 65–99)
POTASSIUM: 3.3 mmol/L — AB (ref 3.5–5.1)
Sodium: 140 mmol/L (ref 135–145)

## 2017-08-19 SURGERY — MRI WITH ANESTHESIA
Anesthesia: General

## 2017-08-19 MED ORDER — SUCCINYLCHOLINE CHLORIDE 200 MG/10ML IV SOSY
PREFILLED_SYRINGE | INTRAVENOUS | Status: DC | PRN
  Administered 2017-08-19: 160 mg via INTRAVENOUS

## 2017-08-19 MED ORDER — PROPOFOL 10 MG/ML IV BOLUS
INTRAVENOUS | Status: DC | PRN
  Administered 2017-08-19: 160 mg via INTRAVENOUS

## 2017-08-19 MED ORDER — LACTATED RINGERS IV SOLN
INTRAVENOUS | Status: DC
  Administered 2017-08-19: 10:00:00 via INTRAVENOUS

## 2017-08-19 MED ORDER — PHENYLEPHRINE HCL 10 MG/ML IJ SOLN
INTRAMUSCULAR | Status: DC | PRN
  Administered 2017-08-19: 25 ug/min via INTRAVENOUS

## 2017-08-19 MED ORDER — DEXAMETHASONE SODIUM PHOSPHATE 10 MG/ML IJ SOLN
INTRAMUSCULAR | Status: DC | PRN
  Administered 2017-08-19: 10 mg via INTRAVENOUS

## 2017-08-19 MED ORDER — MIDAZOLAM HCL 2 MG/2ML IJ SOLN
INTRAMUSCULAR | Status: DC | PRN
  Administered 2017-08-19: 2 mg via INTRAVENOUS

## 2017-08-19 MED ORDER — ONDANSETRON HCL 4 MG/2ML IJ SOLN
INTRAMUSCULAR | Status: DC | PRN
  Administered 2017-08-19: 4 mg via INTRAVENOUS

## 2017-08-19 MED ORDER — LIDOCAINE 2% (20 MG/ML) 5 ML SYRINGE
INTRAMUSCULAR | Status: DC | PRN
  Administered 2017-08-19: 80 mg via INTRAVENOUS

## 2017-08-19 NOTE — Interval H&P Note (Signed)
Anesthesia H&P Update: History and Physical Exam reviewed; patient is OK for planned anesthetic and procedure. ? ?

## 2017-08-19 NOTE — Anesthesia Postprocedure Evaluation (Signed)
Anesthesia Post Note  Patient: [name]  Procedure(s) Performed: MRI WITH ANESTHESIA OF BRAIN WITHOUT CONTRAST (N/A )     Patient location during evaluation: PACU Anesthesia Type: General Level of consciousness: sedated Pain management: pain level controlled Vital Signs Assessment: post-procedure vital signs reviewed and stable Respiratory status: spontaneous breathing and respiratory function stable Cardiovascular status: stable Postop Assessment: no apparent nausea or vomiting Anesthetic complications: no    Last Vitals:  Vitals:   08/19/17 1153 08/19/17 1208  BP: 127/70 118/69  Pulse: 97 92  Resp: (!) 24 20  Temp: 36.5 C   SpO2: 97% 98%    Last Pain:  Vitals:   08/19/17 1153  TempSrc:   PainSc: 0-No pain                 Revecca Nachtigal DANIEL

## 2017-08-19 NOTE — OR Nursing (Signed)
CBG 148 cbg machine not crossing over results into computer system

## 2017-08-19 NOTE — Transfer of Care (Signed)
Immediate Anesthesia Transfer of Care Note  Patient: [name]  Procedure(s) Performed: MRI WITH ANESTHESIA OF BRAIN WITHOUT CONTRAST (N/A )  Patient Location: PACU  Anesthesia Type:General  Level of Consciousness: drowsy and patient cooperative  Airway & Oxygen Therapy: Patient Spontanous Breathing and Patient connected to nasal cannula oxygen  Post-op Assessment: Report given to RN, Post -op Vital signs reviewed and stable and Patient moving all extremities X 4  Post vital signs: Reviewed and stable  Last Vitals:  Vitals:   08/19/17 0919 08/19/17 0945  BP:  133/86  Pulse: 97   Resp: 18   Temp: 36.6 C   SpO2: 100%     Last Pain:  Vitals:   08/19/17 0919  TempSrc: Oral      Patients Stated Pain Goal: 5 (11/13/00 1117)  Complications: No apparent anesthesia complications

## 2017-08-19 NOTE — Anesthesia Preprocedure Evaluation (Signed)
Anesthesia Evaluation  Patient identified by MRN, date of birth, ID band Patient awake  General Assessment Comment:Patient just finished 12 weeks of chemotherapy  Reviewed: Allergy & Precautions, NPO status , Patient's Chart, lab work & pertinent test results  History of Anesthesia Complications Negative for: history of anesthetic complications  Airway Mallampati: I  TM Distance: >3 FB     Dental  (+) Poor Dentition   Pulmonary former smoker,    Pulmonary exam normal        Cardiovascular hypertension, Normal cardiovascular exam Rhythm:Regular Rate:Normal     Neuro/Psych negative neurological ROS  negative psych ROS   GI/Hepatic negative GI ROS, Neg liver ROS,   Endo/Other  diabetes, Well Controlled, Type 2  Renal/GU negative Renal ROS     Musculoskeletal  (+) Arthritis ,   Abdominal Normal abdominal exam  (+)   Peds  Hematology   Anesthesia Other Findings   Reproductive/Obstetrics                             Anesthesia Physical  Anesthesia Plan  ASA: II  Anesthesia Plan: General   Post-op Pain Management:    Induction: Intravenous  PONV Risk Score and Plan: 2 and 3 and Ondansetron, Dexamethasone and Treatment may vary due to age or medical condition  Airway Management Planned: Oral ETT  Additional Equipment:   Intra-op Plan:   Post-operative Plan: Extubation in OR  Informed Consent: I have reviewed the patients History and Physical, chart, labs and discussed the procedure including the risks, benefits and alternatives for the proposed anesthesia with the patient or authorized representative who has indicated his/her understanding and acceptance.     Plan Discussed with:   Anesthesia Plan Comments:         Anesthesia Quick Evaluation

## 2017-08-20 ENCOUNTER — Encounter (HOSPITAL_COMMUNITY): Payer: Self-pay | Admitting: Radiology

## 2017-08-22 LAB — GLUCOSE, CAPILLARY: GLUCOSE-CAPILLARY: 148 mg/dL — AB (ref 65–99)

## 2017-09-02 ENCOUNTER — Ambulatory Visit (HOSPITAL_COMMUNITY): Payer: No Typology Code available for payment source | Admitting: Internal Medicine

## 2017-09-02 ENCOUNTER — Other Ambulatory Visit (HOSPITAL_COMMUNITY): Payer: No Typology Code available for payment source

## 2017-09-09 ENCOUNTER — Ambulatory Visit (HOSPITAL_COMMUNITY)
Admission: RE | Admit: 2017-09-09 | Discharge: 2017-09-09 | Disposition: A | Discharge status: Home / Self Care | Payer: 59 | Source: Ambulatory Visit | Attending: Adult Health | Admitting: Adult Health

## 2017-09-09 ENCOUNTER — Inpatient Hospital Stay (HOSPITAL_COMMUNITY): Payer: 59

## 2017-09-09 ENCOUNTER — Inpatient Hospital Stay (HOSPITAL_COMMUNITY): Payer: 59 | Attending: Internal Medicine | Admitting: Adult Health

## 2017-09-09 ENCOUNTER — Other Ambulatory Visit: Payer: Self-pay

## 2017-09-09 ENCOUNTER — Encounter (HOSPITAL_COMMUNITY): Payer: Self-pay | Admitting: Adult Health

## 2017-09-09 VITALS — BP 124/71 | HR 94 | Temp 98.2°F | Resp 14 | Wt 172.4 lb

## 2017-09-09 DIAGNOSIS — M1990 Unspecified osteoarthritis, unspecified site: Secondary | ICD-10-CM

## 2017-09-09 DIAGNOSIS — Z79811 Long term (current) use of aromatase inhibitors: Secondary | ICD-10-CM | POA: Insufficient documentation

## 2017-09-09 DIAGNOSIS — Z170 Estrogen receptor positive status [ER+]: Secondary | ICD-10-CM | POA: Diagnosis not present

## 2017-09-09 DIAGNOSIS — R5383 Other fatigue: Secondary | ICD-10-CM | POA: Diagnosis not present

## 2017-09-09 DIAGNOSIS — C50411 Malignant neoplasm of upper-outer quadrant of right female breast: Secondary | ICD-10-CM | POA: Insufficient documentation

## 2017-09-09 DIAGNOSIS — R2242 Localized swelling, mass and lump, left lower limb: Secondary | ICD-10-CM

## 2017-09-09 DIAGNOSIS — K5909 Other constipation: Secondary | ICD-10-CM | POA: Insufficient documentation

## 2017-09-09 DIAGNOSIS — Z9221 Personal history of antineoplastic chemotherapy: Secondary | ICD-10-CM | POA: Diagnosis not present

## 2017-09-09 DIAGNOSIS — Z79899 Other long term (current) drug therapy: Secondary | ICD-10-CM | POA: Diagnosis not present

## 2017-09-09 DIAGNOSIS — Z7982 Long term (current) use of aspirin: Secondary | ICD-10-CM | POA: Diagnosis not present

## 2017-09-09 DIAGNOSIS — M25562 Pain in left knee: Secondary | ICD-10-CM | POA: Insufficient documentation

## 2017-09-09 DIAGNOSIS — M1712 Unilateral primary osteoarthritis, left knee: Secondary | ICD-10-CM | POA: Diagnosis not present

## 2017-09-09 DIAGNOSIS — R232 Flushing: Secondary | ICD-10-CM | POA: Diagnosis not present

## 2017-09-09 DIAGNOSIS — I10 Essential (primary) hypertension: Secondary | ICD-10-CM

## 2017-09-09 DIAGNOSIS — Z452 Encounter for adjustment and management of vascular access device: Secondary | ICD-10-CM | POA: Diagnosis not present

## 2017-09-09 DIAGNOSIS — M25862 Other specified joint disorders, left knee: Secondary | ICD-10-CM | POA: Insufficient documentation

## 2017-09-09 DIAGNOSIS — E119 Type 2 diabetes mellitus without complications: Secondary | ICD-10-CM

## 2017-09-09 LAB — CBC WITH DIFFERENTIAL/PLATELET
BASOS ABS: 0 10*3/uL (ref 0.0–0.1)
Basophils Relative: 0 %
EOS PCT: 2 %
Eosinophils Absolute: 0.1 10*3/uL (ref 0.0–0.7)
HEMATOCRIT: 37 % (ref 36.0–46.0)
HEMOGLOBIN: 12.2 g/dL (ref 12.0–15.0)
LYMPHS ABS: 1.2 10*3/uL (ref 0.7–4.0)
LYMPHS PCT: 29 %
MCH: 28.5 pg (ref 26.0–34.0)
MCHC: 33 g/dL (ref 30.0–36.0)
MCV: 86.4 fL (ref 78.0–100.0)
Monocytes Absolute: 0.4 10*3/uL (ref 0.1–1.0)
Monocytes Relative: 9 %
NEUTROS ABS: 2.6 10*3/uL (ref 1.7–7.7)
NEUTROS PCT: 60 %
PLATELETS: 257 10*3/uL (ref 150–400)
RBC: 4.28 MIL/uL (ref 3.87–5.11)
RDW: 12.9 % (ref 11.5–15.5)
WBC: 4.3 10*3/uL (ref 4.0–10.5)

## 2017-09-09 LAB — COMPREHENSIVE METABOLIC PANEL
ALK PHOS: 92 U/L (ref 38–126)
ALT: 23 U/L (ref 14–54)
AST: 22 U/L (ref 15–41)
Albumin: 3.9 g/dL (ref 3.5–5.0)
Anion gap: 12 (ref 5–15)
BUN: 16 mg/dL (ref 6–20)
CALCIUM: 10.3 mg/dL (ref 8.9–10.3)
CHLORIDE: 105 mmol/L (ref 101–111)
CO2: 26 mmol/L (ref 22–32)
CREATININE: 0.93 mg/dL (ref 0.44–1.00)
Glucose, Bld: 163 mg/dL — ABNORMAL HIGH (ref 65–99)
Potassium: 3.7 mmol/L (ref 3.5–5.1)
Sodium: 143 mmol/L (ref 135–145)
Total Bilirubin: 0.8 mg/dL (ref 0.3–1.2)
Total Protein: 7.5 g/dL (ref 6.5–8.1)

## 2017-09-09 MED ORDER — HEPARIN SOD (PORK) LOCK FLUSH 100 UNIT/ML IV SOLN
500.0000 [IU] | Freq: Once | INTRAVENOUS | Status: AC
  Administered 2017-09-09: 500 [IU] via INTRAVENOUS

## 2017-09-09 MED ORDER — SODIUM CHLORIDE 0.9% FLUSH
20.0000 mL | INTRAVENOUS | Status: DC | PRN
  Administered 2017-09-09: 20 mL via INTRAVENOUS
  Filled 2017-09-09: qty 20

## 2017-09-09 NOTE — Progress Notes (Signed)
[name] tolerated portacath flush well without complaints or incident. Port accessed with 20 gauge needle, no blood return noted but port flushed with 20 ml NS and 5 ml Heparin easily per protocol without complaints of discomfort or swelling then de-accessed. Pt discharged self ambulatory in satisfactory condition

## 2017-09-09 NOTE — Patient Instructions (Signed)
[facility] at [facility] Discharge Instructions  You were seen today by Mike Craze, NP   See schedulers up front for appointments    Thank you for choosing [place] at [facility] to provide your oncology and hematology care.  To afford each patient quality time with our provider, please arrive at least 15 minutes before your scheduled appointment time.   If you have a lab appointment with the [facility] please come in thru the  Main Entrance and check in at the main information desk  You need to re-schedule your appointment should you arrive 10 or more minutes late.  We strive to give you quality time with our providers, and arriving late affects you and other patients whose appointments are after yours.  Also, if you no show three or more times for appointments you may be dismissed from the clinic at the providers discretion.     Again, thank you for choosing [facility].  Our hope is that these requests will decrease the amount of time that you wait before being seen by our physicians.       _____________________________________________________________  Should you have questions after your visit to [facility], please contact our office at [phone] between the hours of 8:30 a.m. and 4:30 p.m.  Voicemails left after 4:30 p.m. will not be returned until the following business day.  For prescription refill requests, have your pharmacy contact our office.       Resources For Cancer Patients and their Caregivers ? American Cancer Society: Can assist with transportation, wigs, general needs, runs Look Good Feel Better.        (437) 520-6825 ? Cancer Care: Provides financial assistance, online support groups, medication/co-pay assistance.  1-800-813-HOPE (863) 169-7305) ? [facility] Assists [place] cancer patients and their families through emotional , educational and financial  support.  640-653-7907 ? [place] DSS Where to apply for food stamps, Medicaid and utility assistance. 713-257-1347 ? [employer]: Transportation to medical appointments. 480-585-9777 ? Social Security Administration: May apply for disability if have a Stage IV cancer. 818-881-6421 820-415-2859 ? [employer], Disability and Transit Services: Assists with nutrition, care and transit needs. [place] Support Programs:   > Cancer Support Group  2nd Tuesday of the month 1pm-2pm, Journey Room   > Creative Journey  3rd Tuesday of the month 1130am-1pm, Journey Room

## 2017-09-09 NOTE — Progress Notes (Signed)
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  4.1 x 6 cm right axillar lymph node also identified.      07/01/2016 Pathology Results    Infiltrating ductal carcinoma ER+ 80%, PR-, HER 2-, Ki67 90%      07/01/2016 Initial Biopsy    Ultrasound guided R breast biopsy and R axillary LN biopsy both were clipped post biopsy      07/20/2016 Imaging    MUGA- Upper normal left ventricular ejection fraction of 75% with normal LV wall motion.      07/21/2016 Imaging    CT CAP- 2.9 cm right upper outer quadrant breast mass, consistent with known primary breast carcinoma.  Right axillary and subpectoral lymphadenopathy, consistent with metastatic disease.  Tiny sub-cm bilateral pulmonary nodules are indeterminate, but favor postinflammatory etiology over early metastases. Recommend continued attention on follow-up CT.  No evidence of metastatic disease within the abdomen or pelvis.      07/23/2016 Procedure    Port placed by Dr. Barry Dienes      07/24/2016 - 12/04/2016 Neo-Adjuvant Chemotherapy    Adriamycin/Cytoxan x 4, followed by weekly Taxol x 12       07/30/2016 Imaging    Bone scan: Negative for osseous disease .      09/22/2016 Imaging    CT head- 1. Normal CT of the brain. No evidence of intracranial  metastatic disease. 2. Frothy secretions throughout most of the paranasal sinuses. Correlate clinically for acute sinusitis. This could serve as a source of headache.      12/30/2016 Surgery    Right lumpectomy with axillary lymph node dissection by Dr. Arnoldo Morale. Surgical path: 1. Breast, lumpectomy, right - FIBROCYSTIC AND FIBROADENOMATOID CHANGE. - USUAL DUCTAL HYPERPLASIA. - TREATMENT EFFECT. - NO RESIDUAL CARCINOMA IDENTIFIED. - SEE ONCOLOGY TABLE. 2. Lymph nodes, regional resection, right axillary contents - NINE OF NINE LYMPH NODES NEGATIVE FOR CARCINOMA (0/9). - TREATMENT EFFECT. Final stage ypT0 ypN0       INTERVAL HISTORY:  [name] returns for follow-up and consideration for her next cycle of chemotherapy.   Here today with family.   Overall, she tells me she has been feeling pretty well. Appetite and energy levels both 75%.  Remains on Arimidex. Endorses some arthralgias, particularly to her left knee. It has been hurting worse in the past 2-3 days. She noticed a "knot that popped up" behind her knee; she'd like me to take a look at this today as well.  She continues to have some hot flashes at night, but they are manageable for her. She has some chronic sleep problems as a result. Denies vaginal dryness of vaginal bleeding.   She struggles with chronic constipation. 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  She  is able to button her clothes, sign her name, and perform all ADLs without difficulty.    She continues to have (R) breast pain at times; her nipple is extremely tender. She is happy that the wound she had to her (R) breast after radiation has healed well.  No new wounds or ulcerations noted.    Otherwise, she is doing well and is largely without other complaints.    For fun, she enjoys playing different games on her tablet. She also enjoys spending time with her grandchild.    REVIEW OF SYSTEMS:    Review of Systems  Constitutional: Positive for fatigue. Negative for chills and fever.  HENT:  Negative.   Eyes: Negative.   Respiratory: Negative.   Cardiovascular: Negative.   Gastrointestinal: Positive for constipation.  Endocrine: Positive for hot flashes.  Genitourinary: Negative.    Musculoskeletal: Positive for arthralgias.  Skin: Negative.   Neurological: Positive for numbness.  Hematological: Negative.   Psychiatric/Behavioral: Negative.      PAST MEDICAL/SURGICAL HISTORY:  Past Medical History:  Diagnosis Date  . Arthritis   . Breast cancer of upper-outer quadrant of right female breast ([facility]) 07/14/2016  . Cancer ([facility])   . Cough    dry  . Diabetes mellitus without complication ([facility])   . Hypertension   . Ruptured eardrum    left    Past Surgical History:  Procedure Laterality Date  . ABDOMINAL HYSTERECTOMY     with bilateral bso  . AXILLARY LYMPH NODE DISSECTION Right 12/30/2016   Procedure: AXILLARY LYMPH NODE DISSECTION;  Surgeon: Aviva Signs, MD;  Location: [facility];  Service: General;  Laterality: Right;  . COLONOSCOPY    . otif wrist Right   . PARTIAL MASTECTOMY WITH NEEDLE LOCALIZATION Right 12/30/2016   Procedure: PARTIAL MASTECTOMY WITH NEEDLE LOCALIZATION;  Surgeon: Aviva Signs, MD;  Location: [facility];  Service: General;  Laterality: Right;  . PORTACATH PLACEMENT Left 07/23/2016   Procedure: INSERTION PORT-A-CATH;  Surgeon: Stark Klein, MD;  Location: [facility];  Service: General;  Laterality: Left;  . RADIOLOGY WITH ANESTHESIA N/A 08/19/2017   Procedure: MRI WITH ANESTHESIA OF BRAIN WITHOUT CONTRAST;  Surgeon: Radiologist, Medication, MD;  Location: [facility];  Service: Radiology;  Laterality: N/A;  . ruptured ear drum Left      SOCIAL HISTORY:  Social History   Socioeconomic History  . Marital status: Single    Spouse name: Not on file  . Number of children: Not on file  . Years of education: Not on file  . Highest education  level: Not on file  Social Needs  . Financial resource strain: Not on file  . Food insecurity - worry: Not on file  . Food insecurity - inability: Not on file  . Transportation needs - medical: Not on file  . Transportation needs - non-medical: Not on file  Occupational History  . Not on file  Tobacco Use  . Smoking status: Former Smoker    Packs/day: 0.50    Years: 4.00    Pack years: 2.00    Types: Cigarettes  . Smokeless tobacco: Never Used  Substance and Sexual Activity  . Alcohol use: No  . Drug use: No  . Sexual activity: Not Currently    Birth control/protection: Surgical  Other Topics Concern  . Not on file  Social History Narrative  . Not on file    FAMILY HISTORY:  Family History  Problem Relation Age of Onset  . Heart disease Mother   .  Liver disease Father     CURRENT MEDICATIONS:  Outpatient Encounter Medications as of 09/09/2017  Medication Sig  . acetaminophen (TYLENOL) 500 MG tablet Take 1,000 mg by mouth every 6 (six) hours as needed for moderate pain or headache.  [name] amLODipine (NORVASC) 10 MG tablet Take 10 mg by mouth daily.  [name] anastrozole (ARIMIDEX) 1 MG tablet Take 1 tablet (1 mg total) by mouth daily.  [name] aspirin EC 81 MG tablet Take 81 mg by mouth daily.  [name] atorvastatin (LIPITOR) 10 MG tablet Take 10 mg by mouth daily at 6 PM.   . cholecalciferol (VITAMIN D) 1000 units tablet Take 1,000 Units by mouth daily.  . fluticasone (FLONASE) 50 MCG/ACT nasal spray Place 2 sprays into both nostrils daily as needed for allergies or rhinitis.  [name] glyBURIDE-metformin (GLUCOVANCE) 5-500 MG tablet Take 1 tablet by mouth 2 (two) times daily.  . hydrochlorothiazide (HYDRODIURIL) 25 MG tablet Take 25 mg by mouth daily.  [name] lisinopril (PRINIVIL,ZESTRIL) 40 MG tablet Take 40 mg by mouth daily.   No facility-administered encounter medications on file as of 09/09/2017.     ALLERGIES:  No Known Allergies   PHYSICAL EXAM:  ECOG Performance status: 0-1 - Symptomatic,  but independent.   Vitals:   09/09/17 1443  BP: 124/71  Pulse: 94  Resp: 14  Temp: 98.2 F (36.8 C)  SpO2: 97%   Filed Weights   09/09/17 1443  Weight: 172 lb 6.4 oz (78.2 kg)      Physical Exam  Constitutional: She is oriented to person, place, and time and well-developed, well-nourished, and in no distress.  HENT:  Head: Normocephalic.  Mouth/Throat: Oropharynx is clear and moist. No oropharyngeal exudate.  Eyes: Conjunctivae are normal. Pupils are equal, round, and reactive to light. No scleral icterus.  Neck: Normal range of motion. Neck supple.  Cardiovascular: Normal rate and regular rhythm.  Pulmonary/Chest: Effort normal and breath sounds normal. No respiratory distress.    Abdominal: Soft. Bowel sounds are normal. There is no tenderness.  Musculoskeletal: Normal range of motion. She exhibits edema (L) knee edema.  Posterior (L) knee with palpable firm mass ~4 cm. No pain on palpation (pt complains of pain to anterior knee). No LE edema, calf edema, or erythema.   Lymphadenopathy:    She has no cervical adenopathy.       Right: No supraclavicular adenopathy present.       Left: No supraclavicular adenopathy present.  Neurological: She is alert and oriented to person, place, and time. No cranial nerve deficit. Gait normal.  Skin: Skin is warm and dry. No rash noted.  Psychiatric: Mood, memory, affect and judgment normal.  Nursing note and vitals reviewed.    LABORATORY DATA:  I have reviewed the labs as listed.  CBC    Component Value Date/Time   WBC 5.0 05/05/2017 1602   RBC 4.03 05/05/2017 1602   HGB 11.7 (L) 05/05/2017 1602   HCT 33.4 (L) 05/05/2017 1602   PLT 242 05/05/2017 1602   MCV 82.9 05/05/2017 1602   MCH 29.0 05/05/2017 1602   MCHC 35.0 05/05/2017 1602   RDW 14.0 05/05/2017 1602   LYMPHSABS 1.3 05/05/2017 1602   MONOABS 0.3 05/05/2017 1602   EOSABS 0.1 05/05/2017 1602   BASOSABS 0.0 05/05/2017 1602   CMP Latest Ref Rng & Units 08/19/2017  05/05/2017 12/25/2016  Glucose 65 - 99 mg/dL 179(H) 218(H) 245(H)  BUN 6 - 20 mg/dL 19 37(H) 12  Creatinine 0.44 - 1.00 mg/dL 1.03(H)  1.66(H) 0.91  Sodium 135 - 145 mmol/L 140 136 139  Potassium 3.5 - 5.1 mmol/L 3.3(L) 3.3(L) 3.2(L)  Chloride 101 - 111 mmol/L 103 101 102  CO2 22 - 32 mmol/L 24 23 23   Calcium 8.9 - 10.3 mg/dL 10.0 10.5(H) 10.3  Total Protein 6.5 - 8.1 g/dL - 7.4 7.0  Total Bilirubin 0.3 - 1.2 mg/dL - 0.7 0.6  Alkaline Phos 38 - 126 U/L - 72 90  AST 15 - 41 U/L - 33 26  ALT 14 - 54 U/L - 37 33    PENDING LABS:    DIAGNOSTIC IMAGING:      PATHOLOGY:  Right breast and axillary LN biopsy: 07/01/16        ASSESSMENT & PLAN:   Stage IIB (T2N1M0) invasive ductal carcinoma (R) breast, ER+/PR-/HER2-: -Remains on Arimidex with good tolerance. Will plan to continue for 5-10 years (through at least 03/2022). Can consider ordering Breast Cancer Index (BCI) genomic testing in the future to see if she may benefit from extension of anti-estrogen therapy beyond 5 years; will defer to future visits to discuss/order this for now.  -Diagnostic mammogram scheduled for 10/05/17.  -Clinical breast exam performed today. There is a large area of possible fibrosis s/p breast wound during treatment. Her mammogram is scheduled for 10/05/17; will see if we can get this appointment sooner for further evaluation.   -Return to cancer center in 4 months for follow-up with labs.   Bone health:  -Last DEXA scan on 05/20/17 normal with T-score -0.6.  -Reiterated that Arimidex can decrease bone density over time. Recommend continued calcium/vitamin D supplementation and increase weight-bearing exercises as tolerated.   (L) knee pain:  -Started ~2 days ago per patient. There is firm palpable mass to posterior knee measuring ~4 cm.  Discussed patient's case with Dr. Aviva Signs via phone. He recommended we obtain plain film X-ray and LE ultrasound, as well as referral to orthopedics for further  evaluation.  These orders were placed today accordingly.    Port-a-cath maintenance:  -Explained to pt that we generally like to keep port in place for ~1 year after completion of chemotherapy. For [name], that will be sometime this summer.  If all remains well at her next follow-up visit in 4 months, then we could refer her to Dr. Arnoldo Morale for port removal at that time.         Dispo:  -Plain film (L) knee X-ray today.  -(L) LE ultrasound for posterior knee mass and knee pain; orders placed today.  -Port flush every 2 months.  -Return to cancer center in 4 months for follow-up with labs. (CBC, BMP)     All questions were answered to patient's stated satisfaction. Encouraged patient to call with any new concerns or questions before her next visit to the cancer center and we can certain see her sooner, if needed.        Orders placed this encounter:  Orders Placed This Encounter  Procedures  . DG Knee Complete 4 Views Left  . [place] LT LOWER EXTREM LTD SOFT TISSUE NON VASCULAR  . CBC with Differential/Platelet  . Basic metabolic panel      Christine Craze, [name] [place] [phone]

## 2017-09-09 NOTE — Patient Instructions (Signed)
[place] at [facility] Discharge Instructions  Portacath flush per protocol today. Follow-up as scheduled. Call clinic for any questions or concerns   Thank you for choosing [facility] at [facility] to provide your oncology and hematology care.  To afford each patient quality time with our provider, please arrive at least 15 minutes before your scheduled appointment time.   If you have a lab appointment with the [facility] please come in thru the  Main Entrance and check in at the main information desk  You need to re-schedule your appointment should you arrive 10 or more minutes late.  We strive to give you quality time with our providers, and arriving late affects you and other patients whose appointments are after yours.  Also, if you no show three or more times for appointments you may be dismissed from the clinic at the providers discretion.     Again, thank you for choosing [facility].  Our hope is that these requests will decrease the amount of time that you wait before being seen by our physicians.       _____________________________________________________________  Should you have questions after your visit to [facility], please contact our office at [phone] between the hours of 8:30 a.m. and 4:30 p.m.  Voicemails left after 4:30 p.m. will not be returned until the following business day.  For prescription refill requests, have your pharmacy contact our office.       Resources For Cancer Patients and their Caregivers ? American Cancer Society: Can assist with transportation, wigs, general needs, runs Look Good Feel Better.        564-826-6907 ? Cancer Care: Provides financial assistance, online support groups, medication/co-pay assistance.  1-800-813-HOPE (814)027-5978) ? [facility] Assists [place] cancer patients and their families through emotional , educational and  financial support.  581-128-3166 ? [place] DSS Where to apply for food stamps, Medicaid and utility assistance. 412 082 8002 ? [facility]: Transportation to medical appointments. 409-249-8151 ? Social Security Administration: May apply for disability if have a Stage IV cancer. ([phone] 323 883 0567 ? [employer], Disability and Transit Services: Assists with nutrition, care and transit needs. [place] Support Programs:   > Cancer Support Group  2nd Tuesday of the month 1pm-2pm, Journey Room   > Creative Journey  3rd Tuesday of the month 1130am-1pm, Journey Room

## 2017-09-13 ENCOUNTER — Encounter (HOSPITAL_COMMUNITY): Payer: Self-pay | Admitting: Adult Health

## 2017-09-13 ENCOUNTER — Other Ambulatory Visit: Payer: Self-pay | Admitting: Oncology

## 2017-09-13 DIAGNOSIS — Z853 Personal history of malignant neoplasm of breast: Secondary | ICD-10-CM

## 2017-09-15 ENCOUNTER — Ambulatory Visit (HOSPITAL_COMMUNITY)
Admission: RE | Admit: 2017-09-15 | Discharge: 2017-09-15 | Disposition: A | Discharge status: Home / Self Care | Payer: 59 | Source: Ambulatory Visit | Attending: Adult Health | Admitting: Adult Health

## 2017-09-15 DIAGNOSIS — M7122 Synovial cyst of popliteal space [Baker], left knee: Secondary | ICD-10-CM | POA: Insufficient documentation

## 2017-09-15 DIAGNOSIS — M25862 Other specified joint disorders, left knee: Secondary | ICD-10-CM | POA: Diagnosis present

## 2017-09-15 DIAGNOSIS — R2242 Localized swelling, mass and lump, left lower limb: Secondary | ICD-10-CM

## 2017-09-15 DIAGNOSIS — M25562 Pain in left knee: Secondary | ICD-10-CM

## 2017-09-16 ENCOUNTER — Other Ambulatory Visit (HOSPITAL_COMMUNITY): Payer: Self-pay

## 2017-09-16 DIAGNOSIS — M7120 Synovial cyst of popliteal space [Baker], unspecified knee: Secondary | ICD-10-CM

## 2017-09-21 ENCOUNTER — Other Ambulatory Visit: Payer: Self-pay | Admitting: Oncology

## 2017-09-21 ENCOUNTER — Ambulatory Visit (HOSPITAL_COMMUNITY)
Admission: RE | Admit: 2017-09-21 | Discharge: 2017-09-21 | Disposition: A | Discharge status: Home / Self Care | Payer: 59 | Source: Ambulatory Visit | Attending: Oncology | Admitting: Oncology

## 2017-09-21 ENCOUNTER — Ambulatory Visit (HOSPITAL_COMMUNITY)
Admission: RE | Admit: 2017-09-21 | Discharge: 2017-09-21 | Disposition: A | Discharge status: Home / Self Care | Payer: 59 | Source: Ambulatory Visit | Attending: Adult Health | Admitting: Adult Health

## 2017-09-21 DIAGNOSIS — Z853 Personal history of malignant neoplasm of breast: Secondary | ICD-10-CM | POA: Diagnosis present

## 2017-09-21 DIAGNOSIS — C50411 Malignant neoplasm of upper-outer quadrant of right female breast: Secondary | ICD-10-CM

## 2017-09-21 DIAGNOSIS — Z170 Estrogen receptor positive status [ER+]: Secondary | ICD-10-CM

## 2017-09-21 DIAGNOSIS — Z08 Encounter for follow-up examination after completed treatment for malignant neoplasm: Secondary | ICD-10-CM | POA: Insufficient documentation

## 2017-09-21 DIAGNOSIS — N6489 Other specified disorders of breast: Secondary | ICD-10-CM | POA: Diagnosis not present

## 2017-10-05 ENCOUNTER — Encounter (HOSPITAL_COMMUNITY): Payer: No Typology Code available for payment source

## 2017-10-19 ENCOUNTER — Encounter: Payer: Self-pay | Admitting: Orthopedic Surgery

## 2017-10-23 IMAGING — NM NM BONE WHOLE BODY
2 series · 2 of 2 positions shown · non-contrast
Comparison: None

Correlation: CT chest abdomen pelvis 07/21/2016

CLINICAL DATA: RIGHT breast cancer at upper outer quadrant,
unspecified estrogen receptor status, assess for osseous metastasis

EXAM:
NUCLEAR MEDICINE WHOLE BODY BONE SCAN
TECHNIQUE: Whole body anterior and posterior images were obtained approximately
3 hours after intravenous injection of radiopharmaceutical.
RADIOPHARMACEUTICALS:  20 mCi Cechnetium-DDm MDP IV

[Series 1: wbr_bone_40 whole body · 2.66mm/px · 1 of 1 slices shown (1 of 2)]
[im 1/1]
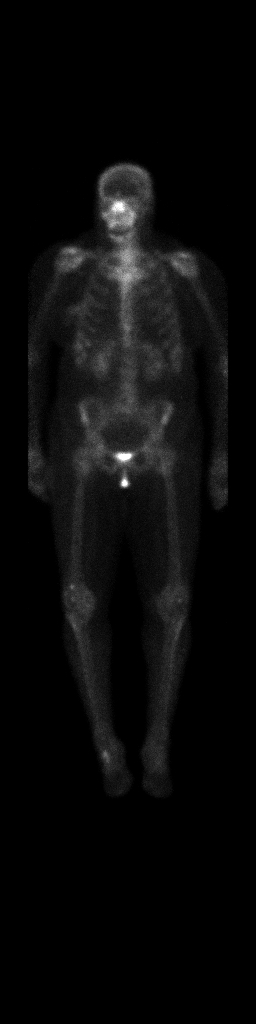

[Series 1: wbr_bone_40 whole body · 2.66mm/px · 1 of 1 slices shown (2 of 2)]
[im 1/1]
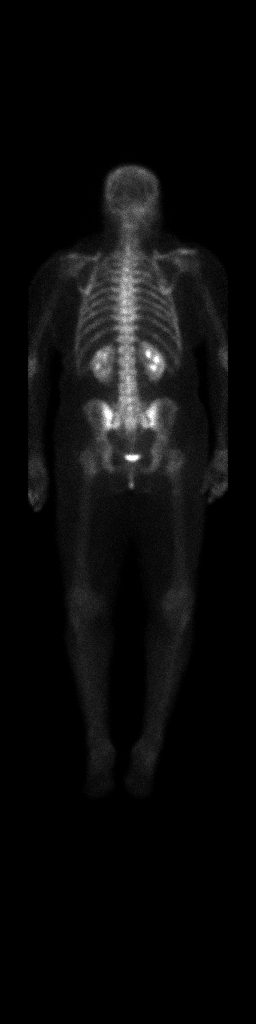

[2 of 2 positions shown; findings below may reference images not displayed]

FINDINGS: Minimally increased uptake at shoulders, LEFT elbow, knees, and
RIGHT foot, typically degenerative.

No abnormal osseous tracer accumulation identified which is
suspicious for osseous metastatic disease.

Expected urinary tract and soft tissue distribution of tracer.
IMPRESSION: No scintigraphic evidence of osseous metastatic disease.

## 2017-11-09 ENCOUNTER — Encounter (HOSPITAL_COMMUNITY): Payer: 59

## 2017-12-01 ENCOUNTER — Encounter (HOSPITAL_COMMUNITY): Payer: Self-pay

## 2017-12-01 ENCOUNTER — Inpatient Hospital Stay (HOSPITAL_COMMUNITY): Payer: 59 | Attending: Internal Medicine

## 2017-12-01 DIAGNOSIS — Z170 Estrogen receptor positive status [ER+]: Secondary | ICD-10-CM | POA: Insufficient documentation

## 2017-12-01 DIAGNOSIS — Z9221 Personal history of antineoplastic chemotherapy: Secondary | ICD-10-CM | POA: Insufficient documentation

## 2017-12-01 DIAGNOSIS — C50411 Malignant neoplasm of upper-outer quadrant of right female breast: Secondary | ICD-10-CM | POA: Insufficient documentation

## 2017-12-01 DIAGNOSIS — Z79811 Long term (current) use of aromatase inhibitors: Secondary | ICD-10-CM | POA: Diagnosis not present

## 2017-12-01 MED ORDER — SODIUM CHLORIDE 0.9% FLUSH
10.0000 mL | INTRAVENOUS | Status: DC | PRN
  Administered 2017-12-01: 10 mL via INTRAVENOUS
  Filled 2017-12-01: qty 10

## 2017-12-01 MED ORDER — HEPARIN SOD (PORK) LOCK FLUSH 100 UNIT/ML IV SOLN
500.0000 [IU] | Freq: Once | INTRAVENOUS | Status: AC
  Administered 2017-12-01: 500 [IU] via INTRAVENOUS

## 2017-12-01 NOTE — Patient Instructions (Signed)
[facility] at [facility] Discharge Instructions  Portacath flushed per protocol today. Follow-up as scheduled. Call clinic for any questions or concerns   Thank you for choosing [facility] at [facility] to provide your oncology and hematology care.  To afford each patient quality time with our provider, please arrive at least 15 minutes before your scheduled appointment time.   If you have a lab appointment with the Cancer Center please come in thru the  Main Entrance and check in at the main information desk  You need to re-schedule your appointment should you arrive 10 or more minutes late.  We strive to give you quality time with our providers, and arriving late affects you and other patients whose appointments are after yours.  Also, if you no show three or more times for appointments you may be dismissed from the clinic at the providers discretion.     Again, thank you for choosing [facility].  Our hope is that these requests will decrease the amount of time that you wait before being seen by our physicians.       _____________________________________________________________  Should you have questions after your visit to  Cancer Center, please contact our office at [phone] between the hours of 8:30 a.m. and 4:30 p.m.  Voicemails left after 4:30 p.m. will not be returned until the following business day.  For prescription refill requests, have your pharmacy contact our office.       Resources For Cancer Patients and their Caregivers ? American Cancer Society: Can assist with transportation, wigs, general needs, runs Look Good Feel Better.        1-888-227-6333 ? Cancer Care: Provides financial assistance, online support groups, medication/co-pay assistance.  1-800-813-HOPE (4673) ? [facility] Assists [place] cancer patients and their families through emotional , educational and  financial support.  336-427-4357 ? [place] DSS Where to apply for food stamps, Medicaid and utility assistance. 336-342-1394 ? [facility]: Transportation to medical appointments. 336-347-2287 ? Social Security Administration: May apply for disability if have a Stage IV cancer. 336-342-7796 1-800-772-1213 ? [place] [employer], Disability and Transit Services: Assists with nutrition, care and transit needs. 336-349-2343  Cancer Center Support Programs:   > Cancer Support Group  2nd Tuesday of the month 1pm-2pm, Journey Room   > Creative Journey  3rd Tuesday of the month 1130am-1pm, Journey Room     

## 2017-12-01 NOTE — Progress Notes (Signed)
[name] tolerated portacath flush well without complaints or incident. Port accessed with 20 gauge needle with blood return noted then flushed with 10 ml NS and 5 ml Heparin easily per protocol then de-accessed. VSS Pt discharged self ambulatory in satisfactory condition accompanied by family member

## 2017-12-29 ENCOUNTER — Other Ambulatory Visit (HOSPITAL_COMMUNITY): Payer: Self-pay | Admitting: Oncology

## 2018-01-03 ENCOUNTER — Inpatient Hospital Stay (HOSPITAL_COMMUNITY): Payer: 59 | Attending: Hematology

## 2018-01-03 DIAGNOSIS — Z170 Estrogen receptor positive status [ER+]: Secondary | ICD-10-CM | POA: Insufficient documentation

## 2018-01-03 DIAGNOSIS — G4762 Sleep related leg cramps: Secondary | ICD-10-CM | POA: Diagnosis not present

## 2018-01-03 DIAGNOSIS — Z87891 Personal history of nicotine dependence: Secondary | ICD-10-CM | POA: Diagnosis not present

## 2018-01-03 DIAGNOSIS — Z9221 Personal history of antineoplastic chemotherapy: Secondary | ICD-10-CM | POA: Insufficient documentation

## 2018-01-03 DIAGNOSIS — C50411 Malignant neoplasm of upper-outer quadrant of right female breast: Secondary | ICD-10-CM | POA: Insufficient documentation

## 2018-01-03 DIAGNOSIS — Z79811 Long term (current) use of aromatase inhibitors: Secondary | ICD-10-CM | POA: Diagnosis not present

## 2018-01-03 LAB — BASIC METABOLIC PANEL
ANION GAP: 8 (ref 5–15)
BUN: 33 mg/dL — ABNORMAL HIGH (ref 6–20)
CHLORIDE: 107 mmol/L (ref 98–111)
CO2: 26 mmol/L (ref 22–32)
Calcium: 9.8 mg/dL (ref 8.9–10.3)
Creatinine, Ser: 1.28 mg/dL — ABNORMAL HIGH (ref 0.44–1.00)
GFR calc Af Amer: 52 mL/min — ABNORMAL LOW (ref >60)
GFR, EST NON AFRICAN AMERICAN: 45 mL/min — AB (ref >60)
GLUCOSE: 130 mg/dL — AB (ref 70–99)
POTASSIUM: 3.6 mmol/L (ref 3.5–5.1)
Sodium: 141 mmol/L (ref 135–145)

## 2018-01-03 LAB — CBC WITH DIFFERENTIAL/PLATELET
BASOS ABS: 0 10*3/uL (ref 0.0–0.1)
Basophils Relative: 0 %
Eosinophils Absolute: 0.1 10*3/uL (ref 0.0–0.7)
Eosinophils Relative: 2 %
HEMATOCRIT: 34.6 % — AB (ref 36.0–46.0)
Hemoglobin: 11.4 g/dL — ABNORMAL LOW (ref 12.0–15.0)
LYMPHS PCT: 35 %
Lymphs Abs: 1.8 10*3/uL (ref 0.7–4.0)
MCH: 29.1 pg (ref 26.0–34.0)
MCHC: 32.9 g/dL (ref 30.0–36.0)
MCV: 88.3 fL (ref 78.0–100.0)
MONO ABS: 0.3 10*3/uL (ref 0.1–1.0)
Monocytes Relative: 6 %
NEUTROS ABS: 2.8 10*3/uL (ref 1.7–7.7)
Neutrophils Relative %: 57 %
Platelets: 227 10*3/uL (ref 150–400)
RBC: 3.92 MIL/uL (ref 3.87–5.11)
RDW: 12.8 % (ref 11.5–15.5)
WBC: 5 10*3/uL (ref 4.0–10.5)

## 2018-01-10 ENCOUNTER — Other Ambulatory Visit (HOSPITAL_COMMUNITY): Payer: 59

## 2018-01-10 ENCOUNTER — Other Ambulatory Visit: Payer: Self-pay

## 2018-01-10 ENCOUNTER — Inpatient Hospital Stay (HOSPITAL_BASED_OUTPATIENT_CLINIC_OR_DEPARTMENT_OTHER): Payer: 59 | Admitting: Hematology

## 2018-01-10 ENCOUNTER — Encounter (HOSPITAL_COMMUNITY): Payer: Self-pay | Admitting: Hematology

## 2018-01-10 VITALS — BP 124/72 | HR 93 | Temp 98.8°F | Resp 16

## 2018-01-10 DIAGNOSIS — Z9221 Personal history of antineoplastic chemotherapy: Secondary | ICD-10-CM

## 2018-01-10 DIAGNOSIS — G4762 Sleep related leg cramps: Secondary | ICD-10-CM | POA: Diagnosis not present

## 2018-01-10 DIAGNOSIS — C50411 Malignant neoplasm of upper-outer quadrant of right female breast: Secondary | ICD-10-CM | POA: Diagnosis not present

## 2018-01-10 DIAGNOSIS — Z87891 Personal history of nicotine dependence: Secondary | ICD-10-CM

## 2018-01-10 DIAGNOSIS — Z170 Estrogen receptor positive status [ER+]: Secondary | ICD-10-CM

## 2018-01-10 DIAGNOSIS — Z79811 Long term (current) use of aromatase inhibitors: Secondary | ICD-10-CM

## 2018-01-10 MED ORDER — ANASTROZOLE 1 MG PO TABS: 1.0000 mg | ORAL_TABLET | Freq: Every day | ORAL | 3 refills | Status: DC

## 2018-01-10 NOTE — Progress Notes (Deleted)
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  4.1 x 6 cm right axillar lymph node also identified.      07/01/2016 Pathology Results    Infiltrating ductal carcinoma ER+ 80%, PR-, HER 2-, Ki67 90%      07/01/2016 Initial Biopsy    Ultrasound guided R breast biopsy and R axillary LN biopsy both were clipped post biopsy      07/20/2016 Imaging    MUGA- Upper normal left ventricular ejection fraction of 75% with normal LV wall motion.      07/21/2016 Imaging    CT CAP- 2.9 cm right upper outer quadrant breast mass, consistent with known primary breast carcinoma.  Right axillary and subpectoral lymphadenopathy, consistent with metastatic disease.  Tiny sub-cm bilateral pulmonary nodules are indeterminate, but favor postinflammatory etiology over early metastases. Recommend continued attention on follow-up CT.  No evidence of metastatic disease within the abdomen or pelvis.      07/23/2016 Procedure    Port placed by Dr. Barry Dienes      07/24/2016 - 12/04/2016 Neo-Adjuvant Chemotherapy    Adriamycin/Cytoxan x 4, followed by weekly Taxol x 12       07/30/2016 Imaging    Bone scan: Negative for osseous disease .      09/22/2016 Imaging    CT head- 1. Normal CT of the brain. No evidence of intracranial  metastatic disease. 2. Frothy secretions throughout most of the paranasal sinuses. Correlate clinically for acute sinusitis. This could serve as a source of headache.      12/30/2016 Surgery    Right lumpectomy with axillary lymph node dissection by Dr. Arnoldo Morale. Surgical path: 1. Breast, lumpectomy, right - FIBROCYSTIC AND FIBROADENOMATOID CHANGE. - USUAL DUCTAL HYPERPLASIA. - TREATMENT EFFECT. - NO RESIDUAL CARCINOMA IDENTIFIED. - SEE ONCOLOGY TABLE. 2. Lymph nodes, regional resection, right axillary contents - NINE OF NINE LYMPH NODES NEGATIVE FOR CARCINOMA (0/9). - TREATMENT EFFECT. Final stage ypT0 ypN0        CANCER STAGING: Cancer Staging Breast cancer of upper-outer quadrant of right female breast [facility]) Staging form: Breast, AJCC 8th Edition - Clinical stage from 08/07/2016: No Stage Recommended (ycT2, cN1(sn), cM0, G3, ER: Positive, PR: Negative, HER2: Negative) - Signed by Baird Cancer, PA-C on 08/07/2016    INTERVAL HISTORY:  [name] 62 y.o. female returns for routine follow-up for right breast cancer.     Overall, she tells me she has been feeling pretty well. Energy levels ***; appetite ***.       REVIEW OF SYSTEMS:  Review of Systems - Oncology   PAST MEDICAL/SURGICAL HISTORY:  Past Medical History:  Diagnosis Date  . Arthritis   . Breast cancer of upper-outer quadrant of right female breast ([facility]) 07/14/2016  . Cancer ([place])   .  Cough    dry  . Diabetes mellitus without complication ([facility])   . Hypertension   . Ruptured eardrum    left    Past Surgical History:  Procedure Laterality Date  . ABDOMINAL HYSTERECTOMY     with bilateral bso  . AXILLARY LYMPH NODE DISSECTION Right 12/30/2016   Procedure: AXILLARY LYMPH NODE DISSECTION;  Surgeon: Aviva Signs, MD;  Location: [facility];  Service: General;  Laterality: Right;  . COLONOSCOPY    . otif wrist Right   . PARTIAL MASTECTOMY WITH NEEDLE LOCALIZATION Right 12/30/2016   Procedure: PARTIAL  MASTECTOMY WITH NEEDLE LOCALIZATION;  Surgeon: Aviva Signs, MD;  Location: [facility];  Service: General;  Laterality: Right;  . PORTACATH PLACEMENT Left 07/23/2016   Procedure: INSERTION PORT-A-CATH;  Surgeon: Stark Klein, MD;  Location: [facility];  Service: General;  Laterality: Left;  . RADIOLOGY WITH ANESTHESIA N/A 08/19/2017   Procedure: MRI WITH ANESTHESIA OF BRAIN WITHOUT CONTRAST;  Surgeon: Radiologist, Medication, MD;  Location: [facility];  Service: Radiology;  Laterality: N/A;  . ruptured ear drum Left      SOCIAL HISTORY:  Social History   Socioeconomic History  . Marital status: Single    Spouse name: Not on file  . Number of children: Not on file  . Years of education: Not on file  . Highest education level: Not on file  Occupational History  . Not on file  Social Needs  . Financial resource strain: Not on file  . Food insecurity:    Worry: Not on file    Inability: Not on file  . Transportation needs:    Medical: Not on file    Non-medical: Not on file  Tobacco Use  . Smoking status: Former Smoker    Packs/day: 0.50    Years: 4.00    Pack years: 2.00    Types: Cigarettes  . Smokeless tobacco: Never Used  Substance and Sexual Activity  . Alcohol use: No  . Drug use: No  . Sexual activity: Not Currently    Birth control/protection: Surgical  Lifestyle  . Physical activity:    Days per week: Not on file    Minutes per session: Not on file  . Stress: Not on file  Relationships  . Social connections:    Talks on phone: Not on file    Gets together: Not on file    Attends religious service: Not on file    Active member of club or organization: Not on file    Attends meetings of clubs or organizations: Not on file    Relationship status: Not on file  . Intimate partner violence:    Fear of current or ex partner: Not on file    Emotionally abused: Not on file    Physically abused: Not on file    Forced sexual activity: Not on file  Other Topics  Concern  . Not on file  Social History Narrative  . Not on file    FAMILY HISTORY:  Family History  Problem Relation Age of Onset  . Heart disease Mother   . Liver disease Father     CURRENT MEDICATIONS:  Outpatient Encounter Medications as of 01/10/2018  Medication Sig  . acetaminophen (TYLENOL) 500 MG tablet Take 1,000 mg by mouth every 6 (six) hours as needed for moderate pain or headache.  [name] amLODipine (NORVASC) 10 MG tablet Take 10 mg by mouth daily.  [name] anastrozole (ARIMIDEX) 1 MG tablet Take 1 tablet (1 mg total)  by mouth daily.  [name] aspirin EC 81 MG tablet Take 81 mg by mouth daily.  [name] atorvastatin (LIPITOR) 10 MG tablet Take 10 mg by mouth daily at 6 PM.   . cholecalciferol (VITAMIN D) 1000 units tablet Take 1,000 Units by mouth daily.  . fluticasone (FLONASE) 50 MCG/ACT nasal spray Place 2 sprays into both nostrils daily as needed for allergies or rhinitis.  [name] glyBURIDE-metformin (GLUCOVANCE) 5-500 MG tablet Take 1 tablet by mouth 2 (two) times daily.  . hydrochlorothiazide (HYDRODIURIL) 25 MG tablet Take 25 mg by mouth daily.  [name] lisinopril (PRINIVIL,ZESTRIL) 40 MG tablet Take 40 mg by mouth daily.   No facility-administered encounter medications on file as of 01/10/2018.     ALLERGIES:  No Known Allergies   PHYSICAL EXAM:  ECOG Performance status: 1  There were no vitals filed for this visit. There were no vitals filed for this visit.  Physical Exam   LABORATORY DATA:  I have reviewed the labs as listed.  CBC    Component Value Date/Time   WBC 5.0 01/03/2018 1110   RBC 3.92 01/03/2018 1110   HGB 11.4 (L) 01/03/2018 1110   HCT 34.6 (L) 01/03/2018 1110   PLT 227 01/03/2018 1110   MCV 88.3 01/03/2018 1110   MCH 29.1 01/03/2018 1110   MCHC 32.9 01/03/2018 1110   RDW 12.8 01/03/2018 1110   LYMPHSABS 1.8 01/03/2018 1110   MONOABS 0.3 01/03/2018 1110   EOSABS 0.1 01/03/2018 1110   BASOSABS 0.0 01/03/2018 1110   CMP Latest Ref Rng & Units 01/03/2018 09/09/2017  08/19/2017  Glucose 70 - 99 mg/dL 130(H) 163(H) 179(H)  BUN 6 - 20 mg/dL 33(H) 16 19  Creatinine 0.44 - 1.00 mg/dL 1.28(H) 0.93 1.03(H)  Sodium 135 - 145 mmol/L 141 143 140  Potassium 3.5 - 5.1 mmol/L 3.6 3.7 3.3(L)  Chloride 98 - 111 mmol/L 107 105 103  CO2 22 - 32 mmol/L 26 26 24   Calcium 8.9 - 10.3 mg/dL 9.8 10.3 10.0  Total Protein 6.5 - 8.1 g/dL - 7.5 -  Total Bilirubin 0.3 - 1.2 mg/dL - 0.8 -  Alkaline Phos 38 - 126 U/L - 92 -  AST 15 - 41 U/L - 22 -  ALT 14 - 54 U/L - 23 -       DIAGNOSTIC IMAGING:  *The following radiologic images and reports have been reviewed independently and agree with below findings.      ASSESSMENT & PLAN:   No problem-specific Assessment & Plan notes found for this encounter.      Orders placed this encounter:  No orders of the defined types were placed in this encounter.     Derek Jack, MD [place] [phone]

## 2018-01-10 NOTE — Progress Notes (Signed)
[place], [place] [address]   CLINIC:  Medical Oncology/Hematology  PCP:  [name], [place] [address] [phone]   REASON FOR VISIT:  Follow-up for right breast cancer.  CURRENT THERAPY: Anastrozole.  BRIEF ONCOLOGIC HISTORY:    Breast cancer of upper-outer quadrant of right female breast ([facility])   06/24/2016 Mammogram    Diagnostic bilateral mammogram: highly suspicious 3.5 cm mass in the UO R breast with marked enlarged R axillary LN      06/24/2016 Breast US    2.7 x 3.1 x 3.5 cm slightly irregular hypoechoic mass at the 10:00 position of right breast, 6 cm from the nipple.  4.1 x 6 cm right axillar lymph node also identified.      07/01/2016 Pathology Results    Infiltrating ductal carcinoma ER+ 80%, PR-, HER 2-, Ki67 90%      07/01/2016 Initial Biopsy    Ultrasound guided R breast biopsy and R axillary LN biopsy both were clipped post biopsy      07/20/2016 Imaging    MUGA- Upper normal left ventricular ejection fraction of 75% with normal LV wall motion.      07/21/2016 Imaging    CT CAP- 2.9 cm right upper outer quadrant breast mass, consistent with known primary breast carcinoma.  Right axillary and subpectoral lymphadenopathy, consistent with metastatic disease.  Tiny sub-cm bilateral pulmonary nodules are indeterminate, but favor postinflammatory etiology over early metastases. Recommend continued attention on follow-up CT.  No evidence of metastatic disease within the abdomen or pelvis.      07/23/2016 Procedure    Port placed by Dr. Barry Dienes      07/24/2016 - 12/04/2016 Neo-Adjuvant Chemotherapy    Adriamycin/Cytoxan x 4, followed by weekly Taxol x 12       07/30/2016 Imaging    Bone scan: Negative for osseous disease .      09/22/2016 Imaging    CT head- 1. Normal CT of the brain. No evidence of intracranial metastatic disease. 2. Frothy secretions throughout most of the paranasal  sinuses. Correlate clinically for acute sinusitis. This could serve as a source of headache.      12/30/2016 Surgery    Right lumpectomy with axillary lymph node dissection by Dr. Arnoldo Morale. Surgical path: 1. Breast, lumpectomy, right - FIBROCYSTIC AND FIBROADENOMATOID CHANGE. - USUAL DUCTAL HYPERPLASIA. - TREATMENT EFFECT. - NO RESIDUAL CARCINOMA IDENTIFIED. - SEE ONCOLOGY TABLE. 2. Lymph nodes, regional resection, right axillary contents - NINE OF NINE LYMPH NODES NEGATIVE FOR CARCINOMA (0/9). - TREATMENT EFFECT. Final stage ypT0 ypN0        CANCER STAGING: Cancer Staging Breast cancer of upper-outer quadrant of right female breast [facility]) Staging form: Breast, AJCC 8th Edition - Clinical stage from 08/07/2016: No Stage Recommended (ycT2, cN1(sn), cM0, G3, ER: Positive, PR: Negative, HER2: Negative) - Signed by Baird Cancer, PA-C on 08/07/2016    INTERVAL HISTORY:  [name] 62 y.o. female returns for follow-up of right breast cancer.  She is tolerating anastrozole very well.  Denies any hot flashes.  Denies any musculoskeletal symptoms including arthralgias.  However she developed cramps in her both legs for the last 2 weeks.  They are worse at nighttime when he is she is in the bed.  Denies any fevers, night sweats or weight loss in the last 3 to 6 months.  Energy levels are 75%.    REVIEW OF SYSTEMS:  Review of Systems  Musculoskeletal:  Muscle cramps in both legs for the last 2 weeks.  All other systems reviewed and are negative.    PAST MEDICAL/SURGICAL HISTORY:  Past Medical History:  Diagnosis Date  . Arthritis   . Breast cancer of upper-outer quadrant of right female breast ([facility]) 07/14/2016  . Cancer ([place])   . Cough    dry  . Diabetes mellitus without complication ([place])   . Hypertension   . Ruptured eardrum    left    Past Surgical History:  Procedure Laterality Date  . ABDOMINAL HYSTERECTOMY     with bilateral bso  . AXILLARY LYMPH NODE  DISSECTION Right 12/30/2016   Procedure: AXILLARY LYMPH NODE DISSECTION;  Surgeon: Aviva Signs, MD;  Location[facility];  Service: General;  Laterality: Right;  . COLONOSCOPY    . otif wrist Right   . PARTIAL MASTECTOMY WITH NEEDLE LOCALIZATION Right 12/30/2016   Procedure: PARTIAL MASTECTOMY WITH NEEDLE LOCALIZATION;  Surgeon: Aviva Signs, MD;  Location: [facility];  Service: General;  Laterality: Right;  . PORTACATH PLACEMENT Left 07/23/2016   Procedure: INSERTION PORT-A-CATH;  Surgeon: Stark Klein, MD;  Location: [facility];  Service: General;  Laterality: Left;  . RADIOLOGY WITH ANESTHESIA N/A 08/19/2017   Procedure: MRI WITH ANESTHESIA OF BRAIN WITHOUT CONTRAST;  Surgeon: Radiologist, Medication, MD;  Location: [facility];  Service: Radiology;  Laterality: N/A;  . ruptured ear drum Left      SOCIAL HISTORY:  Social History   Socioeconomic History  . Marital status: Single    Spouse name: Not on file  . Number of children: Not on file  . Years of education: Not on file  . Highest education level: Not on file  Occupational History  . Not on file  Social Needs  . Financial resource strain: Not on file  . Food insecurity:    Worry: Not on file    Inability: Not on file  . Transportation needs:    Medical: Not on file    Non-medical: Not on file  Tobacco Use  . Smoking status: Former Smoker    Packs/day: 0.50    Years: 4.00    Pack years: 2.00    Types: Cigarettes  . Smokeless tobacco: Never Used  Substance and Sexual Activity  . Alcohol use: No  . Drug use: No  . Sexual activity: Not Currently    Birth control/protection: Surgical  Lifestyle  . Physical activity:    Days per week: Not on file    Minutes per session: Not on file  . Stress: Not on file  Relationships  . Social connections:    Talks on phone: Not on file    Gets together: Not on file    Attends religious service: Not on file    Active member of club or organization: Not on file    Attends  meetings of clubs or organizations: Not on file    Relationship status: Not on file  . Intimate partner violence:    Fear of current or ex partner: Not on file    Emotionally abused: Not on file    Physically abused: Not on file    Forced sexual activity: Not on file  Other Topics Concern  . Not on file  Social History Narrative  . Not on file    FAMILY HISTORY:  Family History  Problem Relation Age of Onset  . Heart disease Mother   . Liver disease Father     CURRENT MEDICATIONS:  Outpatient Encounter Medications  as of 01/10/2018  Medication Sig  . acetaminophen (TYLENOL) 500 MG tablet Take 1,000 mg by mouth every 6 (six) hours as needed for moderate pain or headache.  [name] amLODipine (NORVASC) 10 MG tablet Take 10 mg by mouth daily.  [name] anastrozole (ARIMIDEX) 1 MG tablet Take 1 tablet (1 mg total) by mouth daily.  [name] aspirin EC 81 MG tablet Take 81 mg by mouth daily.  [name] atorvastatin (LIPITOR) 10 MG tablet Take 10 mg by mouth daily at 6 PM.   . cholecalciferol (VITAMIN D) 1000 units tablet Take 1,000 Units by mouth daily.  . fluticasone (FLONASE) 50 MCG/ACT nasal spray Place 2 sprays into both nostrils daily as needed for allergies or rhinitis.  [name] glyBURIDE-metformin (GLUCOVANCE) 5-500 MG tablet Take 1 tablet by mouth 2 (two) times daily.  . hydrochlorothiazide (HYDRODIURIL) 25 MG tablet Take 25 mg by mouth daily.  [name] lisinopril (PRINIVIL,ZESTRIL) 40 MG tablet Take 40 mg by mouth daily.  . [DISCONTINUED] anastrozole (ARIMIDEX) 1 MG tablet Take 1 tablet (1 mg total) by mouth daily.   No facility-administered encounter medications on file as of 01/10/2018.     ALLERGIES:  No Known Allergies   PHYSICAL EXAM:  ECOG Performance status: 0  Vitals:   01/10/18 1535  BP: 124/72  Pulse: 93  Resp: 16  Temp: 98.8 F (37.1 C)  SpO2: 98%   There were no vitals filed for this visit.  Physical Exam HEENT: Oropharynx has no thrush. Chest: Bilateral clear to auscultation. CVS: S1-S2  regular rate and rhythm. Breast: Right breast lumpectomy site in the upper outer quadrant is stable but has a thick scar.  No palpable mass in bilateral breast.  No palpable axillary adenopathy.  No palpable supraclavicular adenopathy. Abdomen: No palpable hepatospleno megaly. Extremities: No edema or cyanosis.  LABORATORY DATA:  I have reviewed the labs as listed.  CBC    Component Value Date/Time   WBC 5.0 01/03/2018 1110   RBC 3.92 01/03/2018 1110   HGB 11.4 (L) 01/03/2018 1110   HCT 34.6 (L) 01/03/2018 1110   PLT 227 01/03/2018 1110   MCV 88.3 01/03/2018 1110   MCH 29.1 01/03/2018 1110   MCHC 32.9 01/03/2018 1110   RDW 12.8 01/03/2018 1110   LYMPHSABS 1.8 01/03/2018 1110   MONOABS 0.3 01/03/2018 1110   EOSABS 0.1 01/03/2018 1110   BASOSABS 0.0 01/03/2018 1110   CMP Latest Ref Rng & Units 01/03/2018 09/09/2017 08/19/2017  Glucose 70 - 99 mg/dL 130(H) 163(H) 179(H)  BUN 6 - 20 mg/dL 33(H) 16 19  Creatinine 0.44 - 1.00 mg/dL 1.28(H) 0.93 1.03(H)  Sodium 135 - 145 mmol/L 141 143 140  Potassium 3.5 - 5.1 mmol/L 3.6 3.7 3.3(L)  Chloride 98 - 111 mmol/L 107 105 103  CO2 22 - 32 mmol/L _0 Calcium 8.9 - 10.3 mg/dL 9.8 10.3 10.0  Total Protein 6.5 - 8.1 g/dL - 7.5 -  Total Bilirubin 0.3 - 1.2 mg/dL - 0.8 -  Alkaline Phos 38 - 126 U/L - 92 -  AST 15 - 41 U/L - 22 -  ALT 14 - 54 U/L - 23 -       DIAGNOSTIC IMAGING:  I have reviewed her mammogram dated 09/21/2017 and discussed with the patient.     ASSESSMENT & PLAN:   Breast cancer of upper-outer quadrant of right female breast ([facility]) 1.  Stage IIb (T2 N1 M0) right breast IDC, ER positive/PR negative/HER-2 negative: - Initial diagnosis by biopsy on 07/01/2016,  Ki-67 of 90% -Status post neoadjuvant AC x4 followed by Taxol x12 from 07/24/2016 through 12/04/2016 -Status post right lumpectomy ALND on 12/30/2016 with pCR, yPT 0, ypN0 - Anastrozole started in August 2018, tolerating it very well -Last mammogram on 09/21/2017 was  within normal limits.  She was counseled to exercise on regular basis. - She reports having cramps in both calves for the last 2 weeks.  We will check a magnesium level.  Potassium was within normal limits.  2.  Bone density: -DEXA scan on 05/20/2017 shows T score of -0.6 in the right femoral neck.  She is taking calcium and vitamin D once daily.  I have told her to increase it to twice daily.  Again weightbearing exercises were recommended.      Orders placed this encounter:  Orders Placed This Encounter  Procedures  . Magnesium  . Hepatic function panel  . CBC with Differential/Platelet  . Comprehensive metabolic panel  . Cancer antigen 15-3      Derek Jack, MD [place] [phone]

## 2018-01-10 NOTE — Assessment & Plan Note (Signed)
1.  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  She was counseled to exercise on regular basis. - She reports having cramps in both calves for the last 2 weeks.  We will check a magnesium level.  Potassium was within normal limits.  2.  Bone density: -DEXA scan on 05/20/2017 shows T score of -0.6 in the right femoral neck.  She is taking calcium and vitamin D once daily.  I have told her to increase it to twice daily.  Again weightbearing exercises were recommended.

## 2018-01-10 NOTE — Patient Instructions (Signed)
[place] at [facility] Discharge Instructions  Seen by Dr. Delton Coombes today.  Follow up as scheduled.   Thank you for choosing [place] at [facility] to provide your oncology and hematology care.  To afford each patient quality time with our provider, please arrive at least 15 minutes before your scheduled appointment time.   If you have a lab appointment with the [facility] please come in thru the  Main Entrance and check in at the main information desk  You need to re-schedule your appointment should you arrive 10 or more minutes late.  We strive to give you quality time with our providers, and arriving late affects you and other patients whose appointments are after yours.  Also, if you no show three or more times for appointments you may be dismissed from the clinic at the providers discretion.     Again, thank you for choosing [facility].  Our hope is that these requests will decrease the amount of time that you wait before being seen by our physicians.       _____________________________________________________________  Should you have questions after your visit to [facility], please contact our office at [phone] between the hours of 8:30 a.m. and 4:30 p.m.  Voicemails left after 4:30 p.m. will not be returned until the following business day.  For prescription refill requests, have your pharmacy contact our office.       Resources For Cancer Patients and their Caregivers ? American Cancer Society: Can assist with transportation, wigs, general needs, runs Look Good Feel Better.        (505) 410-0320 ? Cancer Care: Provides financial assistance, online support groups, medication/co-pay assistance.  1-800-813-HOPE 520-657-7284) ? [facility] Assists [place] cancer patients and their families through emotional , educational and financial support.  (267)609-5438 ? [place] DSS Where to apply for food stamps, Medicaid and utility assistance. 385-123-9308 ? [facility]: Transportation to medical appointments. 502-064-7467 ? Social Security Administration: May apply for disability if have a Stage IV cancer. 779 367 0696 [phone] ? [employer], Disability and Transit Services: Assists with nutrition, care and transit needs. [place] Support Programs:   > Cancer Support Group  2nd Tuesday of the month 1pm-2pm, Journey Room   > Creative Journey  3rd Tuesday of the month 1130am-1pm, Journey Room

## 2018-01-11 ENCOUNTER — Inpatient Hospital Stay (HOSPITAL_COMMUNITY): Payer: 59

## 2018-01-11 DIAGNOSIS — C50411 Malignant neoplasm of upper-outer quadrant of right female breast: Secondary | ICD-10-CM | POA: Diagnosis not present

## 2018-01-11 LAB — HEPATIC FUNCTION PANEL
ALBUMIN: 3.8 g/dL (ref 3.5–5.0)
ALK PHOS: 78 U/L (ref 38–126)
ALT: 22 U/L (ref 0–44)
AST: 23 U/L (ref 15–41)
BILIRUBIN TOTAL: 0.8 mg/dL (ref 0.3–1.2)
Bilirubin, Direct: <0.1 mg/dL (ref 0.0–0.2)
TOTAL PROTEIN: 7.1 g/dL (ref 6.5–8.1)

## 2018-01-11 LAB — MAGNESIUM: Magnesium: 1.8 mg/dL (ref 1.7–2.4)

## 2018-03-01 ENCOUNTER — Encounter (HOSPITAL_COMMUNITY): Payer: Self-pay

## 2018-03-01 ENCOUNTER — Inpatient Hospital Stay (HOSPITAL_COMMUNITY): Payer: 59 | Attending: Hematology

## 2018-03-01 VITALS — BP 125/67 | HR 86 | Temp 98.1°F | Resp 18

## 2018-03-01 DIAGNOSIS — Z79811 Long term (current) use of aromatase inhibitors: Secondary | ICD-10-CM | POA: Insufficient documentation

## 2018-03-01 DIAGNOSIS — Z95828 Presence of other vascular implants and grafts: Secondary | ICD-10-CM

## 2018-03-01 DIAGNOSIS — Z452 Encounter for adjustment and management of vascular access device: Secondary | ICD-10-CM | POA: Diagnosis not present

## 2018-03-01 DIAGNOSIS — C50411 Malignant neoplasm of upper-outer quadrant of right female breast: Secondary | ICD-10-CM | POA: Insufficient documentation

## 2018-03-01 DIAGNOSIS — Z170 Estrogen receptor positive status [ER+]: Secondary | ICD-10-CM | POA: Diagnosis not present

## 2018-03-01 DIAGNOSIS — Z9221 Personal history of antineoplastic chemotherapy: Secondary | ICD-10-CM | POA: Insufficient documentation

## 2018-03-01 MED ORDER — SODIUM CHLORIDE 0.9% FLUSH
10.0000 mL | Freq: Once | INTRAVENOUS | Status: AC
  Administered 2018-03-01: 10 mL via INTRAVENOUS

## 2018-03-01 MED ORDER — HEPARIN SOD (PORK) LOCK FLUSH 100 UNIT/ML IV SOLN
500.0000 [IU] | Freq: Once | INTRAVENOUS | Status: AC
  Administered 2018-03-01: 500 [IU] via INTRAVENOUS

## 2018-03-01 NOTE — Progress Notes (Signed)
Patient tolerated port flush with no complaints voiced.  No blood return noted but patient denied pain with flush.  Patient stated she does not get a blood return with flushes.  No bruising or swelling noted at site.  Band aid applied.  VSS with discharge and left am

## 2018-03-01 NOTE — Patient Instructions (Signed)
[facility]  Discharge Instructions:  Your port was flushed today. _______________________________________________________________  Thank you for choosing [facility] at [facility] to provide your oncology and hematology care.  To afford each patient quality time with our providers, please arrive at least 15 minutes before your scheduled appointment.  You need to re-schedule your appointment if you arrive 10 or more minutes late.  We strive to give you quality time with our providers, and arriving late affects you and other patients whose appointments are after yours.  Also, if you no show three or more times for appointments you may be dismissed from the clinic.  Again, thank you for choosing  [facility]. Our hope is that these requests will allow you access to exceptional care and in a timely manner. _______________________________________________________________  If you have questions after your visit, please contact our office at [phone] between the hours of 8:30 a.m. and 5:00 p.m. Voicemails left after 4:30 p.m. will not be returned until the following business day. _______________________________________________________________  For prescription refill requests, have your pharmacy contact our office. _______________________________________________________________  Recommendations made by the consultant and any test results will be sent to your referring physician. _______________________________________________________________ 

## 2018-03-09 ENCOUNTER — Encounter: Payer: Self-pay | Admitting: Orthopedic Surgery

## 2018-03-09 ENCOUNTER — Ambulatory Visit (INDEPENDENT_AMBULATORY_CARE_PROVIDER_SITE_OTHER): Payer: 59 | Admitting: Orthopedic Surgery

## 2018-03-09 ENCOUNTER — Ambulatory Visit (INDEPENDENT_AMBULATORY_CARE_PROVIDER_SITE_OTHER): Payer: 59

## 2018-03-09 VITALS — BP 130/76 | HR 80 | Ht 64.0 in | Wt 190.0 lb

## 2018-03-09 DIAGNOSIS — M1711 Unilateral primary osteoarthritis, right knee: Secondary | ICD-10-CM | POA: Diagnosis not present

## 2018-03-09 DIAGNOSIS — M7122 Synovial cyst of popliteal space [Baker], left knee: Secondary | ICD-10-CM | POA: Diagnosis not present

## 2018-03-09 DIAGNOSIS — G8929 Other chronic pain: Secondary | ICD-10-CM | POA: Diagnosis not present

## 2018-03-09 DIAGNOSIS — M25561 Pain in right knee: Secondary | ICD-10-CM

## 2018-03-09 NOTE — Progress Notes (Addendum)
NEW PATIENT OFFICE VISI  Chief Complaint  Patient presents with  . Knee Pain    LEFT    62 year old female sent to [place] for evaluation of left knee pain which revealed 6 x 4 x 2 cm Baker's cyst left popliteal fossa she had an x-ray and ultrasound on March 2019 complains of pain swelling difficulty walking on the left leg.  She also says of the right leg is been aching and she was told in the past that she had a tumor on it  She status post breast surgery with chemotherapy which she recovered from nicely  She is had trouble working because of the pain in the left knee  No catching locking or giving way but painful and difficulty weightbearing she did wear a brace which seemed to help.   Review of Systems  Constitutional: Negative for chills and fever.  Musculoskeletal: Positive for joint pain.  Skin: Negative.   Neurological: Negative for tingling and focal weakness.     Past Medical History:  Diagnosis Date  . Arthritis   . Breast cancer of upper-outer quadrant of right female breast ([place]) 07/14/2016  . Cancer ([place])   . Cough    dry  . Diabetes mellitus without complication ([place])   . Hypertension   . Ruptured eardrum    left     Past Surgical History:  Procedure Laterality Date  . ABDOMINAL HYSTERECTOMY     with bilateral bso  . AXILLARY LYMPH NODE DISSECTION Right 12/30/2016   Procedure: AXILLARY LYMPH NODE DISSECTION;  Surgeon: Aviva Signs, MD;  Location: [facility];  Service: General;  Laterality: Right;  . COLONOSCOPY    . otif wrist Right   . PARTIAL MASTECTOMY WITH NEEDLE LOCALIZATION Right 12/30/2016   Procedure: PARTIAL MASTECTOMY WITH NEEDLE LOCALIZATION;  Surgeon: Aviva Signs, MD;  Location: [facility];  Service: General;  Laterality: Right;  . PORTACATH PLACEMENT Left 07/23/2016   Procedure: INSERTION PORT-A-CATH;  Surgeon: Stark Klein, MD;  Location: [place];  Service: General;  Laterality: Left;  . RADIOLOGY WITH ANESTHESIA N/A 08/19/2017   Procedure: MRI WITH ANESTHESIA OF BRAIN WITHOUT CONTRAST;  Surgeon: Radiologist, Medication, MD;  Location: [place];  Service: Radiology;  Laterality: N/A;  . ruptured ear drum Left     Family History  Problem Relation Age of Onset  . Heart disease Mother   . Diabetes Mother   . Liver disease Father   . Stroke Brother    Social History   Tobacco Use  . Smoking status: Former Smoker    Packs/day: 0.50    Years: 4.00    Pack years: 2.00    Types: Cigarettes  . Smokeless tobacco: Never Used  Substance Use Topics  . Alcohol use: No  . Drug use: No    No Known Allergies  Current Meds  Medication Sig  . amLODipine (NORVASC) 10 MG tablet Take 10 mg by mouth daily.  [name] anastrozole (ARIMIDEX) 1 MG tablet Take 1 tablet (1 mg total) by mouth daily.  [name] aspirin EC 81 MG tablet Take 81 mg by mouth daily.  [name] atorvastatin (LIPITOR) 10 MG tablet Take 10 mg by mouth daily at 6 PM.   . cholecalciferol (VITAMIN D) 1000 units tablet Take 1,000 Units by mouth daily.  . fluticasone (FLONASE) 50 MCG/ACT nasal spray Place 2 sprays into both nostrils daily as needed for allergies or rhinitis.  [name] glyBURIDE-metformin (GLUCOVANCE) 5-500 MG tablet Take 1 tablet by mouth 2 (two) times daily.  [name]  hydrochlorothiazide (HYDRODIURIL) 25 MG tablet Take 25 mg by mouth daily.  [name] lisinopril (PRINIVIL,ZESTRIL) 40 MG tablet Take 40 mg by mouth daily.  . pioglitazone (ACTOS) 15 MG tablet Take 15 mg by mouth daily.    BP 130/76   Pulse 80   Ht 5\' 4"  (1.626 m)   Wt 190 lb (86.2 kg)   BMI 32.61 kg/m   Physical Exam  Constitutional: She is oriented to person, place, and time. She appears well-developed and well-nourished.  Musculoskeletal:  Ambulatory today with no assistive devices no evidence of a limp  Neurological: She is alert and oriented to person, place, and time.  Psychiatric: She has a normal mood and affect. Judgment normal.  Vitals reviewed.   Ortho Exam  Left knee feels tight with flexion up to  125 degrees comes to full extension joint lines are nontender painful to palpation at the posterior popliteal fossa ligaments are stable muscle tone and strength are normal there are no tremors no effusion is noted.  Neurovascular exam is intact skin is normal  Right knee nontender full range of motion all ligaments are stable muscle strength and tone are normal skin is intact no erythema neurovascular exam is normal  MEDICAL DECISION SECTION  Xrays were done at Right knee was done in the office mild arthritis dictated report left knee was done the hospital along with ultrasound and a cyst was noted 6 x 4 x 2 popliteal fossa left knee  My impression independent reading of the left knee x-ray mild arthritis  Reports of left knee imaging CLINICAL DATA:  Anterior knee pain and swelling, with knot in the posterior knee for the past 2-3 days.   EXAM: LEFT KNEE - COMPLETE 4+ VIEW   COMPARISON:  None.   FINDINGS: No acute fracture or dislocation. Mild tricompartmental joint space narrowing with osteophyte formation. No significant joint effusion. Osteopenia. Vascular calcifications.   IMPRESSION: Mild tricompartmental osteoarthritis.  No acute osseous abnormality.     Electronically Signed   By: Titus Dubin M.D.   On: 09/10/2017 08:17   CLINICAL DATA:  Left knee pain   EXAM: ULTRASOUND LEFT LOWER EXTREMITY LIMITED   TECHNIQUE: Ultrasound examination of the lower extremity soft tissues was performed in the area of clinical concern.   COMPARISON:  None.   FINDINGS: There is a left popliteal fossa cyst which measures 6.3 x 4.6 x 1.8 cm. Minimally minimal complexity with internal echoes.   IMPRESSION: IMPRESSION Left popliteal fossa (Baker's) cyst.     Electronically Signed   By: Rolm Baptise M.D.   On: 09/15/2017 16:47   Encounter Diagnoses  Name Primary?  . Primary osteoarthritis of right knee Yes  . Baker's cyst of knee, left     PLAN: (Rx., injectx,  surgery, frx, mri/ct) Recommend ultrasound-guided aspiration of cyst  Follow-up 2 weeks after  If no improvement MRI can be ordered to further manage the knee and cyst  No orders of the defined types were placed in this encounter.   Arther Abbott, MD  03/09/2018 11:05 AM

## 2018-03-17 ENCOUNTER — Telehealth: Payer: Self-pay | Admitting: Radiology

## 2018-03-17 NOTE — Telephone Encounter (Signed)
I have called again to check on the US aspiration of Bakers cyst and has been sch for Tuesday Sept 24th at 9 am arrive at [place].  I have called patient to advise. Left message for her to call me back.

## 2018-03-17 NOTE — Telephone Encounter (Signed)
Please call [name] first thing on Friday morning.  Please call on home phone.  She will await your call

## 2018-03-17 NOTE — Telephone Encounter (Signed)
Patient left message on voicemail for you to call her back. I tried to call her and left a message saying for her to give me a call and I would be glad to try to help her.

## 2018-03-18 ENCOUNTER — Telehealth: Payer: Self-pay | Admitting: Orthopedic Surgery

## 2018-03-18 NOTE — Telephone Encounter (Signed)
I have called her, but she is not home, left message for her to call back.

## 2018-03-18 NOTE — Telephone Encounter (Signed)
I called again she is not home left another message for her to call me back.

## 2018-03-18 NOTE — Telephone Encounter (Signed)
Patient has called again to speak with you. I told her it would be after clinic today.  Please call

## 2018-03-22 NOTE — Telephone Encounter (Signed)
Unable to reach.

## 2018-03-24 NOTE — Telephone Encounter (Signed)
I called again, have tried several times to reach her about the LEFT knee US guided aspiration. She is scheduled for this on 03/29/18 and she is aware  She states she also has pain of her RIGHT knee which is bothering her more than the LEFT wants to know what the plan is for the RIGHT knee. I offered to transfer her to make appointment, but she wants me to ask you, since she was just here.

## 2018-03-24 NOTE — Telephone Encounter (Signed)
Patient called(Voice message, 3:03pm) - returning Amy's call.

## 2018-03-29 ENCOUNTER — Telehealth: Payer: Self-pay | Admitting: Radiology

## 2018-03-29 ENCOUNTER — Ambulatory Visit
Admission: RE | Admit: 2018-03-29 | Discharge: 2018-03-29 | Disposition: A | Discharge status: Home / Self Care | Payer: 59 | Source: Ambulatory Visit | Attending: Orthopedic Surgery | Admitting: Orthopedic Surgery

## 2018-03-29 DIAGNOSIS — M7122 Synovial cyst of popliteal space [Baker], left knee: Secondary | ICD-10-CM

## 2018-03-29 NOTE — Telephone Encounter (Signed)
[facility] called asking about injection, I have advised Dr Aline Brochure ordered aspiration only of the bakers cyst, not injection

## 2018-04-11 ENCOUNTER — Telehealth: Payer: Self-pay | Admitting: Orthopedic Surgery

## 2018-04-11 NOTE — Telephone Encounter (Signed)
Since the aspiration of the left knee, pt doesn't know if she is to come back in to this office or not.  Also she states that she wants to see Dr [name] for the right knee and also the left knee  Is this okay to schedule?

## 2018-04-11 NOTE — Telephone Encounter (Signed)
Yes, we need to see her after the aspiration Ok for both knees  4 weeks after the aspiration / I had a VERY hard time reaching her to let her know about the aspiration.

## 2018-04-12 NOTE — Telephone Encounter (Signed)
I called patient back and have scheduled her for Monday, 04/25/18.

## 2018-04-25 ENCOUNTER — Ambulatory Visit (INDEPENDENT_AMBULATORY_CARE_PROVIDER_SITE_OTHER): Payer: 59 | Admitting: Orthopedic Surgery

## 2018-04-25 VITALS — BP 125/69 | HR 90 | Ht 64.0 in | Wt 195.0 lb

## 2018-04-25 DIAGNOSIS — M1711 Unilateral primary osteoarthritis, right knee: Secondary | ICD-10-CM | POA: Diagnosis not present

## 2018-04-25 DIAGNOSIS — M25562 Pain in left knee: Secondary | ICD-10-CM | POA: Diagnosis not present

## 2018-04-25 DIAGNOSIS — M25462 Effusion, left knee: Secondary | ICD-10-CM

## 2018-04-25 MED ORDER — DICLOFENAC SODIUM 75 MG PO TBEC: 75.0000 mg | DELAYED_RELEASE_TABLET | Freq: Two times a day (BID) | ORAL | 2 refills | Status: DC

## 2018-04-25 NOTE — Progress Notes (Signed)
Progress Note   Patient ID: [name], female   DOB: 1956-03-30, 62 y.o.   MRN: [id number]   Chief Complaint  Patient presents with  . Follow-up    Recheck on right knee new pain swelling left kneek    62 year old female comes in for follow-up regarding her right knee status post aspiration of Baker's cyst right knee complains of bilateral knee swelling after working.  She works 12-hour shifts she does some climbing at work at the end of the day her knees are swollen and by the next morning they seem to go down.  She is not on any medication.  When the knees are swollen she complains of bilateral aching knee pain moderate in severity associated with stiffness and swollen  Review of Systems  Constitutional: Negative for chills, fever and weight loss.  Musculoskeletal: Negative for back pain and joint pain.  Neurological: Negative for tingling and tremors.   No outpatient medications have been marked as taking for the 04/25/18 encounter (Office Visit) with Carole Civil, MD.    Past Medical History:  Diagnosis Date  . Arthritis   . Breast cancer of upper-outer quadrant of right female breast ([place]) 07/14/2016  . Cancer ([place])   . Cough    dry  . Diabetes mellitus without complication ([place])   . Hypertension   . Ruptured eardrum    left      No Known Allergies   BP 125/69   Pulse 90   Ht 5\' 4"  (1.626 m)   Wt 195 lb (88.5 kg)   BMI 33.47 kg/m    Physical Exam  Musculoskeletal:       Right knee: She exhibits effusion.       Left knee: She exhibits no effusion.   General appearance normal Oriented x3 normal Mood pleasant affect normal Gait normal   Right Knee Exam   Muscle Strength  The patient has normal right knee strength.  Tenderness  The patient is experiencing tenderness in the medial joint line.  Range of Motion  Extension: normal  Flexion:  120 normal   Tests  McMurray:  Medial - negative Lateral - negative Varus: negative Valgus:  negative Drawer:  Anterior - negative    Posterior - negative  Other  Erythema: absent Scars: absent Sensation: normal Pulse: present Swelling: none Effusion: effusion present   Left Knee Exam   Muscle Strength  The patient has normal left knee strength.  Tenderness  The patient is experiencing tenderness in the medial joint line.  Range of Motion  Extension: normal  Flexion:  120 normal   Tests  McMurray:  Medial - negative Lateral - negative Varus: negative Valgus: negative Drawer:  Anterior - negative     Posterior - negative  Other  Erythema: absent Scars: absent Sensation: normal Pulse: present Swelling: none Effusion: no effusion present          MEDICAL DECISION MAKING   Imaging:  X-ray was done here in April Radiology report at [facility]   Dr. Aline Brochure dictating   AP lateral and patellar view right knee for right knee pain with possible cyst   X-rays show neutral alignment between the tibia and femur   The joint spaces seem to be maintained with slight narrowing of the medial compartment.  There is no evidence of secondary bone change   On lateral x-ray we do see an anterior osteophyte at the proximal tibia prominent tibial tubercle small osteophytes in the posterior tibia joint space  narrowing on the medial side large osteophyte from the anterior portion of the patella   Overall impression mild arthritis medial compartment right knee no evidence of effusion   Encounter Diagnoses  Name Primary?  . Primary osteoarthritis of right knee Yes  . Pain and swelling of left knee      PLAN: (RX., injection, surgery,frx,mri/ct, XR 2 body ares) Recommend diclofenac twice a day  Follow-up 6 months x-ray of the knees  Meds ordered this encounter  Medications  . diclofenac (VOLTAREN) 75 MG EC tablet    Sig: Take 1 tablet (75 mg total) by mouth 2 (two) times daily with a meal.    Dispense:  60 tablet    Refill:  2   3:22  PM 04/25/2018

## 2018-05-06 ENCOUNTER — Other Ambulatory Visit: Payer: Self-pay

## 2018-05-06 ENCOUNTER — Encounter (HOSPITAL_COMMUNITY): Payer: Self-pay

## 2018-05-06 ENCOUNTER — Inpatient Hospital Stay (HOSPITAL_COMMUNITY): Payer: 59 | Attending: Hematology

## 2018-05-06 ENCOUNTER — Inpatient Hospital Stay (HOSPITAL_COMMUNITY): Payer: 59

## 2018-05-06 DIAGNOSIS — Z87891 Personal history of nicotine dependence: Secondary | ICD-10-CM | POA: Diagnosis not present

## 2018-05-06 DIAGNOSIS — C50411 Malignant neoplasm of upper-outer quadrant of right female breast: Secondary | ICD-10-CM | POA: Diagnosis present

## 2018-05-06 DIAGNOSIS — Z79899 Other long term (current) drug therapy: Secondary | ICD-10-CM | POA: Insufficient documentation

## 2018-05-06 DIAGNOSIS — Z170 Estrogen receptor positive status [ER+]: Secondary | ICD-10-CM | POA: Diagnosis not present

## 2018-05-06 DIAGNOSIS — Z7982 Long term (current) use of aspirin: Secondary | ICD-10-CM | POA: Diagnosis not present

## 2018-05-06 DIAGNOSIS — I10 Essential (primary) hypertension: Secondary | ICD-10-CM | POA: Diagnosis not present

## 2018-05-06 LAB — COMPREHENSIVE METABOLIC PANEL
ALT: 28 U/L (ref 0–44)
AST: 27 U/L (ref 15–41)
Albumin: 3.8 g/dL (ref 3.5–5.0)
Alkaline Phosphatase: 82 U/L (ref 38–126)
Anion gap: 10 (ref 5–15)
BUN: 24 mg/dL — AB (ref 6–20)
CALCIUM: 9.5 mg/dL (ref 8.9–10.3)
CO2: 24 mmol/L (ref 22–32)
CREATININE: 1.22 mg/dL — AB (ref 0.44–1.00)
Chloride: 104 mmol/L (ref 98–111)
GFR calc Af Amer: 55 mL/min — ABNORMAL LOW (ref >60)
GFR, EST NON AFRICAN AMERICAN: 47 mL/min — AB (ref >60)
Glucose, Bld: 185 mg/dL — ABNORMAL HIGH (ref 70–99)
Potassium: 3.6 mmol/L (ref 3.5–5.1)
Sodium: 138 mmol/L (ref 135–145)
Total Bilirubin: 0.5 mg/dL (ref 0.3–1.2)
Total Protein: 7.2 g/dL (ref 6.5–8.1)

## 2018-05-06 LAB — CBC WITH DIFFERENTIAL/PLATELET
Abs Immature Granulocytes: 0.01 10*3/uL (ref 0.00–0.07)
Basophils Absolute: 0 10*3/uL (ref 0.0–0.1)
Basophils Relative: 0 %
EOS ABS: 0.1 10*3/uL (ref 0.0–0.5)
Eosinophils Relative: 2 %
HEMATOCRIT: 36.5 % (ref 36.0–46.0)
Hemoglobin: 12.1 g/dL (ref 12.0–15.0)
IMMATURE GRANULOCYTES: 0 %
LYMPHS ABS: 1.2 10*3/uL (ref 0.7–4.0)
Lymphocytes Relative: 26 %
MCH: 29.2 pg (ref 26.0–34.0)
MCHC: 33.2 g/dL (ref 30.0–36.0)
MCV: 88 fL (ref 80.0–100.0)
MONO ABS: 0.4 10*3/uL (ref 0.1–1.0)
MONOS PCT: 9 %
NEUTROS PCT: 63 %
Neutro Abs: 2.9 10*3/uL (ref 1.7–7.7)
Platelets: 220 10*3/uL (ref 150–400)
RBC: 4.15 MIL/uL (ref 3.87–5.11)
RDW: 11.9 % (ref 11.5–15.5)
WBC: 4.5 10*3/uL (ref 4.0–10.5)
nRBC: 0 % (ref 0.0–0.2)

## 2018-05-06 MED ORDER — SODIUM CHLORIDE 0.9% FLUSH
10.0000 mL | INTRAVENOUS | Status: DC | PRN
  Administered 2018-05-06: 10 mL via INTRAVENOUS
  Filled 2018-05-06: qty 10

## 2018-05-06 MED ORDER — HEPARIN SOD (PORK) LOCK FLUSH 100 UNIT/ML IV SOLN
500.0000 [IU] | Freq: Once | INTRAVENOUS | Status: AC
  Administered 2018-05-06: 500 [IU] via INTRAVENOUS

## 2018-05-06 NOTE — Progress Notes (Signed)
[name] presented for Portacath access and flush.   Portacath located left chest wall accessed with  H 20 needle.  Good blood return present. Portacath flushed with 1ml NS and 500U/109ml Heparin and needle removed intact.  Procedure tolerated well and without incident.  Discharged ambulatory.

## 2018-05-07 LAB — CANCER ANTIGEN 15-3: CAN 15 3: 20.1 U/mL (ref 0.0–25.0)

## 2018-05-12 ENCOUNTER — Other Ambulatory Visit (HOSPITAL_COMMUNITY): Payer: 59

## 2018-05-13 ENCOUNTER — Ambulatory Visit (HOSPITAL_COMMUNITY): Payer: 59 | Admitting: Hematology

## 2018-05-13 ENCOUNTER — Encounter (HOSPITAL_COMMUNITY): Payer: Self-pay | Admitting: Hematology

## 2018-05-13 ENCOUNTER — Inpatient Hospital Stay (HOSPITAL_BASED_OUTPATIENT_CLINIC_OR_DEPARTMENT_OTHER): Payer: 59 | Admitting: Hematology

## 2018-05-13 VITALS — BP 136/81 | HR 95 | Temp 97.9°F | Resp 18 | Ht 64.0 in | Wt 200.0 lb

## 2018-05-13 DIAGNOSIS — C50411 Malignant neoplasm of upper-outer quadrant of right female breast: Secondary | ICD-10-CM | POA: Diagnosis not present

## 2018-05-13 DIAGNOSIS — Z170 Estrogen receptor positive status [ER+]: Secondary | ICD-10-CM | POA: Diagnosis not present

## 2018-05-13 DIAGNOSIS — Z7982 Long term (current) use of aspirin: Secondary | ICD-10-CM

## 2018-05-13 DIAGNOSIS — I10 Essential (primary) hypertension: Secondary | ICD-10-CM | POA: Diagnosis not present

## 2018-05-13 DIAGNOSIS — Z87891 Personal history of nicotine dependence: Secondary | ICD-10-CM

## 2018-05-13 DIAGNOSIS — Z79899 Other long term (current) drug therapy: Secondary | ICD-10-CM

## 2018-05-13 NOTE — Assessment & Plan Note (Signed)
1.  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 -Today's examination shows stable thickening of the right breast lumpectomy area.  No palpable masses.  No palpable adenopathy. - We will see her back in 5 months after her mammogram.  We went over the blood work which was within normal limits.  2.  Bone density: -DEXA scan on 05/20/2017 shows T score of -0.6 in the right femoral neck. - She was counseled to do weightbearing exercises.  She will continue calcium and vitamin D twice daily.

## 2018-05-13 NOTE — Progress Notes (Signed)
[place], [place] [id number]   CLINIC:  Medical Oncology/Hematology  PCP:  Monico Blitz, [place] [address] [phone]   REASON FOR VISIT: Follow-up for right breast cancer, ER+/PR-/HER2-  CURRENT THERAPY: arimidex  BRIEF ONCOLOGIC HISTORY:    Breast cancer of upper-outer quadrant of right female breast ([place])   06/24/2016 Mammogram    Diagnostic bilateral mammogram: highly suspicious 3.5 cm mass in the UO R breast with marked enlarged R axillary LN    06/24/2016 Breast US    2.7 x 3.1 x 3.5 cm slightly irregular hypoechoic mass at the 10:00 position of right breast, 6 cm from the nipple.  4.1 x 6 cm right axillar lymph node also identified.    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    07/21/2016 Imaging    CT CAP- 2.9 cm right upper outer quadrant breast mass, consistent with known primary breast carcinoma.  Right axillary and subpectoral lymphadenopathy, consistent with metastatic disease.  Tiny sub-cm bilateral pulmonary nodules are indeterminate, but favor postinflammatory etiology over early metastases. Recommend continued attention on follow-up CT.  No evidence of metastatic disease within the abdomen or pelvis.    07/23/2016 Procedure    Port placed by Dr. Barry Dienes    07/24/2016 - 12/04/2016 Neo-Adjuvant Chemotherapy    Adriamycin/Cytoxan x 4, followed by weekly Taxol x 12     07/30/2016 Imaging    Bone scan: Negative for osseous disease .    09/22/2016 Imaging    CT head- 1. Normal CT of the brain. No evidence of intracranial metastatic disease. 2. Frothy secretions throughout most of the paranasal sinuses. Correlate  clinically for acute sinusitis. This could serve as a source of headache.    12/30/2016 Surgery    Right lumpectomy with axillary lymph node dissection by Dr. Arnoldo Morale. Surgical path: 1. Breast, lumpectomy, right - FIBROCYSTIC AND FIBROADENOMATOID CHANGE. - USUAL DUCTAL HYPERPLASIA. - TREATMENT EFFECT. - NO RESIDUAL CARCINOMA IDENTIFIED. - SEE ONCOLOGY TABLE. 2. Lymph nodes, regional resection, right axillary contents - NINE OF NINE LYMPH NODES NEGATIVE FOR CARCINOMA (0/9). - TREATMENT EFFECT. Final stage ypT0 ypN0      CANCER STAGING: Cancer Staging Breast cancer of upper-outer quadrant of right female breast [facility]) Staging form: Breast, AJCC 8th Edition - Clinical stage from 08/07/2016: No Stage Recommended (ycT2, cN1(sn), cM0, G3, ER: Positive, PR: Negative, HER2: Negative) - Signed by Baird Cancer, PA-C on 08/07/2016    INTERVAL HISTORY:  Ms. Thackston 62 y.o. female returns for routine follow-up for right breast cancer. Patient is here today and doing well since her last visit. She has no side effects from the medications and no other complaints at this time. She denies any hot flashes. Denies any skin rashes or mouth sores. Denies any nausea, vomiting, or diarrhea. She reports her appetite at 100% and her energy level at 75%. She lives at home and performs all her own ADLs and activities.    REVIEW OF SYSTEMS:  Review of Systems  All other systems reviewed and are negative.    PAST MEDICAL/SURGICAL HISTORY:  Past Medical History:  Diagnosis Date  . Arthritis   .  Breast cancer of upper-outer quadrant of right female breast ([place]) 07/14/2016  . Cancer ([place])   . Cough    dry  . Diabetes mellitus without complication ([place])   . Hypertension   . Ruptured eardrum    left    Past Surgical History:  Procedure Laterality Date  . ABDOMINAL HYSTERECTOMY     with bilateral bso  . AXILLARY LYMPH NODE DISSECTION Right 12/30/2016   Procedure: AXILLARY LYMPH NODE DISSECTION;   Surgeon: Aviva Signs, MD;  Location: [facility];  Service: General;  Laterality: Right;  . COLONOSCOPY    . otif wrist Right   . PARTIAL MASTECTOMY WITH NEEDLE LOCALIZATION Right 12/30/2016   Procedure: PARTIAL MASTECTOMY WITH NEEDLE LOCALIZATION;  Surgeon: Aviva Signs, MD;  Location: [facility];  Service: General;  Laterality: Right;  . PORTACATH PLACEMENT Left 07/23/2016   Procedure: INSERTION PORT-A-CATH;  Surgeon: Stark Klein, MD;  Location: [facility];  Service: General;  Laterality: Left;  . RADIOLOGY WITH ANESTHESIA N/A 08/19/2017   Procedure: MRI WITH ANESTHESIA OF BRAIN WITHOUT CONTRAST;  Surgeon: Radiologist, Medication, MD;  Location: [facility];  Service: Radiology;  Laterality: N/A;  . ruptured ear drum Left      SOCIAL HISTORY:  Social History   Socioeconomic History  . Marital status: Single    Spouse name: Not on file  . Number of children: Not on file  . Years of education: Not on file  . Highest education level: Not on file  Occupational History  . Not on file  Social Needs  . Financial resource strain: Not on file  . Food insecurity:    Worry: Not on file    Inability: Not on file  . Transportation needs:    Medical: Not on file    Non-medical: Not on file  Tobacco Use  . Smoking status: Former Smoker    Packs/day: 0.50    Years: 4.00    Pack years: 2.00    Types: Cigarettes  . Smokeless tobacco: Never Used  Substance and Sexual Activity  . Alcohol use: No  . Drug use: No  . Sexual activity: Not Currently    Birth control/protection: Surgical  Lifestyle  . Physical activity:    Days per week: Not on file    Minutes per session: Not on file  . Stress: Not on file  Relationships  . Social connections:    Talks on phone: Not on file    Gets together: Not on file    Attends religious service: Not on file    Active member of club or organization: Not on file    Attends meetings of clubs or organizations: Not on file    Relationship status: Not  on file  . Intimate partner violence:    Fear of current or ex partner: Not on file    Emotionally abused: Not on file    Physically abused: Not on file    Forced sexual activity: Not on file  Other Topics Concern  . Not on file  Social History Narrative  . Not on file    FAMILY HISTORY:  Family History  Problem Relation Age of Onset  . Heart disease Mother   . Diabetes Mother   . Liver disease Father   . Stroke Brother     CURRENT MEDICATIONS:  Outpatient Encounter Medications as of 05/13/2018  Medication Sig  . amLODipine (NORVASC) 10 MG tablet Take 10 mg by mouth daily.  [name] anastrozole (ARIMIDEX) 1 MG tablet Take  1 tablet (1 mg total) by mouth daily.  [name] aspirin EC 81 MG tablet Take 81 mg by mouth daily.  [name] atorvastatin (LIPITOR) 10 MG tablet Take 10 mg by mouth daily at 6 PM.   . cholecalciferol (VITAMIN D) 1000 units tablet Take 1,000 Units by mouth daily.  . diclofenac (VOLTAREN) 75 MG EC tablet Take 1 tablet (75 mg total) by mouth 2 (two) times daily with a meal.  . fluticasone (FLONASE) 50 MCG/ACT nasal spray Place 2 sprays into both nostrils daily as needed for allergies or rhinitis.  [name] glyBURIDE-metformin (GLUCOVANCE) 5-500 MG tablet Take 1 tablet by mouth 2 (two) times daily.  [name] lisinopril (PRINIVIL,ZESTRIL) 40 MG tablet Take 40 mg by mouth daily.  . pioglitazone (ACTOS) 15 MG tablet Take 15 mg by mouth daily.  . [DISCONTINUED] acetaminophen (TYLENOL) 500 MG tablet Take 1,000 mg by mouth every 6 (six) hours as needed for moderate pain or headache.  . hydrochlorothiazide (HYDRODIURIL) 25 MG tablet Take 25 mg by mouth daily.   No facility-administered encounter medications on file as of 05/13/2018.     ALLERGIES:  No Known Allergies   PHYSICAL EXAM:  ECOG Performance status: 1  Vitals:   05/13/18 1300  BP: 136/81  Pulse: 95  Resp: 18  Temp: 97.9 F (36.6 C)  SpO2: 99%   Filed Weights   05/13/18 1300  Weight: 200 lb (90.7 kg)    Physical Exam    Constitutional: She is oriented to person, place, and time. She appears well-developed and well-nourished.  Musculoskeletal: Normal range of motion.  Neurological: She is alert and oriented to person, place, and time.  Skin: Skin is warm and dry.  Psychiatric: She has a normal mood and affect. Her behavior is normal. Judgment and thought content normal.  Breast exam: Right breast lumpectomy area is thickened and stable.  No palpable mass in bilateral breast.  No palpable adenopathy in the axillary or supraclavicular region. Abdomen: No palpable hepatospleno megaly.   LABORATORY DATA:  I have reviewed the labs as listed.  CBC    Component Value Date/Time   WBC 4.5 05/06/2018 1344   RBC 4.15 05/06/2018 1344   HGB 12.1 05/06/2018 1344   HCT 36.5 05/06/2018 1344   PLT 220 05/06/2018 1344   MCV 88.0 05/06/2018 1344   MCH 29.2 05/06/2018 1344   MCHC 33.2 05/06/2018 1344   RDW 11.9 05/06/2018 1344   LYMPHSABS 1.2 05/06/2018 1344   MONOABS 0.4 05/06/2018 1344   EOSABS 0.1 05/06/2018 1344   BASOSABS 0.0 05/06/2018 1344   CMP Latest Ref Rng & Units 05/06/2018 01/11/2018 01/03/2018  Glucose 70 - 99 mg/dL 185(H) - 130(H)  BUN 6 - 20 mg/dL 24(H) - 33(H)  Creatinine 0.44 - 1.00 mg/dL 1.22(H) - 1.28(H)  Sodium 135 - 145 mmol/L 138 - 141  Potassium 3.5 - 5.1 mmol/L 3.6 - 3.6  Chloride 98 - 111 mmol/L 104 - 107  CO2 22 - 32 mmol/L 24 - 26  Calcium 8.9 - 10.3 mg/dL 9.5 - 9.8  Total Protein 6.5 - 8.1 g/dL 7.2 7.1 -  Total Bilirubin 0.3 - 1.2 mg/dL 0.5 0.8 -  Alkaline Phos 38 - 126 U/L 82 78 -  AST 15 - 41 U/L 27 23 -  ALT 0 - 44 U/L 28 22 -       DIAGNOSTIC IMAGING:  I have reviewed her mammogram dated 09/21/2017.     ASSESSMENT & PLAN:   Breast cancer of upper-outer quadrant of  right female breast ([place]) 1.  Stage IIb (T2 N1 M0) right breast IDC, ER positive/PR negative/HER-2 negative: - Initial diagnosis by biopsy on 07/01/2016, Ki-67 of 90% -Status post neoadjuvant AC x4 followed  by Taxol x12 from 07/24/2016 through 12/04/2016 -Status post right lumpectomy ALND on 12/30/2016 with pCR, yPT 0, ypN0 - Anastrozole started in August 2018, tolerating it very well -Last mammogram on 09/21/2017 was within normal limits. -Today's examination shows stable thickening of the right breast lumpectomy area.  No palpable masses.  No palpable adenopathy. - We will see her back in 5 months after her mammogram.  We went over the blood work which was within normal limits.  2.  Bone density: -DEXA scan on 05/20/2017 shows T score of -0.6 in the right femoral neck. - She was counseled to do weightbearing exercises.  She will continue calcium and vitamin D twice daily.      Orders placed this encounter:  Orders Placed This Encounter  Procedures  . MM DIAG BREAST TOMO BILATERAL  . Magnesium  . CBC with Differential/Platelet  . Comprehensive metabolic panel  . Cancer antigen 15-3      Derek Jack, MD [place] [phone]

## 2018-05-13 NOTE — Patient Instructions (Signed)
[place] at [facility] Discharge Instructions  Follow up in 5 months with labs Please have Mammogram done in March   Thank you for choosing [place] at [facility] to provide your oncology and hematology care.  To afford each patient quality time with our provider, please arrive at least 15 minutes before your scheduled appointment time.   If you have a lab appointment with the [facility] please come in thru the  Main Entrance and check in at the main information desk  You need to re-schedule your appointment should you arrive 10 or more minutes late.  We strive to give you quality time with our providers, and arriving late affects you and other patients whose appointments are after yours.  Also, if you no show three or more times for appointments you may be dismissed from the clinic at the providers discretion.     Again, thank you for choosing [facility].  Our hope is that these requests will decrease the amount of time that you wait before being seen by our physicians.       _____________________________________________________________  Should you have questions after your visit to [facility], please contact our office at [phone] between the hours of 8:00 a.m. and 4:30 p.m.  Voicemails left after 4:00 p.m. will not be returned until the following business day.  For prescription refill requests, have your pharmacy contact our office and allow 72 hours.    Cancer Center Support Programs:   > Cancer Support Group  2nd Tuesday of the month 1pm-2pm, Journey Room

## 2018-07-18 ENCOUNTER — Other Ambulatory Visit: Payer: Self-pay | Admitting: Orthopedic Surgery

## 2018-07-18 DIAGNOSIS — M1711 Unilateral primary osteoarthritis, right knee: Secondary | ICD-10-CM

## 2018-07-18 DIAGNOSIS — M25562 Pain in left knee: Secondary | ICD-10-CM

## 2018-07-18 DIAGNOSIS — M25462 Effusion, left knee: Secondary | ICD-10-CM

## 2018-09-12 ENCOUNTER — Other Ambulatory Visit (HOSPITAL_COMMUNITY): Payer: Self-pay | Admitting: Nurse Practitioner

## 2018-09-12 DIAGNOSIS — C50411 Malignant neoplasm of upper-outer quadrant of right female breast: Secondary | ICD-10-CM

## 2018-09-27 ENCOUNTER — Inpatient Hospital Stay (HOSPITAL_COMMUNITY): Payer: 59 | Attending: Internal Medicine

## 2018-09-27 ENCOUNTER — Other Ambulatory Visit (HOSPITAL_COMMUNITY): Payer: Self-pay | Admitting: Nurse Practitioner

## 2018-09-27 ENCOUNTER — Ambulatory Visit (HOSPITAL_COMMUNITY)
Admission: RE | Admit: 2018-09-27 | Discharge: 2018-09-27 | Disposition: A | Discharge status: Home / Self Care | Payer: 59 | Source: Ambulatory Visit | Attending: Nurse Practitioner | Admitting: Nurse Practitioner

## 2018-09-27 ENCOUNTER — Ambulatory Visit (HOSPITAL_COMMUNITY): Admission: RE | Admit: 2018-09-27 | Payer: 59 | Source: Ambulatory Visit

## 2018-09-27 ENCOUNTER — Other Ambulatory Visit: Payer: Self-pay

## 2018-09-27 DIAGNOSIS — C50411 Malignant neoplasm of upper-outer quadrant of right female breast: Secondary | ICD-10-CM | POA: Insufficient documentation

## 2018-09-27 DIAGNOSIS — Z170 Estrogen receptor positive status [ER+]: Secondary | ICD-10-CM | POA: Insufficient documentation

## 2018-09-27 DIAGNOSIS — Z9221 Personal history of antineoplastic chemotherapy: Secondary | ICD-10-CM | POA: Insufficient documentation

## 2018-09-27 DIAGNOSIS — Z79811 Long term (current) use of aromatase inhibitors: Secondary | ICD-10-CM | POA: Insufficient documentation

## 2018-09-27 LAB — CBC WITH DIFFERENTIAL/PLATELET
Abs Immature Granulocytes: 0.02 10*3/uL (ref 0.00–0.07)
Basophils Absolute: 0 10*3/uL (ref 0.0–0.1)
Basophils Relative: 0 %
EOS PCT: 2 %
Eosinophils Absolute: 0.1 10*3/uL (ref 0.0–0.5)
HEMATOCRIT: 40.3 % (ref 36.0–46.0)
HEMOGLOBIN: 13.4 g/dL (ref 12.0–15.0)
Immature Granulocytes: 0 %
LYMPHS ABS: 1.6 10*3/uL (ref 0.7–4.0)
LYMPHS PCT: 30 %
MCH: 28.9 pg (ref 26.0–34.0)
MCHC: 33.3 g/dL (ref 30.0–36.0)
MCV: 87 fL (ref 80.0–100.0)
MONO ABS: 0.4 10*3/uL (ref 0.1–1.0)
Monocytes Relative: 8 %
Neutro Abs: 3.3 10*3/uL (ref 1.7–7.7)
Neutrophils Relative %: 60 %
Platelets: 236 10*3/uL (ref 150–400)
RBC: 4.63 MIL/uL (ref 3.87–5.11)
RDW: 11.8 % (ref 11.5–15.5)
WBC: 5.4 10*3/uL (ref 4.0–10.5)
nRBC: 0 % (ref 0.0–0.2)

## 2018-09-27 LAB — COMPREHENSIVE METABOLIC PANEL
ALK PHOS: 128 U/L — AB (ref 38–126)
ALT: 34 U/L (ref 0–44)
AST: 22 U/L (ref 15–41)
Albumin: 4 g/dL (ref 3.5–5.0)
Anion gap: 10 (ref 5–15)
BUN: 28 mg/dL — ABNORMAL HIGH (ref 6–20)
CO2: 25 mmol/L (ref 22–32)
Calcium: 9.9 mg/dL (ref 8.9–10.3)
Chloride: 105 mmol/L (ref 98–111)
Creatinine, Ser: 1.25 mg/dL — ABNORMAL HIGH (ref 0.44–1.00)
GFR calc non Af Amer: 47 mL/min — ABNORMAL LOW (ref >60)
GFR, EST AFRICAN AMERICAN: 54 mL/min — AB (ref >60)
Glucose, Bld: 182 mg/dL — ABNORMAL HIGH (ref 70–99)
Potassium: 3.4 mmol/L — ABNORMAL LOW (ref 3.5–5.1)
Sodium: 140 mmol/L (ref 135–145)
TOTAL PROTEIN: 7.6 g/dL (ref 6.5–8.1)
Total Bilirubin: 0.4 mg/dL (ref 0.3–1.2)

## 2018-09-27 LAB — MAGNESIUM: Magnesium: 2.1 mg/dL (ref 1.7–2.4)

## 2018-09-28 ENCOUNTER — Other Ambulatory Visit (HOSPITAL_COMMUNITY): Payer: Self-pay | Admitting: Nurse Practitioner

## 2018-09-28 DIAGNOSIS — C50411 Malignant neoplasm of upper-outer quadrant of right female breast: Secondary | ICD-10-CM

## 2018-09-28 LAB — CANCER ANTIGEN 15-3: CA 15-3: 22.5 U/mL (ref 0.0–25.0)

## 2018-10-03 ENCOUNTER — Ambulatory Visit (HOSPITAL_COMMUNITY): Payer: 59 | Admitting: Hematology

## 2018-10-06 ENCOUNTER — Other Ambulatory Visit (HOSPITAL_COMMUNITY): Payer: Self-pay | Admitting: Nurse Practitioner

## 2018-10-06 ENCOUNTER — Other Ambulatory Visit: Payer: Self-pay

## 2018-10-06 ENCOUNTER — Ambulatory Visit
Admission: RE | Admit: 2018-10-06 | Discharge: 2018-10-06 | Disposition: A | Discharge status: Home / Self Care | Payer: 59 | Source: Ambulatory Visit | Attending: Nurse Practitioner | Admitting: Nurse Practitioner

## 2018-10-06 DIAGNOSIS — C50411 Malignant neoplasm of upper-outer quadrant of right female breast: Secondary | ICD-10-CM

## 2018-10-12 ENCOUNTER — Other Ambulatory Visit: Payer: Self-pay

## 2018-10-13 ENCOUNTER — Encounter (HOSPITAL_COMMUNITY): Payer: Self-pay | Admitting: Hematology

## 2018-10-13 ENCOUNTER — Inpatient Hospital Stay (HOSPITAL_COMMUNITY): Payer: 59 | Attending: Internal Medicine | Admitting: Hematology

## 2018-10-13 DIAGNOSIS — C50411 Malignant neoplasm of upper-outer quadrant of right female breast: Secondary | ICD-10-CM

## 2018-10-13 DIAGNOSIS — M1990 Unspecified osteoarthritis, unspecified site: Secondary | ICD-10-CM | POA: Diagnosis not present

## 2018-10-13 DIAGNOSIS — Z170 Estrogen receptor positive status [ER+]: Secondary | ICD-10-CM | POA: Diagnosis not present

## 2018-10-13 DIAGNOSIS — I10 Essential (primary) hypertension: Secondary | ICD-10-CM | POA: Insufficient documentation

## 2018-10-13 DIAGNOSIS — Z79899 Other long term (current) drug therapy: Secondary | ICD-10-CM | POA: Diagnosis not present

## 2018-10-13 DIAGNOSIS — Z79811 Long term (current) use of aromatase inhibitors: Secondary | ICD-10-CM | POA: Diagnosis not present

## 2018-10-13 DIAGNOSIS — Z7982 Long term (current) use of aspirin: Secondary | ICD-10-CM | POA: Insufficient documentation

## 2018-10-13 DIAGNOSIS — Z87891 Personal history of nicotine dependence: Secondary | ICD-10-CM | POA: Diagnosis not present

## 2018-10-13 DIAGNOSIS — E119 Type 2 diabetes mellitus without complications: Secondary | ICD-10-CM | POA: Diagnosis not present

## 2018-10-13 DIAGNOSIS — I110 Hypertensive heart disease with heart failure: Secondary | ICD-10-CM | POA: Diagnosis not present

## 2018-10-13 NOTE — Assessment & Plan Note (Addendum)
1.  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  -Status post right lumpectomy, L&D on 12/30/2016 with PCR, YPT0, ypN0  - Anastrozole was started in August 2018.  -Bilateral mammogram on 09/27/2018 was BI-RADS Category 4 with recommended stereotactic biopsy of the superior aspect of the lumpectomy site of increasing distortion and density. -I reviewed biopsy report from 10/06/2018 of the right breast upper outer quadrant which shows benign breast parenchyma with  fibrosis and hemosiderin-containing macrophages.  Negative for carcinoma. -Physical examination today reveals thickness at the lumpectomy scar in the upper outer quadrant.  No palpable masses.  No palpable adenopathy. -Blood work was also within normal limits.  Mild CKD was stable. -She will come back in 6 months for follow-up with repeat blood work and physical exam.  2.  Bone density: -DEXA scan on 05/20/2017 shows T score of -0.6 in the right femoral neck. -She will continue calcium and vitamin D twice daily.  She was counseled to do weightbearing exercises. -I plan to repeat DEXA scan prior to next visit in 6 months.  We will also check a vitamin D level.

## 2018-10-13 NOTE — Progress Notes (Signed)
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  4.1 x 6 cm right axillar lymph node also identified.    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    07/21/2016 Imaging    CT CAP- 2.9 cm right upper outer quadrant breast mass, consistent with known primary breast carcinoma.  Right axillary and subpectoral lymphadenopathy, consistent with metastatic disease.  Tiny sub-cm bilateral pulmonary nodules are indeterminate, but favor postinflammatory etiology over early metastases. Recommend continued attention on follow-up CT.  No evidence of metastatic disease within the abdomen or pelvis.    07/23/2016 Procedure    Port placed by Dr. Barry Dienes    07/24/2016 - 12/04/2016 Neo-Adjuvant Chemotherapy    Adriamycin/Cytoxan x 4, followed by weekly Taxol x 12     07/30/2016 Imaging    Bone scan: Negative for osseous disease .    09/22/2016 Imaging    CT head- 1. Normal CT of the brain. No evidence of intracranial metastatic disease. 2. Frothy secretions  throughout most of the paranasal sinuses. Correlate clinically for acute sinusitis. This could serve as a source of headache.    12/30/2016 Surgery    Right lumpectomy with axillary lymph node dissection by Dr. Arnoldo Morale. Surgical path: 1. Breast, lumpectomy, right - FIBROCYSTIC AND FIBROADENOMATOID CHANGE. - USUAL DUCTAL HYPERPLASIA. - TREATMENT EFFECT. - NO RESIDUAL CARCINOMA IDENTIFIED. - SEE ONCOLOGY TABLE. 2. Lymph nodes, regional resection, right axillary contents - NINE OF NINE LYMPH NODES NEGATIVE FOR CARCINOMA (0/9). - TREATMENT EFFECT. Final stage ypT0 ypN0      CANCER STAGING: Cancer Staging Breast cancer of upper-outer quadrant of right female breast [facility]) Staging form: Breast, AJCC 8th Edition - Clinical stage from 08/07/2016: No Stage Recommended (ycT2, cN1(sn), cM0, G3, ER: Positive, PR: Negative, HER2: Negative) - Signed by Baird Cancer, PA-C on 08/07/2016    INTERVAL HISTORY:  [name] 63 y.o. female returns for routine follow-up.  She is here today alone. She reports that she has been doing well. She has been taking the anastrozole daily.  She denies having any hot flashes, muscle aches or joint pains. Denies any nausea, vomiting, or diarrhea. She has not noticed any recent bleeding such as epistaxis, hematuria or hematochezia. Denies recent chest pain on exertion, shortness of breath on minimal exertion, pre-syncopal episodes, or palpitations. Denies any numbness or tingling in hands or feet. Denies any recent fevers, infections, or recent hospitalizations. Patient reports appetite at 100% and energy level at 100%.  REVIEW OF SYSTEMS:  Review of Systems  All other systems reviewed and are negative.    PAST MEDICAL/SURGICAL HISTORY:  Past Medical History:  Diagnosis Date  . Arthritis   . Breast cancer of upper-outer quadrant of right female breast ([place]) 07/14/2016  . Cancer ([place])   . Cough    dry  . Diabetes mellitus without complication ([place])   .  Hypertension   . Ruptured eardrum    left    Past Surgical History:  Procedure Laterality Date  . ABDOMINAL HYSTERECTOMY     with bilateral bso  . AXILLARY LYMPH NODE DISSECTION Right 12/30/2016   Procedure: AXILLARY LYMPH NODE DISSECTION;  Surgeon: Aviva Signs, MD;  Location: [facility];  Service: General;  Laterality: Right;  . COLONOSCOPY    . otif wrist Right   . PARTIAL MASTECTOMY WITH NEEDLE LOCALIZATION Right 12/30/2016   Procedure: PARTIAL MASTECTOMY WITH NEEDLE LOCALIZATION;  Surgeon: Aviva Signs, MD;  Location: [facility];  Service: General;  Laterality: Right;  . PORTACATH PLACEMENT Left 07/23/2016   Procedure: INSERTION PORT-A-CATH;  Surgeon: Stark Klein, MD;  Location: [facility];  Service: General;  Laterality: Left;  . RADIOLOGY WITH ANESTHESIA N/A 08/19/2017   Procedure: MRI WITH ANESTHESIA OF BRAIN WITHOUT CONTRAST;  Surgeon: Radiologist, Medication, MD;  Location: [facility];  Service: Radiology;  Laterality: N/A;  . ruptured ear drum Left      SOCIAL HISTORY:  Social History   Socioeconomic History  . Marital status: Single    Spouse name: Not on file  . Number of children: Not on file  . Years of education: Not on file  . Highest education level: Not on file  Occupational History  . Not on file  Social Needs  . Financial resource strain: Not on file  . Food insecurity:    Worry: Not on file    Inability: Not on file  . Transportation needs:    Medical: Not on file    Non-medical: Not on file  Tobacco Use  . Smoking status: Former Smoker    Packs/day: 0.50    Years: 4.00    Pack years: 2.00    Types: Cigarettes  . Smokeless tobacco: Never Used  Substance and Sexual Activity  . Alcohol use: No  . Drug use: No  . Sexual activity: Not Currently    Birth control/protection: Surgical  Lifestyle  . Physical activity:    Days per week: Not on file    Minutes per session: Not on file  . Stress: Not on file  Relationships  . Social connections:     Talks on phone: Not on file    Gets together: Not on file    Attends religious service: Not on file    Active member of club or organization: Not on file    Attends meetings of clubs or organizations: Not on file    Relationship status: Not on file  . Intimate partner violence:    Fear of current or ex partner: Not on file    Emotionally abused: Not on file    Physically abused: Not on file    Forced sexual activity: Not on file  Other Topics Concern  . Not on file  Social History Narrative  . Not on file    FAMILY HISTORY:  Family History  Problem Relation Age of Onset  . Heart disease Mother   . Diabetes Mother   . Liver disease Father   . Stroke Brother  CURRENT MEDICATIONS:  Outpatient Encounter Medications as of 10/13/2018  Medication Sig  . amLODipine (NORVASC) 10 MG tablet Take 10 mg by mouth daily.  [name] anastrozole (ARIMIDEX) 1 MG tablet Take 1 tablet (1 mg total) by mouth daily.  [name] aspirin EC 81 MG tablet Take 81 mg by mouth daily.  [name] atorvastatin (LIPITOR) 10 MG tablet Take 10 mg by mouth daily at 6 PM.   . cholecalciferol (VITAMIN D) 1000 units tablet Take 1,000 Units by mouth daily.  . diclofenac (VOLTAREN) 75 MG EC tablet TAKE 1 TABLET (75 MG TOTAL) BY MOUTH 2 (TWO) TIMES DAILY WITH A MEAL.  . fluticasone (FLONASE) 50 MCG/ACT nasal spray Place 2 sprays into both nostrils daily as needed for allergies or rhinitis.  [name] glyBURIDE (DIABETA) 5 MG tablet   . glyBURIDE-metformin (GLUCOVANCE) 5-500 MG tablet Take 1 tablet by mouth 2 (two) times daily.  . hydrochlorothiazide (HYDRODIURIL) 25 MG tablet Take 25 mg by mouth daily.  [name] ibuprofen (ADVIL,MOTRIN) 800 MG tablet   . lisinopril (PRINIVIL,ZESTRIL) 40 MG tablet Take 40 mg by mouth daily.  . pioglitazone (ACTOS) 15 MG tablet Take 15 mg by mouth daily.   No facility-administered encounter medications on file as of 10/13/2018.     ALLERGIES:  No Known Allergies   PHYSICAL EXAM:  ECOG Performance status: 1   Vitals:   10/13/18 1308  BP: 107/77  Pulse: 80  Resp: 16  Temp: 98.8 F (37.1 C)  SpO2: 100%   Filed Weights   10/13/18 1308  Weight: 184 lb 6.4 oz (83.6 kg)    Physical Exam Vitals signs reviewed.  Constitutional:      Appearance: Normal appearance.  Cardiovascular:     Rate and Rhythm: Normal rate and regular rhythm.     Heart sounds: Normal heart sounds.  Pulmonary:     Effort: Pulmonary effort is normal.     Breath sounds: Normal breath sounds.  Abdominal:     General: Bowel sounds are normal. There is no distension.     Palpations: Abdomen is soft.  Musculoskeletal:        General: No swelling.  Skin:    General: Skin is warm.  Neurological:     General: No focal deficit present.     Mental Status: She is alert and oriented to person, place, and time.  Psychiatric:        Mood and Affect: Mood normal.        Behavior: Behavior normal.      LABORATORY DATA:  I have reviewed the labs as listed.  CBC    Component Value Date/Time   WBC 5.4 09/27/2018 0959   RBC 4.63 09/27/2018 0959   HGB 13.4 09/27/2018 0959   HCT 40.3 09/27/2018 0959   PLT 236 09/27/2018 0959   MCV 87.0 09/27/2018 0959   MCH 28.9 09/27/2018 0959   MCHC 33.3 09/27/2018 0959   RDW 11.8 09/27/2018 0959   LYMPHSABS 1.6 09/27/2018 0959   MONOABS 0.4 09/27/2018 0959   EOSABS 0.1 09/27/2018 0959   BASOSABS 0.0 09/27/2018 0959   CMP Latest Ref Rng & Units 09/27/2018 05/06/2018 01/11/2018  Glucose 70 - 99 mg/dL 182(H) 185(H) -  BUN 6 - 20 mg/dL 28(H) 24(H) -  Creatinine 0.44 - 1.00 mg/dL 1.25(H) 1.22(H) -  Sodium 135 - 145 mmol/L 140 138 -  Potassium 3.5 - 5.1 mmol/L 3.4(L) 3.6 -  Chloride 98 - 111 mmol/L 105 104 -  CO2 22 - 32 mmol/L 25 24 -  Calcium 8.9 - 10.3 mg/dL 9.9 9.5 -  Total Protein 6.5 - 8.1 g/dL 7.6 7.2 7.1  Total Bilirubin 0.3 - 1.2 mg/dL 0.4 0.5 0.8  Alkaline Phos 38 - 126 U/L 128(H) 82 78  AST 15 - 41 U/L 22 27 23   ALT 0 - 44 U/L 34 28 22       DIAGNOSTIC IMAGING:   I have independently reviewed the scans and discussed with the patient.   I have reviewed Christine Every, RN's note and agree with the documentation.  I personally performed a face-to-face visit, made revisions and my assessment and plan is as follows.    ASSESSMENT & PLAN:   Breast cancer of upper-outer quadrant of right female breast ([place]) 1.  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  -Status post right lumpectomy, L&D on 12/30/2016 with PCR, YPT0, ypN0  - Anastrozole was started in August 2018.  -Bilateral mammogram on 09/27/2018 was BI-RADS Category 4 with recommended stereotactic biopsy of the superior aspect of the lumpectomy site of increasing distortion and density. -I reviewed biopsy report from 10/06/2018 of the right breast upper outer quadrant which shows benign breast parenchyma with  fibrosis and hemosiderin-containing macrophages.  Negative for carcinoma. -Physical examination today reveals thickness at the lumpectomy scar in the upper outer quadrant.  No palpable masses.  No palpable adenopathy. -Blood work was also within normal limits.  Mild CKD was stable. -She will come back in 6 months for follow-up with repeat blood work and physical exam.  2.  Bone density: -DEXA scan on 05/20/2017 shows T score of -0.6 in the right femoral neck. -She will continue calcium and vitamin D twice daily.  She was counseled to do weightbearing exercises. -I plan to repeat DEXA scan prior to next visit in 6 months.  We will also check a vitamin D level.   Total time spent is 25 minutes with more than 50% of the time spent face-to-face discussing pathology report, continuation of anastrozole and coordination of care.    Orders placed this encounter:  Orders Placed This Encounter  Procedures  . DG Bone Density  . CBC with  Differential/Platelet  . Comprehensive metabolic panel  . Vitamin D 25 hydroxy      Derek Jack, MD [place] [phone]

## 2018-10-13 NOTE — Patient Instructions (Addendum)
[place] at [facility] Discharge Instructions  You were seen today by Dr. Delton Coombes.  He reviewed your biopsy and it is negative for any cancer.  We want you to lift small weights with your hands to help strengthen your bones.  Continue to take vitamin d and your bone supplement that you have been taking.  We will do a bone scan in 6 months.  We will see you again in 6 months with labs.    Thank you for choosing [place] at [facility] to provide your oncology and hematology care.  To afford each patient quality time with our provider, please arrive at least 15 minutes before your scheduled appointment time.   If you have a lab appointment with the [place] please come in thru the  Main Entrance and check in at the main information desk  You need to re-schedule your appointment should you arrive 10 or more minutes late.  We strive to give you quality time with our providers, and arriving late affects you and other patients whose appointments are after yours.  Also, if you no show three or more times for appointments you may be dismissed from the clinic at the providers discretion.     Again, thank you for choosing [facility].  Our hope is that these requests will decrease the amount of time that you wait before being seen by our physicians.       _____________________________________________________________  Should you have questions after your visit to [facility], please contact our office at [phone] between the hours of 8:00 a.m. and 4:30 p.m.  Voicemails left after 4:00 p.m. will not be returned until the following business day.  For prescription refill requests, have your pharmacy contact our office and allow 72 hours.    Cancer Center Support Programs:   > Cancer Support Group  2nd Tuesday of the month 1pm-2pm, Journey Room

## 2018-10-26 ENCOUNTER — Ambulatory Visit: Payer: 59 | Admitting: Orthopedic Surgery

## 2018-11-08 ENCOUNTER — Other Ambulatory Visit (HOSPITAL_COMMUNITY): Payer: Self-pay | Admitting: Hematology

## 2018-11-11 ENCOUNTER — Other Ambulatory Visit: Payer: Self-pay | Admitting: Orthopedic Surgery

## 2018-11-11 DIAGNOSIS — M25462 Effusion, left knee: Secondary | ICD-10-CM

## 2018-11-11 DIAGNOSIS — M1711 Unilateral primary osteoarthritis, right knee: Secondary | ICD-10-CM

## 2018-11-11 DIAGNOSIS — M25562 Pain in left knee: Secondary | ICD-10-CM

## 2018-11-17 ENCOUNTER — Telehealth: Payer: Self-pay | Admitting: Orthopedic Surgery

## 2018-11-17 NOTE — Telephone Encounter (Signed)
Called patient; scheduled appointment; patient aware.

## 2018-11-17 NOTE — Telephone Encounter (Signed)
Patient has question, in addition to separate refill request note - asking about appointment for "knot" behind left knee; said it is not bothering her as much as her 'chronic' pain in right knee. Please advise if okay to schedule virtual visit?  Ph [phone]

## 2018-11-17 NOTE — Telephone Encounter (Signed)
Patient called regarding refill request (chart indicates electronic interface/surescript request on 11/11/18): diclofenac (VOLTAREN) 75 MG EC tablet [facility]

## 2018-11-17 NOTE — Telephone Encounter (Signed)
If she has a knot behind her knee, that is new, we need to see her, in person  As long as she screens negative.

## 2018-12-02 ENCOUNTER — Encounter: Payer: Self-pay | Admitting: Orthopedic Surgery

## 2018-12-02 ENCOUNTER — Other Ambulatory Visit: Payer: Self-pay

## 2018-12-02 ENCOUNTER — Ambulatory Visit (INDEPENDENT_AMBULATORY_CARE_PROVIDER_SITE_OTHER): Payer: 59

## 2018-12-02 ENCOUNTER — Ambulatory Visit (INDEPENDENT_AMBULATORY_CARE_PROVIDER_SITE_OTHER): Payer: 59 | Admitting: Orthopedic Surgery

## 2018-12-02 VITALS — BP 135/84 | HR 73 | Temp 97.4°F | Ht 64.0 in | Wt 186.0 lb

## 2018-12-02 DIAGNOSIS — M1711 Unilateral primary osteoarthritis, right knee: Secondary | ICD-10-CM

## 2018-12-02 MED ORDER — TRAMADOL-ACETAMINOPHEN 37.5-325 MG PO TABS: 1.0000 | ORAL_TABLET | ORAL | 5 refills | Status: DC | PRN

## 2018-12-02 NOTE — Patient Instructions (Signed)
Weight loss  Exercises   Diclofenac continue   Add ultracet

## 2018-12-02 NOTE — Progress Notes (Signed)
NEW PROBLEM OFFICE VISIT  Chief Complaint  Patient presents with  . Knee Pain    Bilat but has "knot" on back of left knee    63 year old female presents with a new problem right knee pain  The patient is hard of hearing on the left side.  She complains of 3 to 71-month history of atraumatic onset of right knee pain.  She is been on diclofenac it looks like 75 mg twice a day no relief  Pain is located diffuse right knee quality dull severity moderate duration as stated timing exercise related with increased pain   Review of Systems  Constitutional: Positive for diaphoresis.  HENT: Positive for hearing loss and tinnitus.   All other systems reviewed and are negative.    Past Medical History:  Diagnosis Date  . Arthritis   . Breast cancer of upper-outer quadrant of right female breast ([place]) 07/14/2016  . Cancer ([place])   . Cough    dry  . Diabetes mellitus without complication ([place])   . Hypertension   . Ruptured eardrum    left     Past Surgical History:  Procedure Laterality Date  . ABDOMINAL HYSTERECTOMY     with bilateral bso  . AXILLARY LYMPH NODE DISSECTION Right 12/30/2016   Procedure: AXILLARY LYMPH NODE DISSECTION;  Surgeon: Aviva Signs, MD;  Location: [facility];  Service: General;  Laterality: Right;  . COLONOSCOPY    . otif wrist Right   . PARTIAL MASTECTOMY WITH NEEDLE LOCALIZATION Right 12/30/2016   Procedure: PARTIAL MASTECTOMY WITH NEEDLE LOCALIZATION;  Surgeon: Aviva Signs, MD;  Location: [facility];  Service: General;  Laterality: Right;  . PORTACATH PLACEMENT Left 07/23/2016   Procedure: INSERTION PORT-A-CATH;  Surgeon: Stark Klein, MD;  Location: [place];  Service: General;  Laterality: Left;  . RADIOLOGY WITH ANESTHESIA N/A 08/19/2017   Procedure: MRI WITH ANESTHESIA OF BRAIN WITHOUT CONTRAST;  Surgeon: Radiologist, Medication, MD;  Location: [facility];  Service: Radiology;  Laterality: N/A;  . ruptured ear drum Left     Family History   Problem Relation Age of Onset  . Heart disease Mother   . Diabetes Mother   . Liver disease Father   . Stroke Brother    Social History   Tobacco Use  . Smoking status: Former Smoker    Packs/day: 0.50    Years: 4.00    Pack years: 2.00    Types: Cigarettes  . Smokeless tobacco: Never Used  Substance Use Topics  . Alcohol use: No  . Drug use: No    No Known Allergies  Current Meds  Medication Sig  . amLODipine (NORVASC) 10 MG tablet Take 10 mg by mouth daily.  [name] anastrozole (ARIMIDEX) 1 MG tablet TAKE 1 TABLET BY MOUTH EVERY DAY  . aspirin EC 81 MG tablet Take 81 mg by mouth daily.  [name] atorvastatin (LIPITOR) 10 MG tablet Take 10 mg by mouth daily at 6 PM.   . Calcium 250 MG CAPS Take 500 mg by mouth.  . cholecalciferol (VITAMIN D) 1000 units tablet Take 1,000 Units by mouth daily.  . diclofenac (VOLTAREN) 75 MG EC tablet TAKE 1 TABLET (75 MG TOTAL) BY MOUTH 2 (TWO) TIMES DAILY WITH A MEAL.  [name] glyBURIDE (DIABETA) 5 MG tablet   . hydrochlorothiazide (HYDRODIURIL) 25 MG tablet Take 25 mg by mouth daily.  [name] lisinopril (PRINIVIL,ZESTRIL) 40 MG tablet Take 40 mg by mouth daily.  . pioglitazone (ACTOS) 15 MG tablet Take 15 mg by  mouth daily.  . vitamin B-12 (CYANOCOBALAMIN) 500 MCG tablet Take 500 mcg by mouth daily.    BP 135/84   Pulse 73   Temp (!) 97.4 F (36.3 C)   Ht 5\' 4"  (1.626 m)   Wt 186 lb (84.4 kg)   BMI 31.93 kg/m   Physical Exam Vitals signs and nursing note reviewed.  Constitutional:      Appearance: Normal appearance.  Musculoskeletal:     Right knee: She exhibits no effusion.  Neurological:     Mental Status: She is alert and oriented to person, place, and time.  Psychiatric:        Mood and Affect: Mood normal.     Right Knee Exam   Muscle Strength  The patient has normal right knee strength.  Tenderness  The patient is experiencing tenderness in the medial joint line.  Range of Motion  Extension: normal  Flexion: normal   Tests   McMurray:  Medial - negative Lateral - negative Varus: negative Valgus: negative Drawer:  Anterior - negative    Posterior - negative  Other  Erythema: absent Scars: absent Sensation: normal Pulse: present Swelling: none Effusion: no effusion present   Left Knee Exam   Muscle Strength  The patient has normal left knee strength.  Tenderness  The patient is experiencing no tenderness.   Range of Motion  Extension: normal  Flexion: normal   Tests  McMurray:  Medial - negative Lateral - negative Varus: negative Valgus: negative Drawer:  Anterior - negative     Posterior - negative  Other  Erythema: absent Scars: absent Sensation: normal Pulse: present Swelling: none        MEDICAL DECISION SECTION  Xrays were done at [facility]  My independent reading of xrays:  See dictated report x-ray shows osteoarthritis medial compartment with mild varus  Encounter Diagnosis  Name Primary?  . Primary osteoarthritis of right knee Yes    PLAN: (Rx., injectx, surgery, frx, mri/ct) We offered the patient several options Weight loss Knee exercises Knee injection Bracing  She already has a knee brace she opted for knee exercises without an injection and we talked her about losing weight  Follow-up as needed, add Ultracet for pain with her diclofenac 75 twice a day    Meds ordered this encounter  Medications  . traMADol-acetaminophen (ULTRACET) 37.5-325 MG tablet    Sig: Take 1 tablet by mouth every 4 (four) hours as needed.    Dispense:  90 tablet    Refill:  5    Arther Abbott, MD  12/02/2018 10:54 AM

## 2018-12-05 ENCOUNTER — Ambulatory Visit: Payer: 59 | Admitting: Orthopedic Surgery

## 2019-02-15 ENCOUNTER — Other Ambulatory Visit: Payer: Self-pay | Admitting: Orthopedic Surgery

## 2019-02-15 DIAGNOSIS — M25462 Effusion, left knee: Secondary | ICD-10-CM

## 2019-02-15 DIAGNOSIS — M25562 Pain in left knee: Secondary | ICD-10-CM

## 2019-02-15 DIAGNOSIS — M1711 Unilateral primary osteoarthritis, right knee: Secondary | ICD-10-CM

## 2019-05-14 ENCOUNTER — Other Ambulatory Visit: Payer: Self-pay | Admitting: Orthopedic Surgery

## 2019-05-14 DIAGNOSIS — M1711 Unilateral primary osteoarthritis, right knee: Secondary | ICD-10-CM

## 2019-05-14 DIAGNOSIS — M25462 Effusion, left knee: Secondary | ICD-10-CM

## 2019-05-22 ENCOUNTER — Ambulatory Visit (HOSPITAL_COMMUNITY): Admission: RE | Admit: 2019-05-22 | Payer: 59 | Source: Ambulatory Visit

## 2019-05-22 ENCOUNTER — Other Ambulatory Visit: Payer: Self-pay

## 2019-05-22 ENCOUNTER — Inpatient Hospital Stay (HOSPITAL_COMMUNITY): Payer: 59 | Attending: Hematology

## 2019-05-22 DIAGNOSIS — Z8249 Family history of ischemic heart disease and other diseases of the circulatory system: Secondary | ICD-10-CM | POA: Insufficient documentation

## 2019-05-22 DIAGNOSIS — Z87891 Personal history of nicotine dependence: Secondary | ICD-10-CM | POA: Diagnosis not present

## 2019-05-22 DIAGNOSIS — Z7984 Long term (current) use of oral hypoglycemic drugs: Secondary | ICD-10-CM | POA: Diagnosis not present

## 2019-05-22 DIAGNOSIS — C50411 Malignant neoplasm of upper-outer quadrant of right female breast: Secondary | ICD-10-CM | POA: Diagnosis not present

## 2019-05-22 DIAGNOSIS — E119 Type 2 diabetes mellitus without complications: Secondary | ICD-10-CM | POA: Diagnosis not present

## 2019-05-22 DIAGNOSIS — I10 Essential (primary) hypertension: Secondary | ICD-10-CM | POA: Diagnosis not present

## 2019-05-22 DIAGNOSIS — Z79899 Other long term (current) drug therapy: Secondary | ICD-10-CM | POA: Insufficient documentation

## 2019-05-22 DIAGNOSIS — Z79811 Long term (current) use of aromatase inhibitors: Secondary | ICD-10-CM | POA: Insufficient documentation

## 2019-05-22 DIAGNOSIS — Z7982 Long term (current) use of aspirin: Secondary | ICD-10-CM | POA: Insufficient documentation

## 2019-05-22 DIAGNOSIS — Z170 Estrogen receptor positive status [ER+]: Secondary | ICD-10-CM | POA: Diagnosis not present

## 2019-05-22 DIAGNOSIS — Z9221 Personal history of antineoplastic chemotherapy: Secondary | ICD-10-CM | POA: Diagnosis not present

## 2019-05-22 DIAGNOSIS — Z9079 Acquired absence of other genital organ(s): Secondary | ICD-10-CM | POA: Insufficient documentation

## 2019-05-22 DIAGNOSIS — Z791 Long term (current) use of non-steroidal anti-inflammatories (NSAID): Secondary | ICD-10-CM | POA: Diagnosis not present

## 2019-05-22 DIAGNOSIS — Z90722 Acquired absence of ovaries, bilateral: Secondary | ICD-10-CM | POA: Diagnosis not present

## 2019-05-22 DIAGNOSIS — Z90710 Acquired absence of both cervix and uterus: Secondary | ICD-10-CM | POA: Diagnosis not present

## 2019-05-22 LAB — CBC WITH DIFFERENTIAL/PLATELET
Abs Immature Granulocytes: 0.01 10*3/uL (ref 0.00–0.07)
Basophils Absolute: 0 10*3/uL (ref 0.0–0.1)
Basophils Relative: 1 %
Eosinophils Absolute: 0.1 10*3/uL (ref 0.0–0.5)
Eosinophils Relative: 2 %
HCT: 34.9 % — ABNORMAL LOW (ref 36.0–46.0)
Hemoglobin: 11.4 g/dL — ABNORMAL LOW (ref 12.0–15.0)
Immature Granulocytes: 0 %
Lymphocytes Relative: 27 %
Lymphs Abs: 1.5 10*3/uL (ref 0.7–4.0)
MCH: 29.5 pg (ref 26.0–34.0)
MCHC: 32.7 g/dL (ref 30.0–36.0)
MCV: 90.2 fL (ref 80.0–100.0)
Monocytes Absolute: 0.4 10*3/uL (ref 0.1–1.0)
Monocytes Relative: 7 %
Neutro Abs: 3.5 10*3/uL (ref 1.7–7.7)
Neutrophils Relative %: 63 %
Platelets: 232 10*3/uL (ref 150–400)
RBC: 3.87 MIL/uL (ref 3.87–5.11)
RDW: 12.4 % (ref 11.5–15.5)
WBC: 5.5 10*3/uL (ref 4.0–10.5)
nRBC: 0 % (ref 0.0–0.2)

## 2019-05-22 LAB — COMPREHENSIVE METABOLIC PANEL
ALT: 25 U/L (ref 0–44)
AST: 16 U/L (ref 15–41)
Albumin: 3.8 g/dL (ref 3.5–5.0)
Alkaline Phosphatase: 110 U/L (ref 38–126)
Anion gap: 12 (ref 5–15)
BUN: 34 mg/dL — ABNORMAL HIGH (ref 8–23)
CO2: 26 mmol/L (ref 22–32)
Calcium: 9.6 mg/dL (ref 8.9–10.3)
Chloride: 103 mmol/L (ref 98–111)
Creatinine, Ser: 1.11 mg/dL — ABNORMAL HIGH (ref 0.44–1.00)
GFR calc Af Amer: >60 mL/min (ref >60)
GFR calc non Af Amer: 54 mL/min — ABNORMAL LOW (ref >60)
Glucose, Bld: 120 mg/dL — ABNORMAL HIGH (ref 70–99)
Potassium: 3.7 mmol/L (ref 3.5–5.1)
Sodium: 141 mmol/L (ref 135–145)
Total Bilirubin: 0.7 mg/dL (ref 0.3–1.2)
Total Protein: 7.1 g/dL (ref 6.5–8.1)

## 2019-05-23 LAB — VITAMIN D 25 HYDROXY (VIT D DEFICIENCY, FRACTURES): Vit D, 25-Hydroxy: 56 ng/mL (ref 30–100)

## 2019-05-25 ENCOUNTER — Other Ambulatory Visit: Payer: Self-pay

## 2019-05-25 ENCOUNTER — Encounter (HOSPITAL_COMMUNITY): Payer: Self-pay | Admitting: Hematology

## 2019-05-25 ENCOUNTER — Inpatient Hospital Stay (HOSPITAL_BASED_OUTPATIENT_CLINIC_OR_DEPARTMENT_OTHER): Payer: 59 | Admitting: Hematology

## 2019-05-25 VITALS — BP 135/77 | HR 89 | Temp 96.9°F | Resp 16 | Wt 198.3 lb

## 2019-05-25 DIAGNOSIS — Z170 Estrogen receptor positive status [ER+]: Secondary | ICD-10-CM | POA: Diagnosis not present

## 2019-05-25 DIAGNOSIS — C50411 Malignant neoplasm of upper-outer quadrant of right female breast: Secondary | ICD-10-CM

## 2019-05-25 NOTE — Patient Instructions (Signed)
[facility] at [facility]  Discharge Instructions:  You saw Renee Nester, NP, today. _______________________________________________________________  Thank you for choosing [facility] at [facility] to provide your oncology and hematology care.  To afford each patient quality time with our providers, please arrive at least 15 minutes before your scheduled appointment.  You need to re-schedule your appointment if you arrive 10 or more minutes late.  We strive to give you quality time with our providers, and arriving late affects you and other patients whose appointments are after yours.  Also, if you no show three or more times for appointments you may be dismissed from the clinic.  Again, thank you for choosing [facility] at [facility]. Our hope is that these requests will allow you access to exceptional care and in a timely manner. _______________________________________________________________  If you have questions after your visit, please contact our office at [phone] between the hours of 8:30 a.m. and 5:00 p.m. Voicemails left after 4:30 p.m. will not be returned until the following business day. _______________________________________________________________  For prescription refill requests, have your pharmacy contact our office. _______________________________________________________________  Recommendations made by the consultant and any test results will be sent to your referring physician. _______________________________________________________________ 

## 2019-05-26 NOTE — Assessment & Plan Note (Signed)
1.  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  -Status post right lumpectomy, L&D on 12/30/2016 with PCR, YPT0, ypN0  - Anastrozole was started in August 2018.  -Bilateral mammogram on 09/27/2018 was BI-RADS Category 4 with recommended stereotactic biopsy of the superior aspect of the lumpectomy site of increasing distortion and density. -I reviewed biopsy report from 10/06/2018 of the right breast upper outer quadrant which shows benign breast parenchyma with  fibrosis and hemosiderin-containing macrophages.  Negative for carcinoma. -Physical examination today reveals thickness at the lumpectomy scar in the upper outer quadrant.  No palpable masses.  No palpable adenopathy. -Labs remain stable.  Plan to repeat mammogram in April 2021. -Patient will return to clinic in 6 months.  2.  Bone density: -DEXA scan on 05/20/2017 shows T score of -0.6 in the right femoral neck. -She will continue calcium and vitamin D twice daily.  She was counseled to do weightbearing exercises. -I plan to repeat DEXA scan prior to next visit in 6 months.  We will also check a vitamin D level.

## 2019-05-26 NOTE — Progress Notes (Signed)
[address] [place], [place] [address]   CLINIC:  Medical Oncology/Hematology  PCP:  Monico Blitz, [place] [address] [phone]   REASON FOR VISIT:  Follow-up for breast cancer  CURRENT THERAPY: Anastrozole  BRIEF ONCOLOGIC HISTORY:  Oncology History  Breast cancer of upper-outer quadrant of right female breast ([place])  06/24/2016 Mammogram   Diagnostic bilateral mammogram: highly suspicious 3.5 cm mass in the UO R breast with marked enlarged R axillary LN   06/24/2016 Breast US   2.7 x 3.1 x 3.5 cm slightly irregular hypoechoic mass at the 10:00 position of right breast, 6 cm from the nipple.  4.1 x 6 cm right axillar lymph node also identified.   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   07/21/2016 Imaging   CT CAP- 2.9 cm right upper outer quadrant breast mass, consistent with known primary breast carcinoma.  Right axillary and subpectoral lymphadenopathy, consistent with metastatic disease.  Tiny sub-cm bilateral pulmonary nodules are indeterminate, but favor postinflammatory etiology over early metastases. Recommend continued attention on follow-up CT.  No evidence of metastatic disease within the abdomen or pelvis.   07/23/2016 Procedure   Port placed by Dr. Barry Dienes   07/24/2016 - 12/04/2016 Neo-Adjuvant Chemotherapy   Adriamycin/Cytoxan x 4, followed by weekly Taxol x 12    07/30/2016 Imaging   Bone scan: Negative for osseous disease .   09/22/2016 Imaging   CT head- 1. Normal CT of the brain. No evidence of intracranial metastatic disease. 2. Frothy secretions throughout most of the paranasal sinuses. Correlate clinically for acute  sinusitis. This could serve as a source of headache.   12/30/2016 Surgery   Right lumpectomy with axillary lymph node dissection by Dr. Arnoldo Morale. Surgical path: 1. Breast, lumpectomy, right - FIBROCYSTIC AND FIBROADENOMATOID CHANGE. - USUAL DUCTAL HYPERPLASIA. - TREATMENT EFFECT. - NO RESIDUAL CARCINOMA IDENTIFIED. - SEE ONCOLOGY TABLE. 2. Lymph nodes, regional resection, right axillary contents - NINE OF NINE LYMPH NODES NEGATIVE FOR CARCINOMA (0/9). - TREATMENT EFFECT. Final stage ypT0 ypN0      CANCER STAGING: Cancer Staging Breast cancer of upper-outer quadrant of right female breast [facility]) Staging form: Breast, AJCC 8th Edition - Clinical stage from 08/07/2016: No Stage Recommended (ycT2, cN1(sn), cM0, G3, ER: Positive, PR: Negative, HER2: Negative) - Signed by Baird Cancer, PA-C on 08/07/2016    INTERVAL HISTORY:  [name] 63 y.o. female she presents today for follow-up.  She reports overall doing well.  She denies any changes in her health history since her last visit.  No changes in breasts.  No new lumps, masses, nipple inversion or discharge.  Denies any changes in bowel habits.  Appetite is stable.  No weight loss.  She is here for repeat labs and office visit.   REVIEW OF SYSTEMS:  Review of Systems  Musculoskeletal: Positive for arthralgias and myalgias.  Neurological: Positive for extremity weakness.  All other systems reviewed and are negative.    PAST MEDICAL/SURGICAL HISTORY:  Past Medical History:  Diagnosis Date  . Arthritis   . Breast cancer of upper-outer quadrant of right female breast ([place]) 07/14/2016  . Cancer ([place])   . Cough  dry  . Diabetes mellitus without complication ([place])   . Hypertension   . Ruptured eardrum    left    Past Surgical History:  Procedure Laterality Date  . ABDOMINAL HYSTERECTOMY     with bilateral bso  . AXILLARY LYMPH NODE DISSECTION Right 12/30/2016   Procedure: AXILLARY LYMPH NODE DISSECTION;  Surgeon: Aviva Signs, MD;  Location: [facility];  Service: General;  Laterality: Right;  . COLONOSCOPY    . otif wrist Right   . PARTIAL MASTECTOMY WITH NEEDLE LOCALIZATION Right 12/30/2016   Procedure: PARTIAL MASTECTOMY WITH NEEDLE LOCALIZATION;  Surgeon: Aviva Signs, MD;  Location: [facility];  Service: General;  Laterality: Right;  . PORTACATH PLACEMENT Left 07/23/2016   Procedure: INSERTION PORT-A-CATH;  Surgeon: Stark Klein, MD;  Location: [facility];  Service: General;  Laterality: Left;  . RADIOLOGY WITH ANESTHESIA N/A 08/19/2017   Procedure: MRI WITH ANESTHESIA OF BRAIN WITHOUT CONTRAST;  Surgeon: Radiologist, Medication, MD;  Location: [facility];  Service: Radiology;  Laterality: N/A;  . ruptured ear drum Left      SOCIAL HISTORY:  Social History   Socioeconomic History  . Marital status: Single    Spouse name: Not on file  . Number of children: Not on file  . Years of education: Not on file  . Highest education level: Not on file  Occupational History  . Not on file  Social Needs  . Financial resource strain: Not on file  . Food insecurity    Worry: Not on file    Inability: Not on file  . Transportation needs    Medical: Not on file    Non-medical: Not on file  Tobacco Use  . Smoking status: Former Smoker    Packs/day: 0.50    Years: 4.00    Pack years: 2.00    Types: Cigarettes  . Smokeless tobacco: Never Used  Substance and Sexual Activity  . Alcohol use: No  . Drug use: No  . Sexual activity: Not Currently    Birth control/protection: Surgical  Lifestyle  . Physical activity    Days per week: Not on file    Minutes per session: Not on file  . Stress: Not on file  Relationships  . Social Herbalist on phone: Not on file    Gets together: Not on file    Attends religious service: Not on file    Active member of club or organization: Not on file    Attends meetings of clubs or organizations: Not on file    Relationship status: Not on file  . Intimate  partner violence    Fear of current or ex partner: Not on file    Emotionally abused: Not on file    Physically abused: Not on file    Forced sexual activity: Not on file  Other Topics Concern  . Not on file  Social History Narrative  . Not on file    FAMILY HISTORY:  Family History  Problem Relation Age of Onset  . Heart disease Mother   . Diabetes Mother   . Liver disease Father   . Stroke Brother     CURRENT MEDICATIONS:  Outpatient Encounter Medications as of 05/25/2019  Medication Sig  . amLODipine (NORVASC) 10 MG tablet Take 10 mg by mouth daily.  [name] anastrozole (ARIMIDEX) 1 MG tablet TAKE 1 TABLET BY MOUTH EVERY DAY  . aspirin EC 81 MG tablet Take 81 mg by mouth daily.  [name] atorvastatin (  LIPITOR) 10 MG tablet Take 10 mg by mouth daily at 6 PM.   . Calcium 250 MG CAPS Take 500 mg by mouth.  . cholecalciferol (VITAMIN D) 1000 units tablet Take 1,000 Units by mouth daily.  . diclofenac (VOLTAREN) 75 MG EC tablet TAKE 1 TABLET BY MOUTH 2 TIMES DAILY WITH A MEAL.  . fluticasone (FLONASE) 50 MCG/ACT nasal spray Place 2 sprays into both nostrils daily as needed for allergies or rhinitis.  [name] glyBURIDE-metformin (GLUCOVANCE) 5-500 MG tablet Take 1 tablet by mouth 2 (two) times daily.  . hydrochlorothiazide (HYDRODIURIL) 25 MG tablet Take 25 mg by mouth daily.  [name] lisinopril (PRINIVIL,ZESTRIL) 40 MG tablet Take 40 mg by mouth daily.  . pioglitazone (ACTOS) 15 MG tablet Take 15 mg by mouth daily.  . traMADol-acetaminophen (ULTRACET) 37.5-325 MG tablet Take 1 tablet by mouth every 4 (four) hours as needed.  . vitamin B-12 (CYANOCOBALAMIN) 500 MCG tablet Take 500 mcg by mouth daily.  . [DISCONTINUED] chlorhexidine (PERIDEX) 0.12 % solution 15 mLs 2 (two) times daily.  . [DISCONTINUED] glyBURIDE (DIABETA) 5 MG tablet   . [DISCONTINUED] ibuprofen (ADVIL,MOTRIN) 800 MG tablet    No facility-administered encounter medications on file as of 05/25/2019.     ALLERGIES:  No Known Allergies    PHYSICAL EXAM:  ECOG Performance status: 1  Vitals:   05/25/19 1408  BP: 135/77  Pulse: 89  Resp: 16  Temp: (!) 96.9 F (36.1 C)  SpO2: 97%   Filed Weights   05/25/19 1408  Weight: 198 lb 4.8 oz (89.9 kg)    Physical Exam Constitutional:      Appearance: Normal appearance.  HENT:     Head: Normocephalic.  Cardiovascular:     Rate and Rhythm: Normal rate and regular rhythm.     Pulses: Normal pulses.     Heart sounds: Normal heart sounds.  Pulmonary:     Effort: Pulmonary effort is normal.  Musculoskeletal:     Comments: Decreased range of motion  Skin:    General: Skin is warm.  Neurological:     General: No focal deficit present.     Mental Status: She is alert and oriented to person, place, and time.  Psychiatric:        Mood and Affect: Mood normal.      LABORATORY DATA:  I have reviewed the labs as listed.  CBC    Component Value Date/Time   WBC 5.5 05/22/2019 1142   RBC 3.87 05/22/2019 1142   HGB 11.4 (L) 05/22/2019 1142   HCT 34.9 (L) 05/22/2019 1142   PLT 232 05/22/2019 1142   MCV 90.2 05/22/2019 1142   MCH 29.5 05/22/2019 1142   MCHC 32.7 05/22/2019 1142   RDW 12.4 05/22/2019 1142   LYMPHSABS 1.5 05/22/2019 1142   MONOABS 0.4 05/22/2019 1142   EOSABS 0.1 05/22/2019 1142   BASOSABS 0.0 05/22/2019 1142   CMP Latest Ref Rng & Units 05/22/2019 09/27/2018 05/06/2018  Glucose 70 - 99 mg/dL 120(H) 182(H) 185(H)  BUN 8 - 23 mg/dL 34(H) 28(H) 24(H)  Creatinine 0.44 - 1.00 mg/dL 1.11(H) 1.25(H) 1.22(H)  Sodium 135 - 145 mmol/L 141 140 138  Potassium 3.5 - 5.1 mmol/L 3.7 3.4(L) 3.6  Chloride 98 - 111 mmol/L 103 105 104  CO2 22 - 32 mmol/L _0 Calcium 8.9 - 10.3 mg/dL 9.6 9.9 9.5  Total Protein 6.5 - 8.1 g/dL 7.1 7.6 7.2  Total Bilirubin 0.3 - 1.2 mg/dL 0.7 0.4 0.5  Alkaline Phos 38 - 126 U/L 110 128(H) 82  AST 15 - 41 U/L _0 ALT 0 - 44 U/L 25 34 28         ASSESSMENT & PLAN:   Breast cancer of upper-outer quadrant of  right female breast ([facility]) 1.  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  -Status post right lumpectomy, L&D on 12/30/2016 with PCR, YPT0, ypN0  - Anastrozole was started in August 2018.  -Bilateral mammogram on 09/27/2018 was BI-RADS Category 4 with recommended stereotactic biopsy of the superior aspect of the lumpectomy site of increasing distortion and density. -I reviewed biopsy report from 10/06/2018 of the right breast upper outer quadrant which shows benign breast parenchyma with  fibrosis and hemosiderin-containing macrophages.  Negative for carcinoma. -Physical examination today reveals thickness at the lumpectomy scar in the upper outer quadrant.  No palpable masses.  No palpable adenopathy. -Labs remain stable.  Plan to repeat mammogram in April 2021. -Patient will return to clinic in 6 months.  2.  Bone density: -DEXA scan on 05/20/2017 shows T score of -0.6 in the right femoral neck. -She will continue calcium and vitamin D twice daily.  She was counseled to do weightbearing exercises. -I plan to repeat DEXA scan prior to next visit in 6 months.  We will also check a vitamin D level.       Orders placed this encounter:  Orders Placed This Encounter  Procedures  . MM [facility] [phone]

## 2019-07-10 ENCOUNTER — Other Ambulatory Visit: Payer: Self-pay | Admitting: Orthopedic Surgery

## 2019-07-10 DIAGNOSIS — M1711 Unilateral primary osteoarthritis, right knee: Secondary | ICD-10-CM

## 2019-08-14 ENCOUNTER — Other Ambulatory Visit: Payer: Self-pay | Admitting: Orthopedic Surgery

## 2019-08-14 DIAGNOSIS — M25462 Effusion, left knee: Secondary | ICD-10-CM

## 2019-08-14 DIAGNOSIS — M25562 Pain in left knee: Secondary | ICD-10-CM

## 2019-08-14 DIAGNOSIS — M1711 Unilateral primary osteoarthritis, right knee: Secondary | ICD-10-CM

## 2019-09-27 ENCOUNTER — Other Ambulatory Visit (HOSPITAL_COMMUNITY): Payer: Self-pay | Admitting: Hematology

## 2019-09-27 DIAGNOSIS — Z170 Estrogen receptor positive status [ER+]: Secondary | ICD-10-CM

## 2019-09-27 DIAGNOSIS — C50411 Malignant neoplasm of upper-outer quadrant of right female breast: Secondary | ICD-10-CM

## 2019-10-10 ENCOUNTER — Encounter (HOSPITAL_COMMUNITY): Payer: 59

## 2019-10-10 ENCOUNTER — Ambulatory Visit (HOSPITAL_COMMUNITY)
Admission: RE | Admit: 2019-10-10 | Discharge: 2019-10-10 | Disposition: A | Discharge status: Home / Self Care | Payer: 59 | Source: Ambulatory Visit | Attending: Hematology | Admitting: Hematology

## 2019-10-10 ENCOUNTER — Other Ambulatory Visit: Payer: Self-pay

## 2019-10-10 DIAGNOSIS — Z170 Estrogen receptor positive status [ER+]: Secondary | ICD-10-CM | POA: Insufficient documentation

## 2019-10-10 DIAGNOSIS — C50411 Malignant neoplasm of upper-outer quadrant of right female breast: Secondary | ICD-10-CM | POA: Insufficient documentation

## 2019-10-26 ENCOUNTER — Other Ambulatory Visit (HOSPITAL_COMMUNITY): Payer: Self-pay | Admitting: Hematology

## 2019-11-04 ENCOUNTER — Other Ambulatory Visit: Payer: Self-pay | Admitting: Orthopedic Surgery

## 2019-11-04 DIAGNOSIS — M25562 Pain in left knee: Secondary | ICD-10-CM

## 2019-11-04 DIAGNOSIS — M1711 Unilateral primary osteoarthritis, right knee: Secondary | ICD-10-CM

## 2019-11-04 DIAGNOSIS — M25462 Effusion, left knee: Secondary | ICD-10-CM

## 2019-11-14 ENCOUNTER — Other Ambulatory Visit (HOSPITAL_COMMUNITY): Payer: Self-pay | Admitting: *Deleted

## 2019-11-14 DIAGNOSIS — Z170 Estrogen receptor positive status [ER+]: Secondary | ICD-10-CM

## 2019-11-15 ENCOUNTER — Inpatient Hospital Stay (HOSPITAL_COMMUNITY): Payer: 59 | Attending: Hematology

## 2019-11-15 ENCOUNTER — Other Ambulatory Visit: Payer: Self-pay

## 2019-11-15 DIAGNOSIS — Z7982 Long term (current) use of aspirin: Secondary | ICD-10-CM | POA: Diagnosis not present

## 2019-11-15 DIAGNOSIS — Z79811 Long term (current) use of aromatase inhibitors: Secondary | ICD-10-CM | POA: Insufficient documentation

## 2019-11-15 DIAGNOSIS — Z452 Encounter for adjustment and management of vascular access device: Secondary | ICD-10-CM | POA: Insufficient documentation

## 2019-11-15 DIAGNOSIS — Z90722 Acquired absence of ovaries, bilateral: Secondary | ICD-10-CM | POA: Diagnosis not present

## 2019-11-15 DIAGNOSIS — Z791 Long term (current) use of non-steroidal anti-inflammatories (NSAID): Secondary | ICD-10-CM | POA: Diagnosis not present

## 2019-11-15 DIAGNOSIS — Z823 Family history of stroke: Secondary | ICD-10-CM | POA: Diagnosis not present

## 2019-11-15 DIAGNOSIS — Z170 Estrogen receptor positive status [ER+]: Secondary | ICD-10-CM | POA: Insufficient documentation

## 2019-11-15 DIAGNOSIS — Z9079 Acquired absence of other genital organ(s): Secondary | ICD-10-CM | POA: Insufficient documentation

## 2019-11-15 DIAGNOSIS — Z833 Family history of diabetes mellitus: Secondary | ICD-10-CM | POA: Insufficient documentation

## 2019-11-15 DIAGNOSIS — I10 Essential (primary) hypertension: Secondary | ICD-10-CM | POA: Insufficient documentation

## 2019-11-15 DIAGNOSIS — Z79899 Other long term (current) drug therapy: Secondary | ICD-10-CM | POA: Insufficient documentation

## 2019-11-15 DIAGNOSIS — Z7984 Long term (current) use of oral hypoglycemic drugs: Secondary | ICD-10-CM | POA: Insufficient documentation

## 2019-11-15 DIAGNOSIS — Z8249 Family history of ischemic heart disease and other diseases of the circulatory system: Secondary | ICD-10-CM | POA: Insufficient documentation

## 2019-11-15 DIAGNOSIS — Z87891 Personal history of nicotine dependence: Secondary | ICD-10-CM | POA: Diagnosis not present

## 2019-11-15 DIAGNOSIS — Z90710 Acquired absence of both cervix and uterus: Secondary | ICD-10-CM | POA: Insufficient documentation

## 2019-11-15 DIAGNOSIS — E119 Type 2 diabetes mellitus without complications: Secondary | ICD-10-CM | POA: Insufficient documentation

## 2019-11-15 DIAGNOSIS — C50411 Malignant neoplasm of upper-outer quadrant of right female breast: Secondary | ICD-10-CM | POA: Insufficient documentation

## 2019-11-15 LAB — CBC WITH DIFFERENTIAL/PLATELET
Abs Immature Granulocytes: 0.02 10*3/uL (ref 0.00–0.07)
Basophils Absolute: 0 10*3/uL (ref 0.0–0.1)
Basophils Relative: 1 %
Eosinophils Absolute: 0.2 10*3/uL (ref 0.0–0.5)
Eosinophils Relative: 3 %
HCT: 36.2 % (ref 36.0–46.0)
Hemoglobin: 11.8 g/dL — ABNORMAL LOW (ref 12.0–15.0)
Immature Granulocytes: 0 %
Lymphocytes Relative: 27 %
Lymphs Abs: 1.6 10*3/uL (ref 0.7–4.0)
MCH: 29.1 pg (ref 26.0–34.0)
MCHC: 32.6 g/dL (ref 30.0–36.0)
MCV: 89.2 fL (ref 80.0–100.0)
Monocytes Absolute: 0.5 10*3/uL (ref 0.1–1.0)
Monocytes Relative: 9 %
Neutro Abs: 3.6 10*3/uL (ref 1.7–7.7)
Neutrophils Relative %: 60 %
Platelets: 105 10*3/uL — ABNORMAL LOW (ref 150–400)
RBC: 4.06 MIL/uL (ref 3.87–5.11)
RDW: 12.7 % (ref 11.5–15.5)
WBC: 5.9 10*3/uL (ref 4.0–10.5)
nRBC: 0 % (ref 0.0–0.2)

## 2019-11-15 LAB — COMPREHENSIVE METABOLIC PANEL
ALT: 28 U/L (ref 0–44)
AST: 23 U/L (ref 15–41)
Albumin: 3.9 g/dL (ref 3.5–5.0)
Alkaline Phosphatase: 108 U/L (ref 38–126)
Anion gap: 10 (ref 5–15)
BUN: 39 mg/dL — ABNORMAL HIGH (ref 8–23)
CO2: 24 mmol/L (ref 22–32)
Calcium: 9.5 mg/dL (ref 8.9–10.3)
Chloride: 104 mmol/L (ref 98–111)
Creatinine, Ser: 1.45 mg/dL — ABNORMAL HIGH (ref 0.44–1.00)
GFR calc Af Amer: 45 mL/min — ABNORMAL LOW (ref >60)
GFR calc non Af Amer: 39 mL/min — ABNORMAL LOW (ref >60)
Glucose, Bld: 157 mg/dL — ABNORMAL HIGH (ref 70–99)
Potassium: 3.4 mmol/L — ABNORMAL LOW (ref 3.5–5.1)
Sodium: 138 mmol/L (ref 135–145)
Total Bilirubin: 0.7 mg/dL (ref 0.3–1.2)
Total Protein: 7.2 g/dL (ref 6.5–8.1)

## 2019-11-15 LAB — VITAMIN D 25 HYDROXY (VIT D DEFICIENCY, FRACTURES): Vit D, 25-Hydroxy: 38.81 ng/mL (ref 30–100)

## 2019-11-22 ENCOUNTER — Other Ambulatory Visit: Payer: Self-pay

## 2019-11-22 ENCOUNTER — Inpatient Hospital Stay (HOSPITAL_COMMUNITY): Payer: 59

## 2019-11-22 ENCOUNTER — Inpatient Hospital Stay (HOSPITAL_BASED_OUTPATIENT_CLINIC_OR_DEPARTMENT_OTHER): Payer: 59 | Admitting: Nurse Practitioner

## 2019-11-22 DIAGNOSIS — C50411 Malignant neoplasm of upper-outer quadrant of right female breast: Secondary | ICD-10-CM

## 2019-11-22 DIAGNOSIS — Z452 Encounter for adjustment and management of vascular access device: Secondary | ICD-10-CM | POA: Diagnosis not present

## 2019-11-22 DIAGNOSIS — Z170 Estrogen receptor positive status [ER+]: Secondary | ICD-10-CM

## 2019-11-22 MED ORDER — HEPARIN SOD (PORK) LOCK FLUSH 100 UNIT/ML IV SOLN
500.0000 [IU] | Freq: Once | INTRAVENOUS | Status: AC
  Administered 2019-11-22: 500 [IU] via INTRAVENOUS

## 2019-11-22 MED ORDER — SODIUM CHLORIDE 0.9% FLUSH
10.0000 mL | INTRAVENOUS | Status: DC | PRN
  Administered 2019-11-22: 10 mL via INTRAVENOUS

## 2019-11-22 NOTE — Progress Notes (Signed)
[name] tolerated portacath flush well without complaints or incident. Port accessed with 20 gauge needle with blood return noted then flushed easily per protocol and de-accessed. Pt discharged self ambulatory in satisfactory condition

## 2019-11-22 NOTE — Patient Instructions (Signed)
[facility] at [facility] Discharge Instructions  Portacath flushed per protocol today. Follow-up as scheduled. Call clinic for any questions or concerns   Thank you for choosing [facility] at [facility] to provide your oncology and hematology care.  To afford each patient quality time with our provider, please arrive at least 15 minutes before your scheduled appointment time.   If you have a lab appointment with the Cancer Center please come in thru the Main Entrance and check in at the main information desk.  You need to re-schedule your appointment should you arrive 10 or more minutes late.  We strive to give you quality time with our providers, and arriving late affects you and other patients whose appointments are after yours.  Also, if you no show three or more times for appointments you may be dismissed from the clinic at the providers discretion.     Again, thank you for choosing [facility].  Our hope is that these requests will decrease the amount of time that you wait before being seen by our physicians.       _____________________________________________________________  Should you have questions after your visit to [facility], please contact our office at [phone] between the hours of 8:00 a.m. and 4:30 p.m.  Voicemails left after 4:00 p.m. will not be returned until the following business day.  For prescription refill requests, have your pharmacy contact our office and allow 72 hours.    Due to Covid, you will need to wear a mask upon entering the hospital. If you do not have a mask, a mask will be given to you at the Main Entrance upon arrival. For doctor visits, patients may have 1 support person with them. For treatment visits, patients can not have anyone with them due to social distancing guidelines and our immunocompromised population.     

## 2019-11-22 NOTE — Assessment & Plan Note (Signed)
1.  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 -Neoadjuvant AC x4 followed by Taxol x12 from 07/24/2016 through 12/04/2016. -Status post right lumpectomy, L&D on 12/30/2016 with PCR, YPT0, YPN0 -Anastrozole was started in August 2018 -She had a mammogram on 09/27/2018 that was BI-RADS Category 4 suspicious.  Recommended stereotactic biopsy of the superior aspect of the lumpectomy site of increasing distortion and density. -Biopsy on 10/06/2018 of the right breast upper outer quadrant which shows benign breast parenchyma with fibrosis and hemosidern containing macrophages.  Negative for carcinoma. -Physical assessment today reveals thickness of the lumpectomy scar in the upper outer quadrant.  No palpable masses no palpable adenopathy. -Labs done on 11/15/2019 showed potassium 3.4, creatinine 1.45, WBC 5.9, hemoglobin 11.8, platelets 105. -She will continue on anastrozole she is tolerating it very well. -She will follow-up in 6 months with repeat labs.  2.  Bone density: -DEXA scan on 05/20/2017 showed T score of -0.6 normal. -Last DEXA scan on 10/10/2019 showed a T score of -1.0 -She is continue to take her calcium twice a day and vitamin D once daily. -Labs done on 11/15/2019 showed Vitamin D was 38.81 and calcium 9.5

## 2019-11-22 NOTE — Patient Instructions (Signed)
[facility] at [facility] Discharge Instructions  Follow up in 6 months with labs    Thank you for choosing [facility] at [facility] to provide your oncology and hematology care.  To afford each patient quality time with our provider, please arrive at least 15 minutes before your scheduled appointment time.   If you have a lab appointment with the Cancer Center please come in thru the Main Entrance and check in at the main information desk.  You need to re-schedule your appointment should you arrive 10 or more minutes late.  We strive to give you quality time with our providers, and arriving late affects you and other patients whose appointments are after yours.  Also, if you no show three or more times for appointments you may be dismissed from the clinic at the providers discretion.     Again, thank you for choosing [facility].  Our hope is that these requests will decrease the amount of time that you wait before being seen by our physicians.       _____________________________________________________________  Should you have questions after your visit to [facility], please contact our office at [phone] between the hours of 8:00 a.m. and 4:30 p.m.  Voicemails left after 4:00 p.m. will not be returned until the following business day.  For prescription refill requests, have your pharmacy contact our office and allow 72 hours.    Due to Covid, you will need to wear a mask upon entering the hospital. If you do not have a mask, a mask will be given to you at the Main Entrance upon arrival. For doctor visits, patients may have 1 support person with them. For treatment visits, patients can not have anyone with them due to social distancing guidelines and our immunocompromised population.      

## 2019-11-22 NOTE — Progress Notes (Signed)
[place], [place] [address]   CLINIC:  Medical Oncology/Hematology  PCP:  Monico Blitz, [place] [address] [phone]   REASON FOR VISIT: Follow-up for breast cancer   CURRENT THERAPY: Anastrozole  BRIEF ONCOLOGIC HISTORY:  Oncology History  Breast cancer of upper-outer quadrant of right female breast ([facility])  06/24/2016 Mammogram   Diagnostic bilateral mammogram: highly suspicious 3.5 cm mass in the UO R breast with marked enlarged R axillary LN   06/24/2016 Breast US   2.7 x 3.1 x 3.5 cm slightly irregular hypoechoic mass at the 10:00 position of right breast, 6 cm from the nipple.  4.1 x 6 cm right axillar lymph node also identified.   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   07/21/2016 Imaging   CT CAP- 2.9 cm right upper outer quadrant breast mass, consistent with known primary breast carcinoma.  Right axillary and subpectoral lymphadenopathy, consistent with metastatic disease.  Tiny sub-cm bilateral pulmonary nodules are indeterminate, but favor postinflammatory etiology over early metastases. Recommend continued attention on follow-up CT.  No evidence of metastatic disease within the abdomen or pelvis.   07/23/2016 Procedure   Port placed by Dr. Barry Dienes   07/24/2016 - 12/04/2016 Neo-Adjuvant Chemotherapy   Adriamycin/Cytoxan x 4, followed by weekly Taxol x 12    07/30/2016 Imaging   Bone scan: Negative for osseous disease .   09/22/2016 Imaging   CT head- 1. Normal CT of the brain. No evidence of intracranial metastatic disease. 2. Frothy secretions throughout most of the paranasal sinuses. Correlate clinically for acute  sinusitis. This could serve as a source of headache.   12/30/2016 Surgery   Right lumpectomy with axillary lymph node dissection by Dr. Arnoldo Morale. Surgical path: 1. Breast, lumpectomy, right - FIBROCYSTIC AND FIBROADENOMATOID CHANGE. - USUAL DUCTAL HYPERPLASIA. - TREATMENT EFFECT. - NO RESIDUAL CARCINOMA IDENTIFIED. - SEE ONCOLOGY TABLE. 2. Lymph nodes, regional resection, right axillary contents - NINE OF NINE LYMPH NODES NEGATIVE FOR CARCINOMA (0/9). - TREATMENT EFFECT. Final stage ypT0 ypN0     CANCER STAGING: Cancer Staging Breast cancer of upper-outer quadrant of right female breast [facility]) Staging form: Breast, AJCC 8th Edition - Clinical stage from 08/07/2016: No Stage Recommended (ycT2, cN1(sn), cM0, G3, ER: Positive, PR: Negative, HER2: Negative) - Signed by Baird Cancer, PA-C on 08/07/2016    INTERVAL HISTORY:  [name] 64 y.o. female returns for routine follow-up for breast cancer.  Patient reports she is doing well since her last visit.  She denies any new lumps or bumps present.  She denies any new bone pain.  She is taking her anastrozole as prescribed with no side effects. Denies any nausea, vomiting, or diarrhea. Denies any new pains. Had not noticed any recent bleeding such as epistaxis, hematuria or hematochezia. Denies recent chest pain on exertion, shortness of breath on minimal exertion, pre-syncopal episodes, or palpitations. Denies any numbness or tingling in hands or feet. Denies any recent fevers, infections, or recent hospitalizations. Patient reports appetite at 100% and energy level at 50%.  She is eating well maintain her weight at this time.    REVIEW OF SYSTEMS:  Review of Systems  Respiratory:  Positive for shortness of breath.   All other systems reviewed and are negative.    PAST MEDICAL/SURGICAL HISTORY:  Past Medical History:  Diagnosis Date  . Arthritis   . Breast cancer of upper-outer quadrant of right female breast ([facility]) 07/14/2016  .  Cancer ([facility])   . Cough    dry  . Diabetes mellitus without complication ([facility])   . Hypertension   . Ruptured eardrum    left    Past Surgical History:  Procedure Laterality Date  . ABDOMINAL HYSTERECTOMY     with bilateral bso  . AXILLARY LYMPH NODE DISSECTION Right 12/30/2016   Procedure: AXILLARY LYMPH NODE DISSECTION;  Surgeon: Aviva Signs, MD;  Location: [facility];  Service: General;  Laterality: Right;  . COLONOSCOPY    . otif wrist Right   . PARTIAL MASTECTOMY WITH NEEDLE LOCALIZATION Right 12/30/2016   Procedure: PARTIAL MASTECTOMY WITH NEEDLE LOCALIZATION;  Surgeon: Aviva Signs, MD;  Location: [facility];  Service: General;  Laterality: Right;  . PORTACATH PLACEMENT Left 07/23/2016   Procedure: INSERTION PORT-A-CATH;  Surgeon: Stark Klein, MD;  Location: [facility];  Service: General;  Laterality: Left;  . RADIOLOGY WITH ANESTHESIA N/A 08/19/2017   Procedure: MRI WITH ANESTHESIA OF BRAIN WITHOUT CONTRAST;  Surgeon: Radiologist, Medication, MD;  Location: [facility];  Service: Radiology;  Laterality: N/A;  . ruptured ear drum Left      SOCIAL HISTORY:  Social History   Socioeconomic History  . Marital status: Single    Spouse name: Not on file  . Number of children: Not on file  . Years of education: Not on file  . Highest education level: Not on file  Occupational History  . Not on file  Tobacco Use  . Smoking status: Former Smoker    Packs/day: 0.50    Years: 4.00    Pack years: 2.00    Types: Cigarettes  . Smokeless tobacco: Never Used  Substance and Sexual Activity  . Alcohol use: No  . Drug use: No  . Sexual activity: Not Currently    Birth control/protection: Surgical  Other Topics Concern  . Not on file  Social History Narrative  . Not on file   Social Determinants of Health   Financial Resource Strain:   . Difficulty of Paying Living Expenses:   Food Insecurity:   . Worried About Charity fundraiser in the Last Year:   . Arboriculturist in  the Last Year:   Transportation Needs:   . Film/video editor (Medical):   [name] Lack of Transportation (Non-Medical):   Physical Activity:   . Days of Exercise per Week:   . Minutes of Exercise per Session:   Stress:   . Feeling of Stress :   Social Connections:   . Frequency of Communication with Friends and Family:   . Frequency of Social Gatherings with Friends and Family:   . Attends Religious Services:   . Active Member of Clubs or Organizations:   . Attends Archivist Meetings:   [name] Marital Status:   Intimate Partner Violence:   . Fear of Current or Ex-Partner:   . Emotionally Abused:   [name] Physically Abused:   . Sexually Abused:     FAMILY HISTORY:  Family History  Problem Relation Age of Onset  . Heart disease Mother   . Diabetes Mother   . Liver disease Father   . Stroke Brother     CURRENT MEDICATIONS:  Outpatient Encounter Medications  as of 11/22/2019  Medication Sig  . amLODipine (NORVASC) 10 MG tablet Take 10 mg by mouth daily.  [name] anastrozole (ARIMIDEX) 1 MG tablet TAKE 1 TABLET BY MOUTH EVERY DAY  . aspirin EC 81 MG tablet Take 81 mg by mouth daily.  [name] atorvastatin (LIPITOR) 10 MG tablet Take 10 mg by mouth daily at 6 PM.   . Calcium 250 MG CAPS Take 500 mg by mouth.  . cholecalciferol (VITAMIN D) 1000 units tablet Take 1,000 Units by mouth daily.  . diclofenac (VOLTAREN) 75 MG EC tablet TAKE 1 TABLET BY MOUTH 2 TIMES DAILY WITH A MEAL.  . hydrochlorothiazide (HYDRODIURIL) 25 MG tablet Take 25 mg by mouth daily.  [name] lisinopril (PRINIVIL,ZESTRIL) 40 MG tablet Take 40 mg by mouth daily.  . pioglitazone (ACTOS) 15 MG tablet Take 15 mg by mouth daily.  . vitamin B-12 (CYANOCOBALAMIN) 500 MCG tablet Take 500 mcg by mouth daily.  . [DISCONTINUED] fluticasone (FLONASE) 50 MCG/ACT nasal spray Place 2 sprays into both nostrils daily as needed for allergies or rhinitis.  . [DISCONTINUED] glyBURIDE-metformin (GLUCOVANCE) 5-500 MG tablet Take 1 tablet by mouth 2  (two) times daily.  . [DISCONTINUED] traMADol-acetaminophen (ULTRACET) 37.5-325 MG tablet TAKE 1 TABLET BY MOUTH EVERY 4 (FOUR) HOURS AS NEEDED.   No facility-administered encounter medications on file as of 11/22/2019.    ALLERGIES:  No Known Allergies   PHYSICAL EXAM:  ECOG Performance status: 1  Vitals:   11/22/19 1359  BP: 135/73  Pulse: 73  Resp: 18  Temp: (!) 96.9 F (36.1 C)  SpO2: 100%   Filed Weights   11/22/19 1359  Weight: 206 lb (93.4 kg)   Physical Exam Constitutional:      Appearance: Normal appearance. She is normal weight.  Cardiovascular:     Rate and Rhythm: Normal rate and regular rhythm.     Heart sounds: Normal heart sounds.  Pulmonary:     Effort: Pulmonary effort is normal.     Breath sounds: Normal breath sounds.  Abdominal:     General: Bowel sounds are normal.     Palpations: Abdomen is soft.  Musculoskeletal:        General: Normal range of motion.  Skin:    General: Skin is warm.  Neurological:     Mental Status: She is alert and oriented to person, place, and time. Mental status is at baseline.  Psychiatric:        Mood and Affect: Mood normal.        Behavior: Behavior normal.        Thought Content: Thought content normal.        Judgment: Judgment normal.      LABORATORY DATA:  I have reviewed the labs as listed.  CBC    Component Value Date/Time   WBC 5.9 11/15/2019 1205   RBC 4.06 11/15/2019 1205   HGB 11.8 (L) 11/15/2019 1205   HCT 36.2 11/15/2019 1205   PLT 105 (L) 11/15/2019 1205   MCV 89.2 11/15/2019 1205   MCH 29.1 11/15/2019 1205   MCHC 32.6 11/15/2019 1205   RDW 12.7 11/15/2019 1205   LYMPHSABS 1.6 11/15/2019 1205   MONOABS 0.5 11/15/2019 1205   EOSABS 0.2 11/15/2019 1205   BASOSABS 0.0 11/15/2019 1205   CMP Latest Ref Rng & Units 11/15/2019 05/22/2019 09/27/2018  Glucose 70 - 99 mg/dL 157(H) 120(H) 182(H)  BUN 8 - 23 mg/dL 39(H) 34(H) 28(H)  Creatinine 0.44 - 1.00 mg/dL 1.45(H) 1.11(H) 1.25(H)  Sodium  135 - 145 mmol/L 138 141 140  Potassium 3.5 - 5.1 mmol/L 3.4(L) 3.7 3.4(L)  Chloride 98 - 111 mmol/L 104 103 105  CO2 22 - 32 mmol/L _0 Calcium 8.9 - 10.3 mg/dL 9.5 9.6 9.9  Total Protein 6.5 - 8.1 g/dL 7.2 7.1 7.6  Total Bilirubin 0.3 - 1.2 mg/dL 0.7 0.7 0.4  Alkaline Phos 38 - 126 U/L 108 110 128(H)  AST 15 - 41 U/L _1 ALT 0 - 44 U/L 28 25 34    DIAGNOSTIC IMAGING:  I have independently reviewed the mammogram and bone density scans and discussed with the patient.  ASSESSMENT & PLAN:  Breast cancer of upper-outer quadrant of right female breast ([facility]) 1.  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 -Neoadjuvant AC x4 followed by Taxol x12 from 07/24/2016 through 12/04/2016. -Status post right lumpectomy, L&D on 12/30/2016 with PCR, YPT0, YPN0 -Anastrozole was started in August 2018 -She had a mammogram on 09/27/2018 that was BI-RADS Category 4 suspicious.  Recommended stereotactic biopsy of the superior aspect of the lumpectomy site of increasing distortion and density. -Biopsy on 10/06/2018 of the right breast upper outer quadrant which shows benign breast parenchyma with fibrosis and hemosidern containing macrophages.  Negative for carcinoma. -Physical assessment today reveals thickness of the lumpectomy scar in the upper outer quadrant.  No palpable masses no palpable adenopathy. -Labs done on 11/15/2019 showed potassium 3.4, creatinine 1.45, WBC 5.9, hemoglobin 11.8, platelets 105. -She will continue on anastrozole she is tolerating it very well. -She will follow-up in 6 months with repeat labs.  2.  Bone density: -DEXA scan on 05/20/2017 showed T score of -0.6 normal. -Last DEXA scan on 10/10/2019 showed a T score of -1.0 -She is continue to take her calcium twice a day and vitamin D once daily. -Labs done on 11/15/2019 showed Vitamin D was 38.81 and calcium 9.5     Orders placed  this encounter:  Orders Placed This Encounter  Procedures  . Lactate dehydrogenase  . CBC with Differential/Platelet  . Comprehensive metabolic panel  . Vitamin B12  . VITAMIN D 25 Hydroxy (Vit-D Deficiency, Fractures)  . Folate      Francene Finders, FNP-C [place] [phone]

## 2019-12-26 NOTE — Patient Instructions (Signed)
[name]  12/26/2019     @PREFPERIOPPHARMACY @   Your procedure is scheduled on  01/03/2020.  Report to [facility] at  [phone]  A.M.  Call this number if you have problems the morning of surgery:  [phone]   Remember:  Do not eat or drink after midnight.                       Take these medicines the morning of surgery with A SIP OF WATER  Amlodipine, armidex, voltaren.    Do not wear jewelry, make-up or nail polish.  Do not wear lotions, powders, or perfumes. Please wear deodorant and brush your teeth.  Do not shave 48 hours prior to surgery.  Men may shave face and neck.  Do not bring valuables to the hospital.  [facility] is not responsible for any belongings or valuables.  Contacts, dentures or bridgework may not be worn into surgery.  Leave your suitcase in the car.  After surgery it may be brought to your room.  For patients admitted to the hospital, discharge time will be determined by your treatment team.  Patients discharged the day of surgery will not be allowed to drive home.   Name and phone number of your driver:   family Special instructions:  DO NOT smoke the morning of your procedure.  Please read over the following fact sheets that you were given. Anesthesia Post-op Instructions and Care and Recovery After Surgery       Cataract Surgery, Care After This sheet gives you information about how to care for yourself after your procedure. Your health care provider may also give you more specific instructions. If you have problems or questions, contact your health care provider. What can I expect after the procedure? After the procedure, it is common to have:  Itching.  Discomfort.  Fluid discharge.  Sensitivity to light and to touch.  Bruising in or around the eye.  Mild blurred vision. Follow these instructions at home: Eye care   Do not touch or rub your eyes.  Protect your eyes as told by your health care provider. You may be  told to wear a protective eye shield or sunglasses.  Do not put a contact lens into the affected eye or eyes until your health care provider approves.  Keep the area around your eye clean and dry: ? Avoid swimming. ? Do not allow water to hit you directly in the face while showering. ? Keep soap and shampoo out of your eyes.  Check your eye every day for signs of infection. Watch for: ? Redness, swelling, or pain. ? Fluid, blood, or pus. ? Warmth. ? A bad smell. ? Vision that is getting worse. ? Sensitivity that is getting worse. Activity  Do not drive for 24 hours if you were given a sedative during your procedure.  Avoid strenuous activities, such as playing contact sports, for as long as told by your health care provider.  Do not drive or use heavy machinery until your health care provider approves.  Do not bend or lift heavy objects. Bending increases pressure in the eye. You can walk, climb stairs, and do light household chores.  Ask your health care provider when you can return to work. If you work in a dusty environment, you may be advised to wear protective eyewear for a period of time. General instructions  Take or apply over-the-counter and prescription medicines only as  told by your health care provider. This includes eye drops.  Keep all follow-up visits as told by your health care provider. This is important. Contact a health care provider if:  You have increased bruising around your eye.  You have pain that is not helped with medicine.  You have a fever.  You have redness, swelling, or pain in your eye.  You have fluid, blood, or pus coming from your incision.  Your vision gets worse.  Your sensitivity to light gets worse. Get help right away if:  You have sudden loss of vision.  You see flashes of light or spots (floaters).  You have severe eye pain.  You develop nausea or vomiting. Summary  After your procedure, it is common to have itching,  discomfort, bruising, fluid discharge, or sensitivity to light.  Follow instructions from your health care provider about caring for your eye after the procedure.  Do not rub your eye after the procedure. You may need to wear eye protection or sunglasses. Do not wear contact lenses. Keep the area around your eye clean and dry.  Avoid activities that require a lot of effort. These include playing sports and lifting heavy objects.  Contact a health care provider if you have increased bruising, pain that does not go away, or a fever. Get help right away if you suddenly lose your vision, see flashes of light or spots, or have severe pain in the eye. This information is not intended to replace advice given to you by your health care provider. Make sure you discuss any questions you have with your health care provider. Document Revised: 04/18/2019 Document Reviewed: 12/20/2017 Elsevier Patient Education  2020 [place] After These instructions provide you with information about caring for yourself after your procedure. Your health care provider may also give you more specific instructions. Your treatment has been planned according to current medical practices, but problems sometimes occur. Call your health care provider if you have any problems or questions after your procedure. What can I expect after the procedure? After your procedure, you may:  Feel sleepy for several hours.  Feel clumsy and have poor balance for several hours.  Feel forgetful about what happened after the procedure.  Have poor judgment for several hours.  Feel nauseous or vomit.  Have a sore throat if you had a breathing tube during the procedure. Follow these instructions at home: For at least 24 hours after the procedure:      Have a responsible adult stay with you. It is important to have someone help care for you until you are awake and alert.  Rest as needed.  Do  not: ? Participate in activities in which you could fall or become injured. ? Drive. ? Use heavy machinery. ? Drink alcohol. ? Take sleeping pills or medicines that cause drowsiness. ? Make important decisions or sign legal documents. ? Take care of children on your own. Eating and drinking  Follow the diet that is recommended by your health care provider.  If you vomit, drink water, juice, or soup when you can drink without vomiting.  Make sure you have little or no nausea before eating solid foods. General instructions  Take over-the-counter and prescription medicines only as told by your health care provider.  If you have sleep apnea, surgery and certain medicines can increase your risk for breathing problems. Follow instructions from your health care provider about wearing your sleep device: ? Anytime you are sleeping, including  during daytime naps. ? While taking prescription pain medicines, sleeping medicines, or medicines that make you drowsy.  If you smoke, do not smoke without supervision.  Keep all follow-up visits as told by your health care provider. This is important. Contact a health care provider if:  You keep feeling nauseous or you keep vomiting.  You feel light-headed.  You develop a rash.  You have a fever. Get help right away if:  You have trouble breathing. Summary  For several hours after your procedure, you may feel sleepy and have poor judgment.  Have a responsible adult stay with you for at least 24 hours or until you are awake and alert. This information is not intended to replace advice given to you by your health care provider. Make sure you discuss any questions you have with your health care provider. Document Revised: 09/20/2017 Document Reviewed: 10/13/2015 Elsevier Patient Education  [place].

## 2019-12-26 NOTE — H&P (Signed)
Surgical History & Physical  Patient Name: [name] DOB: 07-28-1955  Surgery: Cataract extraction with intraocular lens implant phacoemulsification; Left Eye  Surgeon: Baruch Goldmann MD Surgery Date:  01/03/2020 Pre-Op Date:  12/26/2019  HPI: A 13 Yr. old female patient 1. 1. The patient is here for a cataract evaluation of the left eye. PT reports blurred vision OS. She states there has been a steady decrease in [place] OS. She reports hazy and blurred vision. She reports poor night vision due to halos and glare. Pt also states her distance and reading vision are both compromised. This is negatively affecting the patient's quality of life. No eye pain. Pt is DM II. BS this am was 180. Last A1C was in the 7's. No flashes or floaters. HPI was performed by Baruch Goldmann .  Medical History: Cataracts Cancer Diabetes High Blood Pressure LDL  Review of Systems Cardiovascular High Blood Pressure All recorded systems are negative except as noted above.  Social   Never smoked   Medication Amlodipine Besylate, Anastrozole, Atorvastatin, Diclofenac Sodium, Glyburide-Metformin, Hydrochlorothiazide, Lisinopril, Tramadol hydrochloride, Vitamin D2, Aspirin,   Sx/Procedures  None  Drug Allergies   NKDA  History & Physical: Heent:  Cataract, Left eye NECK: supple without bruits LUNGS: lungs clear to auscultation CV: regular rate and rhythm Abdomen: soft and non-tender  Impression & Plan: Assessment: 1.  COMBINED FORMS AGE RELATED CATARACT; Left Eye (H25.812) 2.  Diabetes Type 2 No retinopathy (E11.9)  Plan: 1.  Cataract accounts for the patient's decreased vision. This visual impairment is not correctable with a tolerable change in glasses or contact lenses. Cataract surgery with an implantation of a new lens should significantly improve the visual and functional status of the patient. Discussed all risks, benefits, alternatives, and potential complications. Discussed the procedures  and recovery. Patient desires to have surgery. A-scan ordered and performed today for intra-ocular lens calculations. The surgery will be performed in order to improve vision for driving, reading, and for eye examinations. Recommend phacoemulsification with intra-ocular lens. Left Eye only. Dilates well - shugarcaine by protocol. 2.  Stressed importance of blood sugar and blood pressure control, and also yearly eye examinations. Discussed the need for ongoing proactive ocular exams and treatment, hopefully before visual symptoms develop.

## 2019-12-30 IMAGING — MG STEREOTACTIC VACUUM ASSIST RIGHT
7 of 15 series · 7 of 23 positions shown · non-contrast
Comparison: Previous exams.
COMPARISON: Previous exams.

Addendum:
CLINICAL DATA: History of RIGHT breast cancer in 2459 status post
lumpectomy.

[R (1 of 7)]
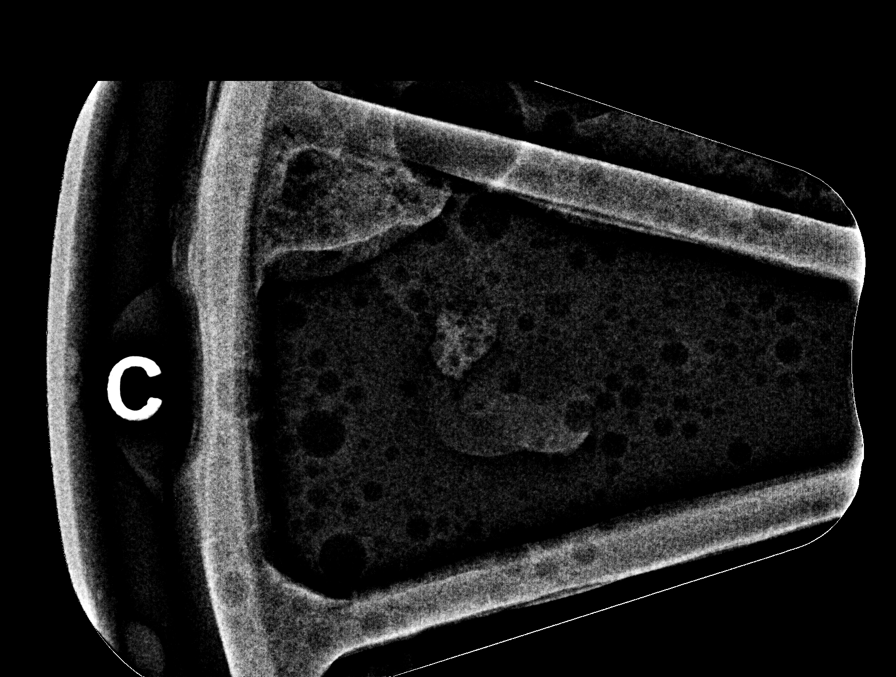

[R (2 of 7)]
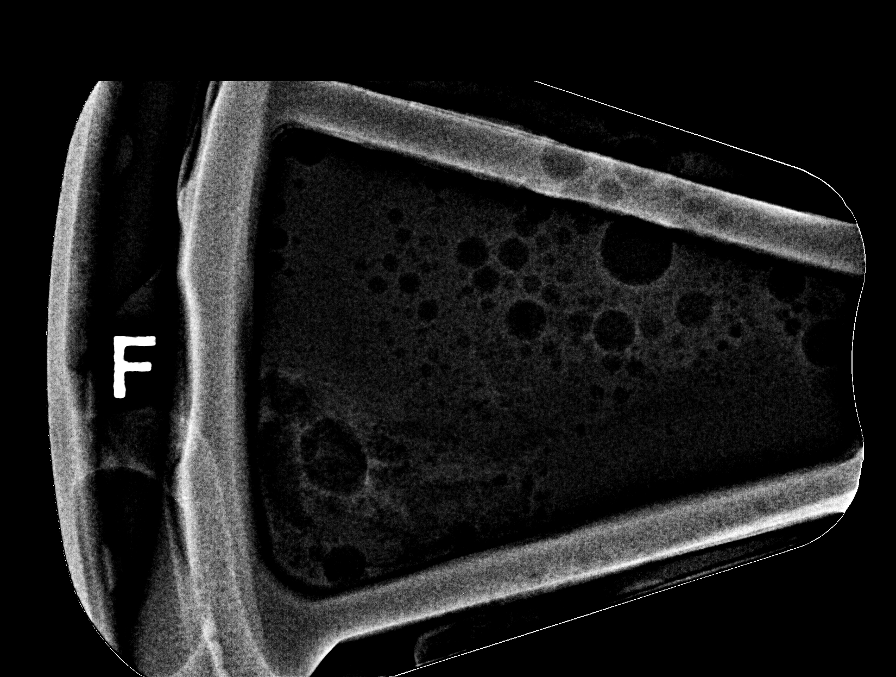

[R (3 of 7)]
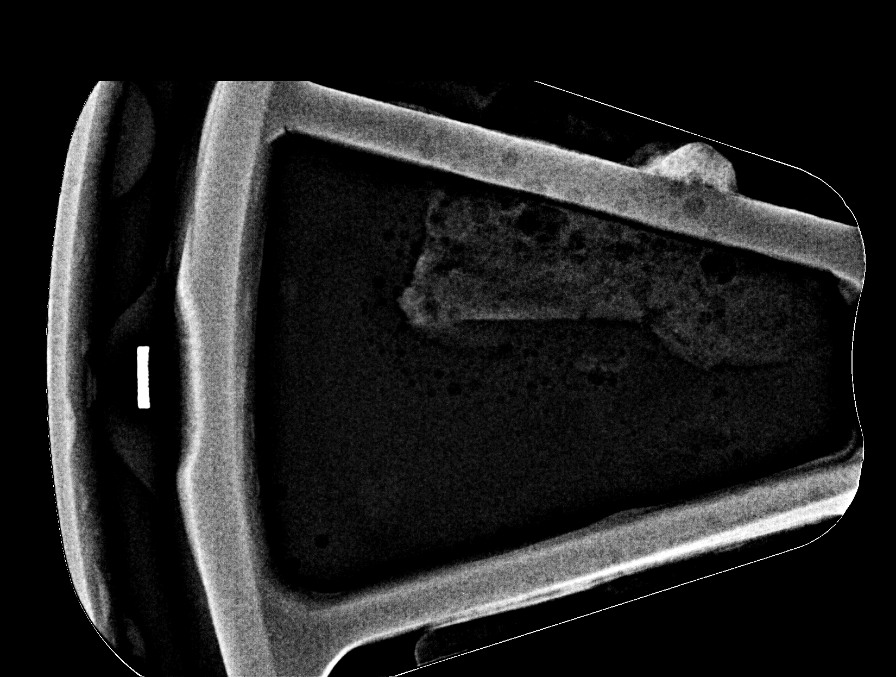

[R (4 of 7)]
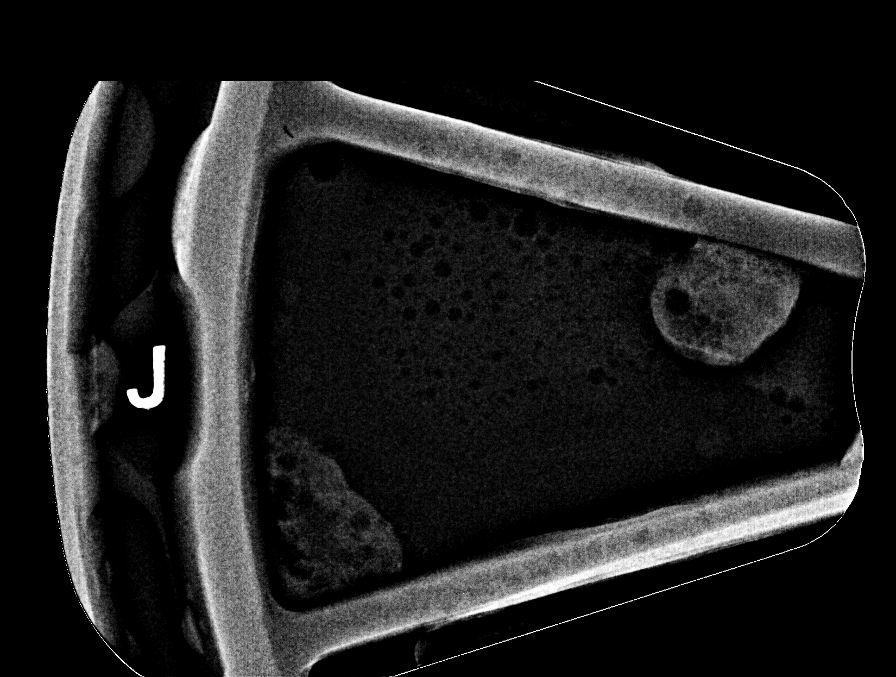

[R (5 of 7)]
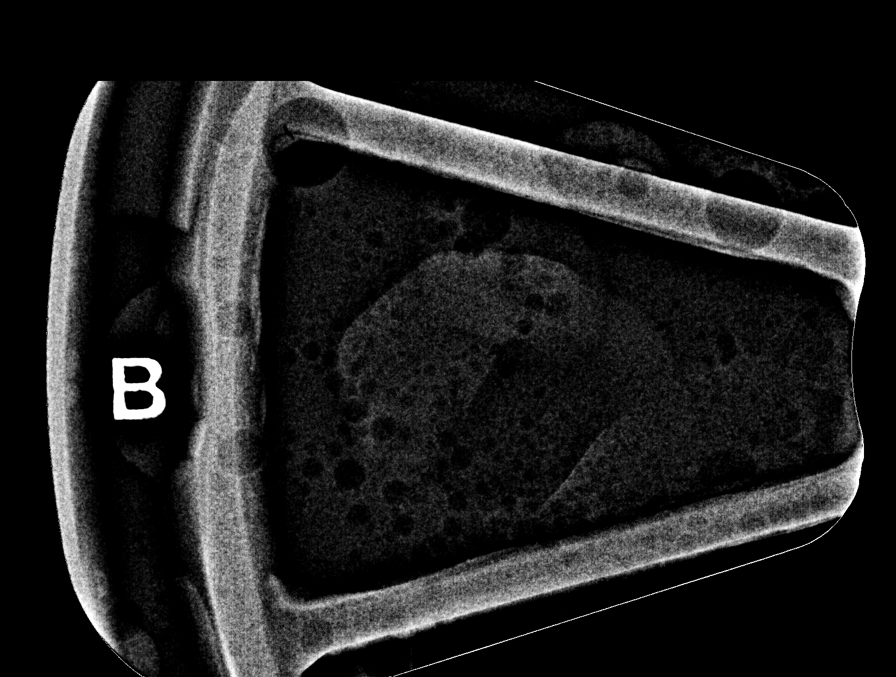

[R (6 of 7)]
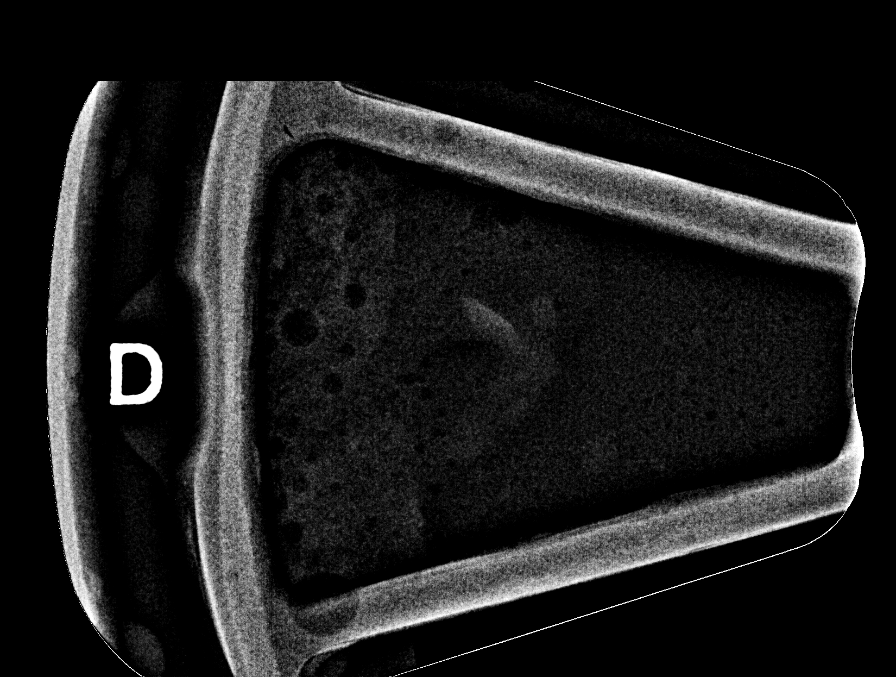

[R (7 of 7)]
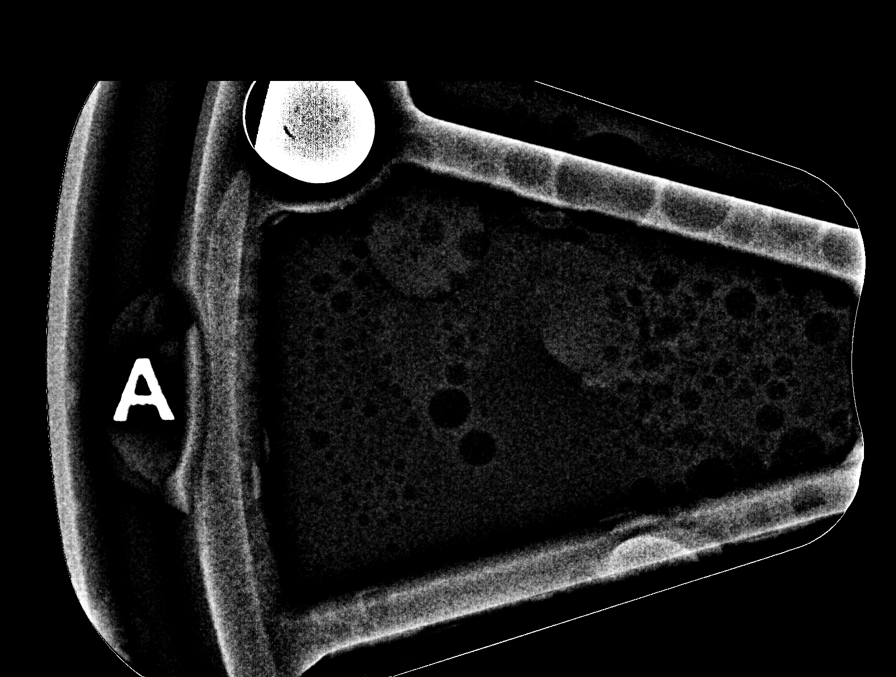

[7 of 23 positions shown; findings below may reference images not displayed]

Diagnostic mammogram report of 09/27/2018 described increasing
distortion at the RIGHT lumpectomy site, more prominent than
typically seen at 1 year post lumpectomy, and with increased density
along the superior aspect of the lumpectomy site. Stereotactic
biopsy of the distortion/density was recommended.

EXAM:
RIGHT BREAST STEREOTACTIC CORE NEEDLE BIOPSY
FINDINGS: The patient and I discussed the procedure of stereotactic-guided
biopsy including benefits and alternatives. We discussed the high
likelihood of a successful procedure. We discussed the risks of the
procedure including infection, bleeding, tissue injury, clip
migration, and inadequate sampling. Informed written consent was
given. The usual time out protocol was performed immediately prior
to the procedure.

Using sterile technique and 1% Lidocaine as local anesthetic, under
stereotactic guidance, a 9 gauge vacuum assisted device was used to
perform core needle biopsy of distortion/density in the upper outer
quadrant of the RIGHT breast, at lumpectomy site, using a lateral
approach.

Lesion quadrant: Upper outer quadrant

At the conclusion of the procedure, a coil shaped tissue marker clip
was deployed into the biopsy cavity. Follow-up 2-view mammogram was
performed and dictated separately.
IMPRESSION: Stereotactic-guided biopsy of distortion/density at site of previous
lumpectomy within the upper-outer quadrant of the RIGHT breast. No
apparent complications.

ADDENDUM:
Pathology revealed BENIGN BREAST PARENCHYMA WITH HYALINE FIBROSIS
(SCAR) AND HEMOSIDERIN-CONTAINING MACROPHAGES of the RIGHT breast,
upper outer quadrant. On 10/07/2026 at 11 a.m., Dr. Baardt
discussed the pathology results by phone with Dr. Stringer who
explained that the visualized hyaline fibrosis and hemosiderin is
compatible with benign postsurgical change. As such, the biopsy
result was found to be concordant by Dr. Olger Pg.

Pathology results were discussed with the patient by telephone. The
patient reported doing well after the biopsy with tenderness at the
site. Post biopsy instructions and care were reviewed and questions
were answered. The patient was encouraged to call The [REDACTED]

The patient was instructed to return for annual diagnostic
mammography and informed a reminder notice would be sent regarding
this appointment.

Pathology results reported by Kiri Jim, RN on 10/07/2018.

*** End of Addendum ***
Diagnostic mammogram report of 09/27/2018 described increasing
distortion at the RIGHT lumpectomy site, more prominent than
typically seen at 1 year post lumpectomy, and with increased density
along the superior aspect of the lumpectomy site. Stereotactic
biopsy of the distortion/density was recommended.

EXAM:
RIGHT BREAST STEREOTACTIC CORE NEEDLE BIOPSY
FINDINGS: The patient and I discussed the procedure of stereotactic-guided
biopsy including benefits and alternatives. We discussed the high
likelihood of a successful procedure. We discussed the risks of the
procedure including infection, bleeding, tissue injury, clip
migration, and inadequate sampling. Informed written consent was
given. The usual time out protocol was performed immediately prior
to the procedure.

Using sterile technique and 1% Lidocaine as local anesthetic, under
stereotactic guidance, a 9 gauge vacuum assisted device was used to
perform core needle biopsy of distortion/density in the upper outer
quadrant of the RIGHT breast, at lumpectomy site, using a lateral
approach.

Lesion quadrant: Upper outer quadrant

At the conclusion of the procedure, a coil shaped tissue marker clip
was deployed into the biopsy cavity. Follow-up 2-view mammogram was
performed and dictated separately.
IMPRESSION: Stereotactic-guided biopsy of distortion/density at site of previous
lumpectomy within the upper-outer quadrant of the RIGHT breast. No
apparent complications.

## 2020-01-01 ENCOUNTER — Other Ambulatory Visit (HOSPITAL_COMMUNITY)
Admission: RE | Admit: 2020-01-01 | Discharge: 2020-01-01 | Disposition: A | Discharge status: Home / Self Care | Payer: 59 | Source: Ambulatory Visit | Attending: Ophthalmology | Admitting: Ophthalmology

## 2020-01-01 ENCOUNTER — Other Ambulatory Visit: Payer: Self-pay

## 2020-01-01 ENCOUNTER — Encounter (HOSPITAL_COMMUNITY)
Admission: RE | Admit: 2020-01-01 | Discharge: 2020-01-01 | Disposition: A | Discharge status: Home / Self Care | Payer: 59 | Source: Ambulatory Visit | Attending: Ophthalmology | Admitting: Ophthalmology

## 2020-01-01 DIAGNOSIS — Z01812 Encounter for preprocedural laboratory examination: Secondary | ICD-10-CM | POA: Insufficient documentation

## 2020-01-01 DIAGNOSIS — Z20822 Contact with and (suspected) exposure to covid-19: Secondary | ICD-10-CM | POA: Diagnosis not present

## 2020-01-01 LAB — BASIC METABOLIC PANEL
Anion gap: 12 (ref 5–15)
BUN: 35 mg/dL — ABNORMAL HIGH (ref 8–23)
CO2: 23 mmol/L (ref 22–32)
Calcium: 9.6 mg/dL (ref 8.9–10.3)
Chloride: 104 mmol/L (ref 98–111)
Creatinine, Ser: 1.4 mg/dL — ABNORMAL HIGH (ref 0.44–1.00)
GFR calc Af Amer: 47 mL/min — ABNORMAL LOW (ref >60)
GFR calc non Af Amer: 40 mL/min — ABNORMAL LOW (ref >60)
Glucose, Bld: 179 mg/dL — ABNORMAL HIGH (ref 70–99)
Potassium: 3.7 mmol/L (ref 3.5–5.1)
Sodium: 139 mmol/L (ref 135–145)

## 2020-01-01 LAB — HEMOGLOBIN A1C
Hgb A1c MFr Bld: 8.4 % — ABNORMAL HIGH (ref 4.8–5.6)
Mean Plasma Glucose: 194.38 mg/dL

## 2020-01-03 ENCOUNTER — Other Ambulatory Visit: Payer: Self-pay

## 2020-01-03 ENCOUNTER — Ambulatory Visit (HOSPITAL_COMMUNITY)
Admission: RE | Admit: 2020-01-03 | Discharge: 2020-01-03 | Disposition: A | Discharge status: Home / Self Care | Payer: 59 | Attending: Ophthalmology | Admitting: Ophthalmology

## 2020-01-03 ENCOUNTER — Encounter (HOSPITAL_COMMUNITY): Payer: Self-pay | Admitting: Ophthalmology

## 2020-01-03 ENCOUNTER — Ambulatory Visit (HOSPITAL_COMMUNITY): Payer: 59 | Admitting: Anesthesiology

## 2020-01-03 ENCOUNTER — Encounter (HOSPITAL_COMMUNITY)
Admission: RE | Disposition: A | Discharge status: Home / Self Care | Payer: Self-pay | Source: Home / Self Care | Attending: Ophthalmology

## 2020-01-03 DIAGNOSIS — I10 Essential (primary) hypertension: Secondary | ICD-10-CM | POA: Insufficient documentation

## 2020-01-03 DIAGNOSIS — H25812 Combined forms of age-related cataract, left eye: Secondary | ICD-10-CM | POA: Insufficient documentation

## 2020-01-03 DIAGNOSIS — E1136 Type 2 diabetes mellitus with diabetic cataract: Secondary | ICD-10-CM | POA: Diagnosis not present

## 2020-01-03 DIAGNOSIS — Z7982 Long term (current) use of aspirin: Secondary | ICD-10-CM | POA: Diagnosis not present

## 2020-01-03 DIAGNOSIS — Z9221 Personal history of antineoplastic chemotherapy: Secondary | ICD-10-CM | POA: Insufficient documentation

## 2020-01-03 DIAGNOSIS — Z87891 Personal history of nicotine dependence: Secondary | ICD-10-CM | POA: Insufficient documentation

## 2020-01-03 DIAGNOSIS — Z79899 Other long term (current) drug therapy: Secondary | ICD-10-CM | POA: Insufficient documentation

## 2020-01-03 DIAGNOSIS — Z7984 Long term (current) use of oral hypoglycemic drugs: Secondary | ICD-10-CM | POA: Diagnosis not present

## 2020-01-03 DIAGNOSIS — M1990 Unspecified osteoarthritis, unspecified site: Secondary | ICD-10-CM | POA: Insufficient documentation

## 2020-01-03 HISTORY — PX: CATARACT EXTRACTION W/PHACO: SHX586

## 2020-01-03 LAB — GLUCOSE, CAPILLARY: Glucose-Capillary: 194 mg/dL — ABNORMAL HIGH (ref 70–99)

## 2020-01-03 SURGERY — PHACOEMULSIFICATION, CATARACT, WITH IOL INSERTION
Anesthesia: General | Site: Eye | Laterality: Left

## 2020-01-03 MED ORDER — LIDOCAINE HCL (PF) 1 % IJ SOLN
INTRAOCULAR | Status: DC | PRN
  Administered 2020-01-03: 1 mL via OPHTHALMIC

## 2020-01-03 MED ORDER — POVIDONE-IODINE 5 % OP SOLN
OPHTHALMIC | Status: DC | PRN
  Administered 2020-01-03: 1 via OPHTHALMIC

## 2020-01-03 MED ORDER — BSS IO SOLN
INTRAOCULAR | Status: DC | PRN
  Administered 2020-01-03: 15 mL via INTRAOCULAR

## 2020-01-03 MED ORDER — EPINEPHRINE PF 1 MG/ML IJ SOLN
INTRAOCULAR | Status: DC | PRN
  Administered 2020-01-03: 500 mL

## 2020-01-03 MED ORDER — MIDAZOLAM HCL 2 MG/2ML IJ SOLN
INTRAMUSCULAR | Status: AC
  Filled 2020-01-03: qty 2

## 2020-01-03 MED ORDER — LIDOCAINE HCL 3.5 % OP GEL
1.0000 "application " | Freq: Once | OPHTHALMIC | Status: AC
  Administered 2020-01-03: 1 via OPHTHALMIC

## 2020-01-03 MED ORDER — SODIUM CHLORIDE 0.9% FLUSH
10.0000 mL | INTRAVENOUS | Status: DC | PRN
  Administered 2020-01-03: 6 mL via INTRAVENOUS

## 2020-01-03 MED ORDER — NEOMYCIN-POLYMYXIN-DEXAMETH 3.5-10000-0.1 OP SUSP
OPHTHALMIC | Status: DC | PRN
  Administered 2020-01-03: 1 [drp] via OPHTHALMIC

## 2020-01-03 MED ORDER — CYCLOPENTOLATE-PHENYLEPHRINE 0.2-1 % OP SOLN
1.0000 [drp] | OPHTHALMIC | Status: AC | PRN
  Administered 2020-01-03 (×3): 1 [drp] via OPHTHALMIC

## 2020-01-03 MED ORDER — PHENYLEPHRINE HCL 2.5 % OP SOLN
1.0000 [drp] | OPHTHALMIC | Status: AC | PRN
  Administered 2020-01-03 (×3): 1 [drp] via OPHTHALMIC

## 2020-01-03 MED ORDER — PROVISC 10 MG/ML IO SOLN
INTRAOCULAR | Status: DC | PRN
  Administered 2020-01-03: 0.85 mL via INTRAOCULAR

## 2020-01-03 MED ORDER — MIDAZOLAM HCL 2 MG/2ML IJ SOLN
INTRAMUSCULAR | Status: DC | PRN
  Administered 2020-01-03: 2 mg via INTRAVENOUS

## 2020-01-03 MED ORDER — LACTATED RINGERS IV SOLN: INTRAVENOUS | Status: DC

## 2020-01-03 MED ORDER — TETRACAINE HCL 0.5 % OP SOLN
1.0000 [drp] | OPHTHALMIC | Status: AC | PRN
  Administered 2020-01-03 (×3): 1 [drp] via OPHTHALMIC

## 2020-01-03 MED ORDER — SODIUM HYALURONATE 23 MG/ML IO SOLN
INTRAOCULAR | Status: DC | PRN
  Administered 2020-01-03: 0.6 mL via INTRAOCULAR

## 2020-01-03 SURGICAL SUPPLY — 13 items
CLOTH BEACON ORANGE TIMEOUT ST (SAFETY) ×2 IMPLANT
EYE SHIELD UNIVERSAL CLEAR (GAUZE/BANDAGES/DRESSINGS) ×2 IMPLANT
GLOVE BIOGEL PI IND STRL 7.0 (GLOVE) IMPLANT
GLOVE BIOGEL PI INDICATOR 7.0 (GLOVE) ×4
LENS ALC ACRYL/TECN (Ophthalmic Related) ×2 IMPLANT
NDL HYPO 18GX1.5 BLUNT FILL (NEEDLE) IMPLANT
NEEDLE HYPO 18GX1.5 BLUNT FILL (NEEDLE) ×3 IMPLANT
PAD ARMBOARD 7.5X6 YLW CONV (MISCELLANEOUS) ×2 IMPLANT
SYR TB 1ML LL NO SAFETY (SYRINGE) ×2 IMPLANT
TAPE SURG TRANSPORE 1 IN (GAUZE/BANDAGES/DRESSINGS) IMPLANT
TAPE SURGICAL TRANSPORE 1 IN (GAUZE/BANDAGES/DRESSINGS) ×3
VISCOELASTIC ADDITIONAL (OPHTHALMIC RELATED) ×2 IMPLANT
WATER STERILE IRR 250ML POUR (IV SOLUTION) ×2 IMPLANT

## 2020-01-03 NOTE — Anesthesia Procedure Notes (Signed)
Procedure Name: MAC Date/Time: 01/03/2020 10:37 AM Performed by: Vista Deck, CRNA Pre-anesthesia Checklist: Patient identified, Emergency Drugs available, Suction available, Timeout performed and Patient being monitored Patient Re-evaluated:Patient Re-evaluated prior to induction Oxygen Delivery Method: Nasal Cannula

## 2020-01-03 NOTE — Anesthesia Preprocedure Evaluation (Addendum)
Anesthesia Evaluation  Patient identified by MRN, date of birth, ID band Patient awake  General Assessment Comment:Patient just finished 12 weeks of chemotherapy  Reviewed: Allergy & Precautions, H&P , NPO status , Patient's Chart, lab work & pertinent test results, reviewed documented beta blocker date and time   History of Anesthesia Complications Negative for: history of anesthetic complications  Airway Mallampati: II  TM Distance: >3 FB Neck ROM: full    Dental no notable dental hx. (+) Poor Dentition   Pulmonary neg pulmonary ROS, former smoker,    Pulmonary exam normal breath sounds clear to auscultation       Cardiovascular Exercise Tolerance: Good hypertension, negative cardio ROS Normal cardiovascular exam Rhythm:regular Rate:Normal     Neuro/Psych negative neurological ROS  negative psych ROS   GI/Hepatic negative GI ROS, Neg liver ROS,   Endo/Other  negative endocrine ROSdiabetes, Well Controlled, Type 2  Renal/GU negative Renal ROS  negative genitourinary   Musculoskeletal  (+) Arthritis ,   Abdominal Normal abdominal exam  (+)   Peds  Hematology negative hematology ROS (+)   Anesthesia Other Findings   Reproductive/Obstetrics negative OB ROS                             Anesthesia Physical  Anesthesia Plan  ASA: II  Anesthesia Plan: MAC   Post-op Pain Management:    Induction: Intravenous  PONV Risk Score and Plan: 2 and 3 and Treatment may vary due to age or medical condition  Airway Management Planned:   Additional Equipment:   Intra-op Plan:   Post-operative Plan:   Informed Consent: I have reviewed the patients History and Physical, chart, labs and discussed the procedure including the risks, benefits and alternatives for the proposed anesthesia with the patient or authorized representative who has indicated his/her understanding and acceptance.      Dental Advisory Given  Plan Discussed with: CRNA  Anesthesia Plan Comments:       Anesthesia Quick Evaluation

## 2020-01-03 NOTE — Discharge Instructions (Signed)
Please discharge patient when stable, will follow up today with Dr. Oleda Borski at the [facility] [place] office immediately following discharge.  Leave shield in place until visit.  All paperwork with discharge instructions will be given at the office.  [facility] Address:  [address]  [place], [place] [address]  

## 2020-01-03 NOTE — Op Note (Signed)
Date of procedure: 01/03/20  Pre-operative diagnosis: Visually significant age-related combined cataract, Left Eye (H25.812)  Post-operative diagnosis: Visually significant age-related combined cataract, Left Eye (H25.812)  Procedure: Removal of cataract via phacoemulsification and insertion of intra-ocular lens Wynetta Emery and [place]  +22.5D into the capsular bag of the Left Eye  Attending surgeon: Gerda Diss. Monzerat Handler, MD, MA  Anesthesia: MAC, Topical Akten  Complications: None  Estimated Blood Loss: <60m (minimal)  Specimens: None  Implants: As above  Indications:  Visually significant age-related cataract, Left Eye  Procedure:  The patient was seen and identified in the pre-operative area. The operative eye was identified and dilated.  The operative eye was marked.  Topical anesthesia was administered to the operative eye.     The patient was then to the operative suite and placed in the supine position.  A timeout was performed confirming the patient, procedure to be performed, and all other relevant information.   The patient's face was prepped and draped in the usual fashion for intra-ocular surgery.  A lid speculum was placed into the operative eye and the surgical microscope moved into place and focused.  An inferotemporal paracentesis was created using a 20 gauge paracentesis blade.  Shugarcaine was injected into the anterior chamber.  Viscoelastic was injected into the anterior chamber.  A temporal clear-corneal main wound incision was created using a 2.422mmicrokeratome.  A continuous curvilinear capsulorrhexis was initiated using an irrigating cystitome and completed using capsulorrhexis forceps.  Hydrodissection and hydrodeliniation were performed.  Viscoelastic was injected into the anterior chamber.  A phacoemulsification handpiece and a chopper as a second instrument were used to remove the nucleus and epinucleus. The irrigation/aspiration handpiece was used to remove  any remaining cortical material.   The capsular bag was reinflated with viscoelastic, checked, and found to be intact.  The intraocular lens was inserted into the capsular bag.  The irrigation/aspiration handpiece was used to remove any remaining viscoelastic.  The clear corneal wound and paracentesis wounds were then hydrated and checked with Weck-Cels to be watertight.  The lid-speculum was removed.  The drape was removed.  The patient's face was cleaned with a wet and dry 4x4.   Maxitrol was instilled in the eye. A clear shield was taped over the eye. The patient was taken to the post-operative care unit in good condition, having tolerated the procedure well.  Post-Op Instructions: [facility] and will receive all other orders and instructions.

## 2020-01-03 NOTE — Interval H&P Note (Signed)
History and Physical Interval Note:  01/03/2020 10:30 AM  [name]  has presented today for surgery, with the diagnosis of Nuclear sclerotic cataract - Left eye.  The various methods of treatment have been discussed with the patient and family. After consideration of risks, benefits and other options for treatment, the patient has consented to  Procedure(s) with comments: CATARACT EXTRACTION PHACO AND INTRAOCULAR LENS PLACEMENT (IOC) (Left) - CDE:  as a surgical intervention.  The patient's history has been reviewed, patient examined, no change in status, stable for surgery.  I have reviewed the patient's chart and labs.  Questions were answered to the patient's satisfaction.     Baruch Goldmann

## 2020-01-03 NOTE — Transfer of Care (Signed)
Immediate Anesthesia Transfer of Care Note  Patient: [name]  Procedure(s) Performed: CATARACT EXTRACTION PHACO AND INTRAOCULAR LENS PLACEMENT LEFT EYE (Left Eye)  Patient Location: Short Stay  Anesthesia Type:MAC  Level of Consciousness: awake, alert  and patient cooperative  Airway & Oxygen Therapy: Patient Spontanous Breathing  Post-op Assessment: Report given to RN and Post -op Vital signs reviewed and stable  Post vital signs: Reviewed and stable  Last Vitals:  Vitals Value Taken Time  BP    Temp    Pulse    Resp    SpO2      Last Pain:  Vitals:   01/03/20 0931  TempSrc: Oral  PainSc: 0-No pain      Patients Stated Pain Goal: 6 (60/60/04 5997)  Complications: No complications documented.

## 2020-01-03 NOTE — Anesthesia Postprocedure Evaluation (Signed)
Anesthesia Post Note  Patient: [name]  Procedure(s) Performed: CATARACT EXTRACTION PHACO AND INTRAOCULAR LENS PLACEMENT LEFT EYE (Left Eye)  Patient location during evaluation: Phase II Anesthesia Type: General Level of consciousness: awake and alert and patient cooperative Pain management: satisfactory to patient Vital Signs Assessment: post-procedure vital signs reviewed and stable Respiratory status: spontaneous breathing Cardiovascular status: stable Postop Assessment: no apparent nausea or vomiting Anesthetic complications: no   No complications documented.   Last Vitals:  Vitals:   01/03/20 0931 01/03/20 1100  BP: 133/74 (!) 144/77  Pulse: 72 74  Resp: 15 16  Temp: 36.8 C 36.6 C  SpO2: 96% 99%    Last Pain:  Vitals:   01/03/20 1100  TempSrc: Oral  PainSc: 0-No pain                 Salina Stanfield

## 2020-01-04 ENCOUNTER — Encounter (HOSPITAL_COMMUNITY): Payer: Self-pay | Admitting: Ophthalmology

## 2020-01-25 ENCOUNTER — Other Ambulatory Visit: Payer: Self-pay | Admitting: Orthopedic Surgery

## 2020-01-25 DIAGNOSIS — M1711 Unilateral primary osteoarthritis, right knee: Secondary | ICD-10-CM

## 2020-01-26 ENCOUNTER — Other Ambulatory Visit: Payer: Self-pay | Admitting: Orthopedic Surgery

## 2020-01-26 DIAGNOSIS — M1711 Unilateral primary osteoarthritis, right knee: Secondary | ICD-10-CM

## 2020-01-26 MED ORDER — TRAMADOL-ACETAMINOPHEN 37.5-325 MG PO TABS: 1.0000 | ORAL_TABLET | ORAL | 0 refills | Status: AC | PRN

## 2020-01-26 NOTE — Progress Notes (Signed)
Meds ordered this encounter  Medications  . traMADol-acetaminophen (ULTRACET) 37.5-325 MG tablet    Sig: Take 1 tablet by mouth every 4 (four) hours as needed for up to 7 days.    Dispense:  42 tablet    Refill:  0

## 2020-02-05 ENCOUNTER — Other Ambulatory Visit: Payer: Self-pay | Admitting: Orthopedic Surgery

## 2020-02-05 DIAGNOSIS — M1711 Unilateral primary osteoarthritis, right knee: Secondary | ICD-10-CM

## 2020-02-05 DIAGNOSIS — M25462 Effusion, left knee: Secondary | ICD-10-CM

## 2020-02-27 ENCOUNTER — Other Ambulatory Visit: Payer: Self-pay

## 2020-02-27 ENCOUNTER — Emergency Department (HOSPITAL_COMMUNITY): Admission: EM | Admit: 2020-02-27 | Discharge: 2020-02-27 | Discharge status: Against Medical Advice | Payer: 59

## 2020-03-22 ENCOUNTER — Other Ambulatory Visit: Payer: Self-pay

## 2020-03-22 ENCOUNTER — Inpatient Hospital Stay (HOSPITAL_COMMUNITY): Payer: 59 | Attending: Hematology

## 2020-03-22 DIAGNOSIS — I10 Essential (primary) hypertension: Secondary | ICD-10-CM | POA: Insufficient documentation

## 2020-03-22 DIAGNOSIS — Z79811 Long term (current) use of aromatase inhibitors: Secondary | ICD-10-CM | POA: Insufficient documentation

## 2020-03-22 DIAGNOSIS — Z90710 Acquired absence of both cervix and uterus: Secondary | ICD-10-CM | POA: Insufficient documentation

## 2020-03-22 DIAGNOSIS — Z87891 Personal history of nicotine dependence: Secondary | ICD-10-CM | POA: Insufficient documentation

## 2020-03-22 DIAGNOSIS — Z79899 Other long term (current) drug therapy: Secondary | ICD-10-CM | POA: Insufficient documentation

## 2020-03-22 DIAGNOSIS — Z9221 Personal history of antineoplastic chemotherapy: Secondary | ICD-10-CM | POA: Insufficient documentation

## 2020-03-22 DIAGNOSIS — Z170 Estrogen receptor positive status [ER+]: Secondary | ICD-10-CM | POA: Insufficient documentation

## 2020-03-22 DIAGNOSIS — Z7982 Long term (current) use of aspirin: Secondary | ICD-10-CM | POA: Insufficient documentation

## 2020-03-22 DIAGNOSIS — Z9079 Acquired absence of other genital organ(s): Secondary | ICD-10-CM | POA: Insufficient documentation

## 2020-03-22 DIAGNOSIS — Z452 Encounter for adjustment and management of vascular access device: Secondary | ICD-10-CM | POA: Insufficient documentation

## 2020-03-22 DIAGNOSIS — C50411 Malignant neoplasm of upper-outer quadrant of right female breast: Secondary | ICD-10-CM | POA: Insufficient documentation

## 2020-03-22 DIAGNOSIS — Z90722 Acquired absence of ovaries, bilateral: Secondary | ICD-10-CM | POA: Insufficient documentation

## 2020-03-22 DIAGNOSIS — E119 Type 2 diabetes mellitus without complications: Secondary | ICD-10-CM | POA: Insufficient documentation

## 2020-05-16 ENCOUNTER — Other Ambulatory Visit (HOSPITAL_COMMUNITY): Payer: Self-pay

## 2020-05-16 DIAGNOSIS — C50411 Malignant neoplasm of upper-outer quadrant of right female breast: Secondary | ICD-10-CM

## 2020-05-16 DIAGNOSIS — Z170 Estrogen receptor positive status [ER+]: Secondary | ICD-10-CM

## 2020-05-17 ENCOUNTER — Other Ambulatory Visit: Payer: Self-pay

## 2020-05-17 ENCOUNTER — Inpatient Hospital Stay (HOSPITAL_COMMUNITY): Payer: 59 | Attending: Hematology

## 2020-05-17 DIAGNOSIS — I10 Essential (primary) hypertension: Secondary | ICD-10-CM | POA: Diagnosis not present

## 2020-05-17 DIAGNOSIS — Z7984 Long term (current) use of oral hypoglycemic drugs: Secondary | ICD-10-CM | POA: Diagnosis not present

## 2020-05-17 DIAGNOSIS — Z87891 Personal history of nicotine dependence: Secondary | ICD-10-CM | POA: Insufficient documentation

## 2020-05-17 DIAGNOSIS — Z90710 Acquired absence of both cervix and uterus: Secondary | ICD-10-CM | POA: Diagnosis not present

## 2020-05-17 DIAGNOSIS — R7989 Other specified abnormal findings of blood chemistry: Secondary | ICD-10-CM | POA: Insufficient documentation

## 2020-05-17 DIAGNOSIS — Z79899 Other long term (current) drug therapy: Secondary | ICD-10-CM | POA: Insufficient documentation

## 2020-05-17 DIAGNOSIS — Z79811 Long term (current) use of aromatase inhibitors: Secondary | ICD-10-CM | POA: Insufficient documentation

## 2020-05-17 DIAGNOSIS — Z8249 Family history of ischemic heart disease and other diseases of the circulatory system: Secondary | ICD-10-CM | POA: Insufficient documentation

## 2020-05-17 DIAGNOSIS — Z9079 Acquired absence of other genital organ(s): Secondary | ICD-10-CM | POA: Diagnosis not present

## 2020-05-17 DIAGNOSIS — Z833 Family history of diabetes mellitus: Secondary | ICD-10-CM | POA: Diagnosis not present

## 2020-05-17 DIAGNOSIS — E119 Type 2 diabetes mellitus without complications: Secondary | ICD-10-CM | POA: Insufficient documentation

## 2020-05-17 DIAGNOSIS — Z90722 Acquired absence of ovaries, bilateral: Secondary | ICD-10-CM | POA: Insufficient documentation

## 2020-05-17 DIAGNOSIS — C50411 Malignant neoplasm of upper-outer quadrant of right female breast: Secondary | ICD-10-CM | POA: Insufficient documentation

## 2020-05-17 DIAGNOSIS — Z170 Estrogen receptor positive status [ER+]: Secondary | ICD-10-CM | POA: Diagnosis not present

## 2020-05-17 DIAGNOSIS — Z7982 Long term (current) use of aspirin: Secondary | ICD-10-CM | POA: Insufficient documentation

## 2020-05-17 LAB — CBC WITH DIFFERENTIAL/PLATELET
Abs Immature Granulocytes: 0.01 10*3/uL (ref 0.00–0.07)
Basophils Absolute: 0 10*3/uL (ref 0.0–0.1)
Basophils Relative: 1 %
Eosinophils Absolute: 0.2 10*3/uL (ref 0.0–0.5)
Eosinophils Relative: 4 %
HCT: 37.5 % (ref 36.0–46.0)
Hemoglobin: 12.4 g/dL (ref 12.0–15.0)
Immature Granulocytes: 0 %
Lymphocytes Relative: 32 %
Lymphs Abs: 1.4 10*3/uL (ref 0.7–4.0)
MCH: 29.5 pg (ref 26.0–34.0)
MCHC: 33.1 g/dL (ref 30.0–36.0)
MCV: 89.1 fL (ref 80.0–100.0)
Monocytes Absolute: 0.4 10*3/uL (ref 0.1–1.0)
Monocytes Relative: 8 %
Neutro Abs: 2.5 10*3/uL (ref 1.7–7.7)
Neutrophils Relative %: 55 %
Platelets: 145 10*3/uL — ABNORMAL LOW (ref 150–400)
RBC: 4.21 MIL/uL (ref 3.87–5.11)
RDW: 13.2 % (ref 11.5–15.5)
WBC: 4.5 10*3/uL (ref 4.0–10.5)
nRBC: 0 % (ref 0.0–0.2)

## 2020-05-17 LAB — COMPREHENSIVE METABOLIC PANEL
ALT: 23 U/L (ref 0–44)
AST: 18 U/L (ref 15–41)
Albumin: 3.8 g/dL (ref 3.5–5.0)
Alkaline Phosphatase: 94 U/L (ref 38–126)
Anion gap: 11 (ref 5–15)
BUN: 44 mg/dL — ABNORMAL HIGH (ref 8–23)
CO2: 24 mmol/L (ref 22–32)
Calcium: 9.4 mg/dL (ref 8.9–10.3)
Chloride: 102 mmol/L (ref 98–111)
Creatinine, Ser: 1.48 mg/dL — ABNORMAL HIGH (ref 0.44–1.00)
GFR, Estimated: 40 mL/min — ABNORMAL LOW (ref >60)
Glucose, Bld: 142 mg/dL — ABNORMAL HIGH (ref 70–99)
Potassium: 3.5 mmol/L (ref 3.5–5.1)
Sodium: 137 mmol/L (ref 135–145)
Total Bilirubin: 0.7 mg/dL (ref 0.3–1.2)
Total Protein: 7.7 g/dL (ref 6.5–8.1)

## 2020-05-17 LAB — LACTATE DEHYDROGENASE: LDH: 198 U/L — ABNORMAL HIGH (ref 98–192)

## 2020-05-17 LAB — FOLATE: Folate: 9.4 ng/mL (ref >5.9)

## 2020-05-17 LAB — VITAMIN B12: Vitamin B-12: 827 pg/mL (ref 180–914)

## 2020-05-17 LAB — VITAMIN D 25 HYDROXY (VIT D DEFICIENCY, FRACTURES): Vit D, 25-Hydroxy: 54.74 ng/mL (ref 30–100)

## 2020-05-24 ENCOUNTER — Other Ambulatory Visit: Payer: Self-pay

## 2020-05-24 ENCOUNTER — Other Ambulatory Visit (HOSPITAL_COMMUNITY): Payer: Self-pay | Admitting: Oncology

## 2020-05-24 ENCOUNTER — Inpatient Hospital Stay (HOSPITAL_BASED_OUTPATIENT_CLINIC_OR_DEPARTMENT_OTHER): Payer: 59 | Admitting: Oncology

## 2020-05-24 VITALS — BP 128/75 | HR 87 | Temp 98.1°F | Resp 16 | Wt 209.4 lb

## 2020-05-24 DIAGNOSIS — Z170 Estrogen receptor positive status [ER+]: Secondary | ICD-10-CM | POA: Diagnosis not present

## 2020-05-24 DIAGNOSIS — C50411 Malignant neoplasm of upper-outer quadrant of right female breast: Secondary | ICD-10-CM

## 2020-05-24 DIAGNOSIS — R7989 Other specified abnormal findings of blood chemistry: Secondary | ICD-10-CM | POA: Diagnosis not present

## 2020-05-24 NOTE — Progress Notes (Signed)
[place], [place] [address]   CLINIC:  Medical Oncology/Hematology  PCP:  Monico Blitz, [place] [address] [phone]   REASON FOR VISIT: Follow-up for breast cancer  CURRENT THERAPY: Anastrozole  BRIEF ONCOLOGIC HISTORY:  Oncology History  Breast cancer of upper-outer quadrant of right female breast ([place])  06/24/2016 Mammogram   Diagnostic bilateral mammogram: highly suspicious 3.5 cm mass in the UO R breast with marked enlarged R axillary LN   06/24/2016 Breast US   2.7 x 3.1 x 3.5 cm slightly irregular hypoechoic mass at the 10:00 position of right breast, 6 cm from the nipple.  4.1 x 6 cm right axillar lymph node also identified.   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   07/21/2016 Imaging   CT CAP- 2.9 cm right upper outer quadrant breast mass, consistent with known primary breast carcinoma.  Right axillary and subpectoral lymphadenopathy, consistent with metastatic disease.  Tiny sub-cm bilateral pulmonary nodules are indeterminate, but favor postinflammatory etiology over early metastases. Recommend continued attention on follow-up CT.  No evidence of metastatic disease within the abdomen or pelvis.   07/23/2016 Procedure   Port placed by Dr. Barry Dienes   07/24/2016 - 12/04/2016 Neo-Adjuvant Chemotherapy   Adriamycin/Cytoxan x 4, followed by weekly Taxol x 12    07/30/2016 Imaging   Bone scan: Negative for osseous disease .   09/22/2016 Imaging   CT head- 1. Normal CT of the brain. No evidence of intracranial metastatic disease. 2. Frothy secretions throughout most of the paranasal sinuses. Correlate clinically for acute  sinusitis. This could serve as a source of headache.   12/30/2016 Surgery   Right lumpectomy with axillary lymph node dissection by Dr. Arnoldo Morale. Surgical path: 1. Breast, lumpectomy, right - FIBROCYSTIC AND FIBROADENOMATOID CHANGE. - USUAL DUCTAL HYPERPLASIA. - TREATMENT EFFECT. - NO RESIDUAL CARCINOMA IDENTIFIED. - SEE ONCOLOGY TABLE. 2. Lymph nodes, regional resection, right axillary contents - NINE OF NINE LYMPH NODES NEGATIVE FOR CARCINOMA (0/9). - TREATMENT EFFECT. Final stage ypT0 ypN0     CANCER STAGING: Cancer Staging Breast cancer of upper-outer quadrant of right female breast [facility]) Staging form: Breast, AJCC 8th Edition - Clinical stage from 08/07/2016: No Stage Recommended (ycT2, cN1(sn), cM0, G3, ER: Positive, PR: Negative, HER2: Negative) - Signed by Baird Cancer, PA-C on 08/07/2016    INTERVAL HISTORY:  Ms. Hagerty 64 y.o. female returns for routine follow-up for breast cancer.  Patient reports she is doing well since her last visit.  She denies any new lumps or bumps present.  She denies any new bone pain.  She is taking her anastrozole as prescribed with no side effects. Denies any nausea, vomiting, or diarrhea. Denies any new pains. Had not noticed any recent bleeding such as epistaxis, hematuria or hematochezia. Denies recent chest pain on exertion, shortness of breath on minimal exertion, pre-syncopal episodes, or palpitations. Denies any numbness or tingling in hands or feet. Denies any recent fevers, infections, or recent hospitalizations. Patient reports appetite at 100% and energy level at 50%.  She is eating well maintain her weight at this time.   REVIEW OF SYSTEMS:  Review of Systems  Respiratory: Positive for  shortness of breath.   All other systems reviewed and are negative.    PAST MEDICAL/SURGICAL HISTORY:  Past Medical History:  Diagnosis Date  . Arthritis   . Breast cancer of upper-outer quadrant of right female breast ([facility]) 07/14/2016  . Cancer  ([facility] [facility])   . Cough    dry  . Diabetes mellitus without complication ([facility])   . Hypertension   . Ruptured eardrum    left    Past Surgical History:  Procedure Laterality Date  . ABDOMINAL HYSTERECTOMY     with bilateral bso  . AXILLARY LYMPH NODE DISSECTION Right 12/30/2016   Procedure: AXILLARY LYMPH NODE DISSECTION;  Surgeon: Aviva Signs, MD;  Location: [facility];  Service: General;  Laterality: Right;  . CATARACT EXTRACTION W/PHACO Left 01/03/2020   Procedure: CATARACT EXTRACTION PHACO AND INTRAOCULAR LENS PLACEMENT LEFT EYE;  Surgeon: Baruch Goldmann, MD;  Location: [facility];  Service: Ophthalmology;  Laterality: Left;  CDE: 5.58  . COLONOSCOPY    . otif wrist Right   . PARTIAL MASTECTOMY WITH NEEDLE LOCALIZATION Right 12/30/2016   Procedure: PARTIAL MASTECTOMY WITH NEEDLE LOCALIZATION;  Surgeon: Aviva Signs, MD;  Location: [facility];  Service: General;  Laterality: Right;  . PORTACATH PLACEMENT Left 07/23/2016   Procedure: INSERTION PORT-A-CATH;  Surgeon: Stark Klein, MD;  Location: [facility];  Service: General;  Laterality: Left;  . RADIOLOGY WITH ANESTHESIA N/A 08/19/2017   Procedure: MRI WITH ANESTHESIA OF BRAIN WITHOUT CONTRAST;  Surgeon: Radiologist, Medication, MD;  Location: [facility];  Service: Radiology;  Laterality: N/A;  . ruptured ear drum Left      SOCIAL HISTORY:  Social History   Socioeconomic History  . Marital status: Single    Spouse name: Not on file  . Number of children: Not on file  . Years of education: Not on file  . Highest education level: Not on file  Occupational History  . Not on file  Tobacco Use  . Smoking status: Former Smoker    Packs/day: 0.50    Years: 4.00    Pack years: 2.00    Types: Cigarettes  . Smokeless tobacco: Never Used  Vaping Use  . Vaping Use: Never used  Substance and Sexual Activity  . Alcohol use: No  . Drug use: No  . Sexual activity: Not Currently    Birth control/protection: Surgical  Other Topics Concern    . Not on file  Social History Narrative  . Not on file   Social Determinants of Health   Financial Resource Strain:   . Difficulty of Paying Living Expenses: Not on file  Food Insecurity:   . Worried About Charity fundraiser in the Last Year: Not on file  . Ran Out of Food in the Last Year: Not on file  Transportation Needs:   . Lack of Transportation (Medical): Not on file  . Lack of Transportation (Non-Medical): Not on file  Physical Activity:   . Days of Exercise per Week: Not on file  . Minutes of Exercise per Session: Not on file  Stress:   . Feeling of Stress : Not on file  Social Connections:   . Frequency of Communication with Friends and Family: Not on file  . Frequency of Social Gatherings with Friends and Family: Not on file  . Attends Religious Services: Not on file  . Active Member of Clubs or Organizations: Not on file  . Attends Archivist Meetings: Not on file  . Marital Status: Not on file  Intimate Partner Violence:   . Fear of Current or Ex-Partner: Not on file  . Emotionally Abused: Not on file  . Physically Abused: Not on file  . Sexually Abused: Not on file    FAMILY HISTORY:  Family History  Problem Relation Age of Onset  . Heart disease Mother   . Diabetes Mother   . Liver disease Father   . Stroke Brother     CURRENT MEDICATIONS:  Outpatient Encounter Medications as of 05/24/2020  Medication Sig  . amLODipine (NORVASC) 10 MG tablet Take 10 mg by mouth daily.  [name] anastrozole (ARIMIDEX) 1 MG tablet TAKE 1 TABLET BY MOUTH EVERY DAY (Patient taking differently: Take 1 mg by mouth daily. )  . aspirin EC 81 MG tablet Take 81 mg by mouth daily.  [name] atorvastatin (LIPITOR) 10 MG tablet Take 10 mg by mouth daily at 6 PM.   . Calcium 250 MG CAPS Take 500 mg by mouth in the morning and at bedtime.   . cholecalciferol (VITAMIN D) 1000 units tablet Take 1,000 Units by mouth daily.  . diclofenac (VOLTAREN) 75 MG EC tablet Take 1 tablet (75 mg  total) by mouth 2 (two) times daily.  [name] glyBURIDE (DIABETA) 5 MG tablet Take 5 mg by mouth daily with breakfast.  . hydrochlorothiazide (HYDRODIURIL) 25 MG tablet Take 25 mg by mouth daily.  [name] JARDIANCE 10 MG TABS tablet Take 10 mg by mouth daily.  [name] lisinopril (PRINIVIL,ZESTRIL) 40 MG tablet Take 40 mg by mouth daily.  . pioglitazone (ACTOS) 30 MG tablet Take 30 mg by mouth daily.    No facility-administered encounter medications on file as of 05/24/2020.    ALLERGIES:  No Known Allergies   PHYSICAL EXAM:  ECOG Performance status: 1  Vitals:   05/24/20 1121  BP: 128/75  Pulse: 87  Resp: 16  Temp: 98.1 F (36.7 C)  SpO2: 98%   Filed Weights   05/24/20 1121  Weight: 209 lb 6.4 oz (95 kg)   Physical Exam Constitutional:      Appearance: Normal appearance. She is normal weight.  Cardiovascular:     Rate and Rhythm: Normal rate and regular rhythm.     Heart sounds: Normal heart sounds.  Pulmonary:     Effort: Pulmonary effort is normal.     Breath sounds: Normal breath sounds.  Abdominal:     General: Bowel sounds are normal.     Palpations: Abdomen is soft.  Musculoskeletal:        General: Normal range of motion.  Skin:    General: Skin is warm.  Neurological:     Mental Status: She is alert and oriented to person, place, and time. Mental status is at baseline.  Psychiatric:        Mood and Affect: Mood normal.        Behavior: Behavior normal.        Thought Content: Thought content normal.        Judgment: Judgment normal.      LABORATORY DATA:  I have reviewed the labs as listed.  CBC    Component Value Date/Time   WBC 4.5 05/17/2020 1111   RBC 4.21 05/17/2020 1111   HGB 12.4 05/17/2020 1111   HCT 37.5 05/17/2020 1111   PLT 145 (L) 05/17/2020 1111   MCV 89.1 05/17/2020 1111   MCH 29.5 05/17/2020 1111   MCHC 33.1 05/17/2020 1111   RDW 13.2 05/17/2020 1111   LYMPHSABS 1.4 05/17/2020 1111   MONOABS  0.4 05/17/2020 1111   EOSABS 0.2 05/17/2020 1111    BASOSABS 0.0 05/17/2020 1111   CMP Latest Ref Rng & Units 05/17/2020 01/01/2020 11/15/2019  Glucose 70 - 99 mg/dL 142(H) 179(H) 157(H)  BUN 8 - 23 mg/dL 44(H) 35(H) 39(H)  Creatinine 0.44 - 1.00 mg/dL 1.48(H) 1.40(H) 1.45(H)  Sodium 135 - 145 mmol/L 137 139 138  Potassium 3.5 - 5.1 mmol/L 3.5 3.7 3.4(L)  Chloride 98 - 111 mmol/L 102 104 104  CO2 22 - 32 mmol/L 24 23 24   Calcium 8.9 - 10.3 mg/dL 9.4 9.6 9.5  Total Protein 6.5 - 8.1 g/dL 7.7 - 7.2  Total Bilirubin 0.3 - 1.2 mg/dL 0.7 - 0.7  Alkaline Phos 38 - 126 U/L 94 - 108  AST 15 - 41 U/L 18 - 23  ALT 0 - 44 U/L 23 - 28    DIAGNOSTIC IMAGING:  I have independently reviewed the mammogram and bone density scans and discussed with the patient.  ASSESSMENT & PLAN:  1.  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 -Neoadjuvant AC x4 followed by Taxol x12 from 07/24/2016 through 12/04/2016. -Status post right lumpectomy, L&D on 12/30/2016 with PCR, YPT0, YPN0 -Anastrozole was started in August 2018 -She had a mammogram on 09/27/2018 that was BI-RADS Category 4 suspicious.  Recommended stereotactic biopsy of the superior aspect of the lumpectomy site of increasing distortion and density. -Biopsy on 10/06/2018 of the right breast upper outer quadrant which shows benign breast parenchyma with fibrosis and hemosidern containing macrophages.  Negative for carcinoma. -Mammogram from 10/10/2019 stable appearance of right breast lumpectomy site.  Diagnostic mammogram is suggested in 1 year. -Physical assessment today reveals thickness of the lumpectomy scar in the upper outer quadrant.  No palpable masses no palpable adenopathy. -Labs done on 05/17/2020 showed potassium 3.5, creatinine 1.48 WBC 4.5, hemoglobin 12.4, platelets 145. -She will continue on anastrozole. -She will follow-up in 6 months with repeat labs.  2.  Bone density: -DEXA scan on 05/20/2017  showed T score of -0.6 normal. -Last DEXA scan on 10/10/2019 showed a T score of -1.0 -She is to continue to take her calcium twice a day and vitamin D once daily. -Labs done on 05/17/2020 show vitamin D level of 54.74.  3.  Elevated creatinine: -Creatinine has been ranging between 1.40-1.48 over the past 6 months. -Recommend she hydrate as much as she can. -Reviewed nephrotoxic medications.  She denies NSAID use. -We will place referral for nephrology.  Disposition: RTC in 6 months with lab work, assessment and review mammogram.  Orders placed this encounter:  Orders Placed This Encounter  Procedures  . MM DIAG BREAST TOMO BILATERAL   Greater than 50% was spent in counseling and coordination of care with this patient including but not limited to discussion of the relevant topics above (See A&P) including, but not limited to diagnosis and management of acute and chronic medical conditions.   Faythe Casa, NP 05/24/2020 3:01 PM  [place] [phone]

## 2020-07-03 ENCOUNTER — Other Ambulatory Visit: Payer: Self-pay | Admitting: Orthopedic Surgery

## 2020-07-03 DIAGNOSIS — M1711 Unilateral primary osteoarthritis, right knee: Secondary | ICD-10-CM

## 2020-07-03 DIAGNOSIS — M25562 Pain in left knee: Secondary | ICD-10-CM

## 2020-07-27 ENCOUNTER — Other Ambulatory Visit (HOSPITAL_COMMUNITY): Payer: Self-pay | Admitting: Hematology

## 2020-10-14 ENCOUNTER — Other Ambulatory Visit (HOSPITAL_COMMUNITY): Payer: Self-pay

## 2020-10-14 DIAGNOSIS — Z170 Estrogen receptor positive status [ER+]: Secondary | ICD-10-CM

## 2020-10-14 DIAGNOSIS — R7989 Other specified abnormal findings of blood chemistry: Secondary | ICD-10-CM

## 2020-10-14 DIAGNOSIS — C50411 Malignant neoplasm of upper-outer quadrant of right female breast: Secondary | ICD-10-CM

## 2020-10-15 ENCOUNTER — Ambulatory Visit (HOSPITAL_COMMUNITY)
Admission: RE | Admit: 2020-10-15 | Discharge: 2020-10-15 | Disposition: A | Discharge status: Home / Self Care | Payer: BC Managed Care – PPO | Source: Ambulatory Visit | Attending: Oncology | Admitting: Oncology

## 2020-10-15 ENCOUNTER — Inpatient Hospital Stay (HOSPITAL_COMMUNITY): Payer: BC Managed Care – PPO | Attending: Hematology

## 2020-10-15 ENCOUNTER — Other Ambulatory Visit: Payer: Self-pay

## 2020-10-15 DIAGNOSIS — C50411 Malignant neoplasm of upper-outer quadrant of right female breast: Secondary | ICD-10-CM | POA: Diagnosis not present

## 2020-10-15 DIAGNOSIS — Z170 Estrogen receptor positive status [ER+]: Secondary | ICD-10-CM | POA: Insufficient documentation

## 2020-10-15 DIAGNOSIS — C773 Secondary and unspecified malignant neoplasm of axilla and upper limb lymph nodes: Secondary | ICD-10-CM | POA: Insufficient documentation

## 2020-10-15 DIAGNOSIS — Z79811 Long term (current) use of aromatase inhibitors: Secondary | ICD-10-CM | POA: Insufficient documentation

## 2020-10-22 ENCOUNTER — Encounter (HOSPITAL_COMMUNITY): Payer: Self-pay | Admitting: Hematology

## 2020-10-22 ENCOUNTER — Inpatient Hospital Stay (HOSPITAL_BASED_OUTPATIENT_CLINIC_OR_DEPARTMENT_OTHER): Payer: BC Managed Care – PPO | Admitting: Hematology

## 2020-10-22 ENCOUNTER — Other Ambulatory Visit: Payer: Self-pay

## 2020-10-22 VITALS — BP 152/61 | HR 85 | Temp 98.5°F | Resp 18 | Wt 214.0 lb

## 2020-10-22 DIAGNOSIS — C773 Secondary and unspecified malignant neoplasm of axilla and upper limb lymph nodes: Secondary | ICD-10-CM | POA: Diagnosis not present

## 2020-10-22 DIAGNOSIS — C50411 Malignant neoplasm of upper-outer quadrant of right female breast: Secondary | ICD-10-CM

## 2020-10-22 DIAGNOSIS — Z79811 Long term (current) use of aromatase inhibitors: Secondary | ICD-10-CM | POA: Diagnosis not present

## 2020-10-22 DIAGNOSIS — Z170 Estrogen receptor positive status [ER+]: Secondary | ICD-10-CM

## 2020-10-22 NOTE — Patient Instructions (Signed)
[place] at [facility] Discharge Instructions  You were seen today by Dr. Delton Coombes. He went over your recent results and scans. Your next mammogram will be scheduled after October 15, 2021. You will be referred to Dr. Arnoldo Morale to have the port removed. Dr. Delton Coombes will see you back in 6 months for labs and follow up.   Thank you for choosing [place] at [facility] to provide your oncology and hematology care.  To afford each patient quality time with our provider, please arrive at least 15 minutes before your scheduled appointment time.   If you have a lab appointment with the [facility] please come in thru the Main Entrance and check in at the main information desk  You need to re-schedule your appointment should you arrive 10 or more minutes late.  We strive to give you quality time with our providers, and arriving late affects you and other patients whose appointments are after yours.  Also, if you no show three or more times for appointments you may be dismissed from the clinic at the providers discretion.     Again, thank you for choosing [facility].  Our hope is that these requests will decrease the amount of time that you wait before being seen by our physicians.       _____________________________________________________________  Should you have questions after your visit to [facility], please contact our office at [phone] between the hours of 8:00 a.m. and 4:30 p.m.  Voicemails left after 4:00 p.m. will not be returned until the following business day.  For prescription refill requests, have your pharmacy contact our office and allow 72 hours.    Cancer Center Support Programs:   > Cancer Support Group  2nd Tuesday of the month 1pm-2pm, Journey Room

## 2020-10-22 NOTE — Progress Notes (Signed)
[place] [address], [place] [address]   Patient Care Team: Monico Blitz, MD as PCP - General (Internal Medicine)  SUMMARY OF ONCOLOGIC HISTORY: Oncology History  Breast cancer of upper-outer quadrant of right female breast ([place])  06/24/2016 Mammogram   Diagnostic bilateral mammogram: highly suspicious 3.5 cm mass in the UO R breast with marked enlarged R axillary LN   06/24/2016 Breast US   2.7 x 3.1 x 3.5 cm slightly irregular hypoechoic mass at the 10:00 position of right breast, 6 cm from the nipple.  4.1 x 6 cm right axillar lymph node also identified.   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   07/21/2016 Imaging   CT CAP- 2.9 cm right upper outer quadrant breast mass, consistent with known primary breast carcinoma.  Right axillary and subpectoral lymphadenopathy, consistent with metastatic disease.  Tiny sub-cm bilateral pulmonary nodules are indeterminate, but favor postinflammatory etiology over early metastases. Recommend continued attention on follow-up CT.  No evidence of metastatic disease within the abdomen or pelvis.   07/23/2016 Procedure   Port placed by Dr. Barry Dienes   07/24/2016 - 12/04/2016 Neo-Adjuvant Chemotherapy   Adriamycin/Cytoxan x 4, followed by weekly Taxol x 12    07/30/2016 Imaging   Bone scan: Negative for osseous disease .   09/22/2016 Imaging   CT head- 1. Normal CT of the brain. No evidence of intracranial metastatic disease. 2. Frothy secretions throughout most of the paranasal sinuses. Correlate clinically for acute sinusitis. This could serve as a source of headache.   12/30/2016 Surgery   Right lumpectomy with axillary lymph  node dissection by Dr. Arnoldo Morale. Surgical path: 1. Breast, lumpectomy, right - FIBROCYSTIC AND FIBROADENOMATOID CHANGE. - USUAL DUCTAL HYPERPLASIA. - TREATMENT EFFECT. - NO RESIDUAL CARCINOMA IDENTIFIED. - SEE ONCOLOGY TABLE. 2. Lymph nodes, regional resection, right axillary contents - NINE OF NINE LYMPH NODES NEGATIVE FOR CARCINOMA (0/9). - TREATMENT EFFECT. Final stage ypT0 ypN0     CHIEF COMPLIANT: Follow-up for right breast cancer   INTERVAL HISTORY: Ms. DANAY MCKELLAR is a 65 y.o. female here today for follow up of her right breast cancer. Her last visit was on 05/24/2020.   Today she is accompanied by her daughter and she reports feeling okay. She is taking Arimidex daily and is tolerating it well; she denies having breast pain, hot flashes, joint aches or pains. She is taking calcium and vitamin D daily.   REVIEW OF SYSTEMS:   Review of Systems  Constitutional: Positive for fatigue (50%). Negative for appetite change.  HENT:   Positive for hearing loss.   Cardiovascular: Negative for chest pain.  Endocrine: Negative for hot flashes.  Musculoskeletal: Negative for arthralgias.  All other systems reviewed and are negative.   I have reviewed the past medical history, past surgical history, social history and family history with the patient and they are unchanged from previous note.   ALLERGIES:   has No Known Allergies.   MEDICATIONS:  Current Outpatient Medications  Medication Sig Dispense Refill  . amLODipine (NORVASC) 10 MG tablet Take 10 mg by mouth daily.    [name] anastrozole (ARIMIDEX) 1 MG tablet TAKE 1 TABLET BY MOUTH EVERY DAY 90  tablet 3  . aspirin EC 81 MG tablet Take 81 mg by mouth daily.    [name] atorvastatin (LIPITOR) 10 MG tablet Take 10 mg by mouth daily at 6 PM.   4  . cholecalciferol (VITAMIN D) 1000 units tablet Take 1,000 Units by mouth daily.    . [place] Calcium-Magnesium-Vit D (CORAL CALCIUM PLUS) [phone] MG-MG-UNIT CAPS Take 500 mg by mouth in  the morning and at bedtime.    . diclofenac (VOLTAREN) 75 MG EC tablet TAKE 1 TABLET BY MOUTH TWICE A DAY 60 tablet 5  . glyBURIDE (DIABETA) 5 MG tablet Take 5 mg by mouth daily with breakfast.    . hydrochlorothiazide (HYDRODIURIL) 25 MG tablet Take 25 mg by mouth daily.    [name] JARDIANCE 10 MG TABS tablet Take 10 mg by mouth daily.    [name] lisinopril (PRINIVIL,ZESTRIL) 40 MG tablet Take 40 mg by mouth daily.    . pioglitazone (ACTOS) 30 MG tablet Take 30 mg by mouth daily.      No current facility-administered medications for this visit.     PHYSICAL EXAMINATION: Performance status (ECOG): 1 - Symptomatic but completely ambulatory  Vitals:   10/22/20 1443  BP: (!) 152/61  Pulse: 85  Resp: 18  Temp: 98.5 F (36.9 C)  SpO2: 99%   Wt Readings from Last 3 Encounters:  10/22/20 214 lb (97.1 kg)  05/24/20 209 lb 6.4 oz (95 kg)  01/03/20 201 lb (91.2 kg)   Physical Exam Vitals reviewed.  Constitutional:      Appearance: Normal appearance. She is obese.  Cardiovascular:     Rate and Rhythm: Normal rate and regular rhythm.     Pulses: Normal pulses.     Heart sounds: Normal heart sounds.  Pulmonary:     Effort: Pulmonary effort is normal.     Breath sounds: Normal breath sounds.  Chest:  Breasts:     Right: No swelling, bleeding, inverted nipple, mass, nipple discharge, skin change (lumpectomy scar well-healed at 10 o'clock) or tenderness.     Left: Normal. No swelling, bleeding, inverted nipple, mass, nipple discharge, skin change or tenderness.      Comments: Port-a-Cath in L chest Abdominal:     Palpations: Abdomen is soft. There is no hepatomegaly or mass.     Tenderness: There is no abdominal tenderness.     Hernia: No hernia is present.  Neurological:     General: No focal deficit present.     Mental Status: She is alert and oriented to person, place, and time.  Psychiatric:        Mood and Affect: Mood normal.        Behavior: Behavior normal.     Breast Exam  Chaperone: Milinda Antis, MD     LABORATORY DATA:  I have reviewed the data as listed CMP Latest Ref Rng & Units 05/17/2020 01/01/2020 11/15/2019  Glucose 70 - 99 mg/dL 142(H) 179(H) 157(H)  BUN 8 - 23 mg/dL 44(H) 35(H) 39(H)  Creatinine 0.44 - 1.00 mg/dL 1.48(H) 1.40(H) 1.45(H)  Sodium 135 - 145 mmol/L 137 139 138  Potassium 3.5 - 5.1 mmol/L 3.5 3.7 3.4(L)  Chloride 98 - 111 mmol/L 102 104 104  CO2 22 - 32 mmol/L 24 23 24   Calcium 8.9 - 10.3 mg/dL 9.4 9.6 9.5  Total Protein 6.5 - 8.1 g/dL 7.7 - 7.2  Total Bilirubin 0.3 - 1.2 mg/dL 0.7 - 0.7  Alkaline Phos 38 - 126 U/L 94 - 108  AST 15 -  41 U/L 18 - 23  ALT 0 - 44 U/L 23 - 28   Lab Results  Component Value Date   CAN153 22.5 09/27/2018   CAN153 20.1 05/06/2018   Lab Results  Component Value Date   WBC 4.5 05/17/2020   HGB 12.4 05/17/2020   HCT 37.5 05/17/2020   MCV 89.1 05/17/2020   PLT 145 (L) 05/17/2020   NEUTROABS 2.5 05/17/2020    ASSESSMENT:  1.  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 -Neoadjuvant AC x4 followed by Taxol x12 from 07/24/2016 through 12/04/2016. -Status post right lumpectomy, L&D on 12/30/2016 with PCR, YPT0, YPN0. -Anastrozole was started in August 2018. -She had a mammogram on 09/27/2018 that was BI-RADS Category 4 suspicious.  Recommended stereotactic biopsy of the superior aspect of the lumpectomy site of increasing distortion and density. -Biopsy on 10/06/2018 of the right breast upper outer quadrant which shows benign breast parenchyma with fibrosis and hemosidern containing macrophages.  Negative for carcinoma. - Mammogram on 10/15/2020, BI-RADS Category 2.  2.  Bone density: -DEXA scan on 05/20/2017 showed T score of -0.6 normal. -Last DEXA scan on 10/10/2019 showed a T score of -1.0.   PLAN:  1.  Stage IIb (T2N1M0) right breast IDC, ER positive/PR negative/HER-2 negative: - Physical examination today  shows right breast scar thickness at the 10 o'clock position which is stable.  No palpable masses or adenopathy. - Reviewed mammogram from 10/15/2020, BI-RADS Category 2. - She did not have labs drawn at this visit.  She was instructed to do labs today. - Continue anastrozole. - She may discontinue port. - RTC 6 months for follow-up with repeat labs.  2.  Bone health: - Continue calcium and vitamin D daily. - We will check vitamin D level.    Breast Cancer therapy associated bone loss: I have recommended calcium, Vitamin D and weight bearing exercises.   Orders Placed This Encounter  Procedures  . CBC with Differential/Platelet    Standing Status:   Future    Standing Expiration Date:   10/22/2021  . Comprehensive metabolic panel    Standing Status:   Future    Standing Expiration Date:   10/22/2021  . Lactate dehydrogenase    Standing Status:   Future    Standing Expiration Date:   10/22/2021  . Vitamin D 25 hydroxy    Standing Status:   Future    Standing Expiration Date:   10/22/2021   The patient has a good understanding of the overall plan. she agrees with it. she will call with any problems that may develop before the next visit here.    Derek Jack, MD [place] [phone]   I, Milinda Antis, am acting as a scribe for Dr. Sanda Linger.  I, Derek Jack MD, have reviewed the above documentation for accuracy and completeness, and I agree with the above.

## 2020-10-31 ENCOUNTER — Ambulatory Visit: Payer: BC Managed Care – PPO

## 2020-10-31 ENCOUNTER — Ambulatory Visit (INDEPENDENT_AMBULATORY_CARE_PROVIDER_SITE_OTHER): Payer: BC Managed Care – PPO | Admitting: Orthopedic Surgery

## 2020-10-31 ENCOUNTER — Encounter: Payer: Self-pay | Admitting: Orthopedic Surgery

## 2020-10-31 ENCOUNTER — Other Ambulatory Visit: Payer: Self-pay

## 2020-10-31 ENCOUNTER — Telehealth: Payer: Self-pay | Admitting: Radiology

## 2020-10-31 VITALS — BP 136/82 | HR 85 | Ht 64.0 in | Wt 210.0 lb

## 2020-10-31 DIAGNOSIS — M170 Bilateral primary osteoarthritis of knee: Secondary | ICD-10-CM | POA: Diagnosis not present

## 2020-10-31 DIAGNOSIS — M25562 Pain in left knee: Secondary | ICD-10-CM

## 2020-10-31 DIAGNOSIS — G8929 Other chronic pain: Secondary | ICD-10-CM

## 2020-10-31 DIAGNOSIS — M1711 Unilateral primary osteoarthritis, right knee: Secondary | ICD-10-CM

## 2020-10-31 MED ORDER — MELOXICAM 7.5 MG PO TABS: 7.5000 mg | ORAL_TABLET | Freq: Every day | ORAL | 5 refills | Status: DC

## 2020-10-31 NOTE — Telephone Encounter (Signed)
-----   Message from Carole Civil, MD sent at 10/31/2020 10:49 AM EDT ----- Check for hyaluronic acid injection approval if its approved we can give it on her next visit in 3 months

## 2020-10-31 NOTE — Progress Notes (Signed)
Chief Complaint  Patient presents with  . Knee Pain    Bilateral     65 year old female bilateral knee pain works a 12-hour job last time we saw her we recommended weight loss knee exercises knee injection bracing but she decided to continue with diclofenac and Ultracet and did not want an injection  She has bilateral knee pain left is actually worse than right in terms of her symptoms its worse with standing no giving way  The patient meets the AMA guidelines for Morbid (severe) obesity with a BMI > 40.0 and I have recommended weight loss.  She does take Voltaren 75 twice a day she says it does not work well  Physical Exam Musculoskeletal:     Comments: Right knee Skin is intact Tenderness medial joint line no effusion Range of motion still normal Ligaments are stable Muscle tone is normal  Left knee Skin is intact Tenderness medial joint line effusion Range of motion still normal Ligaments stable Muscle tone normal    AP and lateral x-rays of both knees osteoarthritis right knee more than medial disease with mild varus compared to the left with normal alignment.  Medial compartment arthrosis  Recommend meloxicam  Return in 3 months if no improvement recommend gel injection  Meds ordered this encounter  Medications  . meloxicam (MOBIC) 7.5 MG tablet    Sig: Take 1 tablet (7.5 mg total) by mouth daily.    Dispense:  30 tablet    Refill:  5

## 2020-10-31 NOTE — Telephone Encounter (Signed)
She has not had a steroid injection yet, she has to have that first, please.

## 2020-10-31 NOTE — Telephone Encounter (Signed)
Does she qualify for hyaluronic acid ?

## 2020-11-14 ENCOUNTER — Encounter: Payer: Self-pay | Admitting: General Surgery

## 2020-11-14 ENCOUNTER — Other Ambulatory Visit: Payer: Self-pay

## 2020-11-14 ENCOUNTER — Telehealth: Payer: Self-pay | Admitting: General Surgery

## 2020-11-14 ENCOUNTER — Ambulatory Visit (INDEPENDENT_AMBULATORY_CARE_PROVIDER_SITE_OTHER): Payer: BC Managed Care – PPO | Admitting: General Surgery

## 2020-11-14 VITALS — BP 131/78 | HR 76 | Temp 98.2°F | Resp 12 | Ht 64.0 in | Wt 210.0 lb

## 2020-11-14 DIAGNOSIS — C50411 Malignant neoplasm of upper-outer quadrant of right female breast: Secondary | ICD-10-CM | POA: Diagnosis not present

## 2020-11-14 DIAGNOSIS — Z853 Personal history of malignant neoplasm of breast: Secondary | ICD-10-CM

## 2020-11-14 NOTE — Progress Notes (Signed)
[name]; [id number]; May 06, 1956   HPI Patient is a 65 year old black female who was referred to my care by Dr. Delton Coombes for Port-A-Cath removal.  She has been undergoing treatment for right breast carcinoma and is finished with her chemotherapy. Past Medical History:  Diagnosis Date  . Arthritis   . Breast cancer of upper-outer quadrant of right female breast ([facility]) 07/14/2016  . Cancer ([place])   . Cough    dry  . Diabetes mellitus without complication ([place])   . Hypertension   . Ruptured eardrum    left     Past Surgical History:  Procedure Laterality Date  . ABDOMINAL HYSTERECTOMY     with bilateral bso  . AXILLARY LYMPH NODE DISSECTION Right 12/30/2016   Procedure: AXILLARY LYMPH NODE DISSECTION;  Surgeon: Aviva Signs, MD;  Location: [facility];  Service: General;  Laterality: Right;  . CATARACT EXTRACTION W/PHACO Left 01/03/2020   Procedure: CATARACT EXTRACTION PHACO AND INTRAOCULAR LENS PLACEMENT LEFT EYE;  Surgeon: Baruch Goldmann, MD;  Location: [facility];  Service: Ophthalmology;  Laterality: Left;  CDE: 5.58  . COLONOSCOPY    . otif wrist Right   . PARTIAL MASTECTOMY WITH NEEDLE LOCALIZATION Right 12/30/2016   Procedure: PARTIAL MASTECTOMY WITH NEEDLE LOCALIZATION;  Surgeon: Aviva Signs, MD;  Location: [facility];  Service: General;  Laterality: Right;  . PORTACATH PLACEMENT Left 07/23/2016   Procedure: INSERTION PORT-A-CATH;  Surgeon: Stark Klein, MD;  Location: [facility];  Service: General;  Laterality: Left;  . RADIOLOGY WITH ANESTHESIA N/A 08/19/2017   Procedure: MRI WITH ANESTHESIA OF BRAIN WITHOUT CONTRAST;  Surgeon: Radiologist, Medication, MD;  Location: [facility];  Service: Radiology;  Laterality: N/A;  . ruptured ear drum Left     Family History  Problem Relation Age of Onset  . Heart disease Mother   . Diabetes Mother   . Liver disease Father   . Stroke Brother     Current Outpatient Medications on File Prior to Visit  Medication Sig Dispense Refill   . amLODipine (NORVASC) 10 MG tablet Take 10 mg by mouth daily.    [name] amoxicillin-clavulanate (AUGMENTIN) 875-125 MG tablet Take 1 tablet by mouth 2 (two) times daily.    [name] anastrozole (ARIMIDEX) 1 MG tablet TAKE 1 TABLET BY MOUTH EVERY DAY 90 tablet 3  . aspirin EC 81 MG tablet Take 81 mg by mouth daily.    [name] atorvastatin (LIPITOR) 10 MG tablet Take 10 mg by mouth daily at 6 PM.   4  . cholecalciferol (VITAMIN D) 1000 units tablet Take 1,000 Units by mouth daily.    . Coral Calcium-Magnesium-Vit D (CORAL CALCIUM PLUS) [phone] MG-MG-UNIT CAPS Take 500 mg by mouth in the morning and at bedtime.    [name] glyBURIDE (DIABETA) 5 MG tablet Take 5 mg by mouth daily with breakfast.    . hydrochlorothiazide (HYDRODIURIL) 25 MG tablet Take 25 mg by mouth daily.    [name] JARDIANCE 10 MG TABS tablet Take 10 mg by mouth daily.    [name] lisinopril (PRINIVIL,ZESTRIL) 40 MG tablet Take 40 mg by mouth daily.    . meloxicam (MOBIC) 7.5 MG tablet Take 1 tablet (7.5 mg total) by mouth daily. 30 tablet 5  . pioglitazone (ACTOS) 30 MG tablet Take 30 mg by mouth daily.      No current facility-administered medications on file prior to visit.    No Known Allergies  Social History   Substance and Sexual Activity  Alcohol Use No  Social History   Tobacco Use  Smoking Status Former Smoker  . Packs/day: 0.50  . Years: 4.00  . Pack years: 2.00  . Types: Cigarettes  Smokeless Tobacco Never Used    Review of Systems  Constitutional: Negative.   HENT: Positive for hearing loss and sinus pain.   Eyes: Negative.   Respiratory: Negative.   Cardiovascular: Negative.   Gastrointestinal: Negative.   Genitourinary: Negative.   Musculoskeletal: Negative.   Skin: Negative.   Neurological: Negative.   Endo/Heme/Allergies: Negative.   Psychiatric/Behavioral: Negative.     Objective   Vitals:   11/14/20 0931  BP: 131/78  Pulse: 76  Resp: 12  Temp: 98.2 F (36.8 C)  SpO2: 96%    Physical Exam Vitals  reviewed.  Constitutional:      Appearance: Normal appearance. She is not ill-appearing.  HENT:     Head: Normocephalic and atraumatic.  Cardiovascular:     Rate and Rhythm: Normal rate and regular rhythm.     Heart sounds: Normal heart sounds. No murmur heard. No friction rub. No gallop.   Pulmonary:     Effort: Pulmonary effort is normal. No respiratory distress.     Breath sounds: Normal breath sounds. No stridor. No wheezing, rhonchi or rales.     Comments: Port-A-Cath in place left upper chest. Skin:    General: Skin is warm and dry.  Neurological:     Mental Status: She is alert and oriented to person, place, and time.    Dr. Tomie China notes reviewed Assessment  Right breast carcinoma, finished with chemotherapy Plan   Patient is scheduled for Port-A-Cath removal in the minor procedure room on 11/22/2020.  The risks and benefits of the procedure were fully explained to the patient, who gave informed consent.

## 2020-11-14 NOTE — H&P (Signed)
[name]; [id number]; May 06, 1956   HPI Patient is a 65 year old black female who was referred to my care by Dr. Delton Coombes for Port-A-Cath removal.  She has been undergoing treatment for right breast carcinoma and is finished with her chemotherapy. Past Medical History:  Diagnosis Date  . Arthritis   . Breast cancer of upper-outer quadrant of right female breast ([facility]) 07/14/2016  . Cancer ([place])   . Cough    dry  . Diabetes mellitus without complication ([facility])   . Hypertension   . Ruptured eardrum    left     Past Surgical History:  Procedure Laterality Date  . ABDOMINAL HYSTERECTOMY     with bilateral bso  . AXILLARY LYMPH NODE DISSECTION Right 12/30/2016   Procedure: AXILLARY LYMPH NODE DISSECTION;  Surgeon: Aviva Signs, MD;  Location: [facility];  Service: General;  Laterality: Right;  . CATARACT EXTRACTION W/PHACO Left 01/03/2020   Procedure: CATARACT EXTRACTION PHACO AND INTRAOCULAR LENS PLACEMENT LEFT EYE;  Surgeon: Baruch Goldmann, MD;  Location: [facility];  Service: Ophthalmology;  Laterality: Left;  CDE: 5.58  . COLONOSCOPY    . otif wrist Right   . PARTIAL MASTECTOMY WITH NEEDLE LOCALIZATION Right 12/30/2016   Procedure: PARTIAL MASTECTOMY WITH NEEDLE LOCALIZATION;  Surgeon: Aviva Signs, MD;  Location: [facility];  Service: General;  Laterality: Right;  . PORTACATH PLACEMENT Left 07/23/2016   Procedure: INSERTION PORT-A-CATH;  Surgeon: Stark Klein, MD;  Location: [facility];  Service: General;  Laterality: Left;  . RADIOLOGY WITH ANESTHESIA N/A 08/19/2017   Procedure: MRI WITH ANESTHESIA OF BRAIN WITHOUT CONTRAST;  Surgeon: Radiologist, Medication, MD;  Location: [facility];  Service: Radiology;  Laterality: N/A;  . ruptured ear drum Left     Family History  Problem Relation Age of Onset  . Heart disease Mother   . Diabetes Mother   . Liver disease Father   . Stroke Brother     Current Outpatient Medications on File Prior to Visit  Medication Sig Dispense Refill   . amLODipine (NORVASC) 10 MG tablet Take 10 mg by mouth daily.    [name] amoxicillin-clavulanate (AUGMENTIN) 875-125 MG tablet Take 1 tablet by mouth 2 (two) times daily.    [name] anastrozole (ARIMIDEX) 1 MG tablet TAKE 1 TABLET BY MOUTH EVERY DAY 90 tablet 3  . aspirin EC 81 MG tablet Take 81 mg by mouth daily.    [name] atorvastatin (LIPITOR) 10 MG tablet Take 10 mg by mouth daily at 6 PM.   4  . cholecalciferol (VITAMIN D) 1000 units tablet Take 1,000 Units by mouth daily.    . Coral Calcium-Magnesium-Vit D (CORAL CALCIUM PLUS) [phone] MG-MG-UNIT CAPS Take 500 mg by mouth in the morning and at bedtime.    [name] glyBURIDE (DIABETA) 5 MG tablet Take 5 mg by mouth daily with breakfast.    . hydrochlorothiazide (HYDRODIURIL) 25 MG tablet Take 25 mg by mouth daily.    [name] JARDIANCE 10 MG TABS tablet Take 10 mg by mouth daily.    [name] lisinopril (PRINIVIL,ZESTRIL) 40 MG tablet Take 40 mg by mouth daily.    . meloxicam (MOBIC) 7.5 MG tablet Take 1 tablet (7.5 mg total) by mouth daily. 30 tablet 5  . pioglitazone (ACTOS) 30 MG tablet Take 30 mg by mouth daily.      No current facility-administered medications on file prior to visit.    No Known Allergies  Social History   Substance and Sexual Activity  Alcohol Use No  Social History   Tobacco Use  Smoking Status Former Smoker  . Packs/day: 0.50  . Years: 4.00  . Pack years: 2.00  . Types: Cigarettes  Smokeless Tobacco Never Used    Review of Systems  Constitutional: Negative.   HENT: Positive for hearing loss and sinus pain.   Eyes: Negative.   Respiratory: Negative.   Cardiovascular: Negative.   Gastrointestinal: Negative.   Genitourinary: Negative.   Musculoskeletal: Negative.   Skin: Negative.   Neurological: Negative.   Endo/Heme/Allergies: Negative.   Psychiatric/Behavioral: Negative.     Objective   Vitals:   11/14/20 0931  BP: 131/78  Pulse: 76  Resp: 12  Temp: 98.2 F (36.8 C)  SpO2: 96%    Physical Exam Vitals  reviewed.  Constitutional:      Appearance: Normal appearance. She is not ill-appearing.  HENT:     Head: Normocephalic and atraumatic.  Cardiovascular:     Rate and Rhythm: Normal rate and regular rhythm.     Heart sounds: Normal heart sounds. No murmur heard. No friction rub. No gallop.   Pulmonary:     Effort: Pulmonary effort is normal. No respiratory distress.     Breath sounds: Normal breath sounds. No stridor. No wheezing, rhonchi or rales.     Comments: Port-A-Cath in place left upper chest. Skin:    General: Skin is warm and dry.  Neurological:     Mental Status: She is alert and oriented to person, place, and time.    Dr. Tomie China notes reviewed Assessment  Right breast carcinoma, finished with chemotherapy Plan   Patient is scheduled for Port-A-Cath removal in the minor procedure room on 11/22/2020.  The risks and benefits of the procedure were fully explained to the patient, who gave informed consent.

## 2020-11-14 NOTE — Telephone Encounter (Signed)
Left a message for patient to return call to discuss instructions for surgical procedure on 11-22-2020.

## 2020-11-18 NOTE — Telephone Encounter (Signed)
Patient has been notified of surgery date and arrival time on day of procedure.

## 2020-11-22 ENCOUNTER — Encounter (HOSPITAL_COMMUNITY)
Admission: RE | Disposition: A | Discharge status: Home / Self Care | Payer: Self-pay | Source: Home / Self Care | Attending: General Surgery

## 2020-11-22 ENCOUNTER — Ambulatory Visit (HOSPITAL_COMMUNITY)
Admission: RE | Admit: 2020-11-22 | Discharge: 2020-11-22 | Disposition: A | Discharge status: Home / Self Care | Payer: BC Managed Care – PPO | Attending: General Surgery | Admitting: General Surgery

## 2020-11-22 DIAGNOSIS — Z853 Personal history of malignant neoplasm of breast: Secondary | ICD-10-CM

## 2020-11-22 DIAGNOSIS — Z9221 Personal history of antineoplastic chemotherapy: Secondary | ICD-10-CM | POA: Insufficient documentation

## 2020-11-22 DIAGNOSIS — Z823 Family history of stroke: Secondary | ICD-10-CM | POA: Insufficient documentation

## 2020-11-22 DIAGNOSIS — Z79899 Other long term (current) drug therapy: Secondary | ICD-10-CM | POA: Insufficient documentation

## 2020-11-22 DIAGNOSIS — Z791 Long term (current) use of non-steroidal anti-inflammatories (NSAID): Secondary | ICD-10-CM | POA: Diagnosis not present

## 2020-11-22 DIAGNOSIS — Z7982 Long term (current) use of aspirin: Secondary | ICD-10-CM | POA: Insufficient documentation

## 2020-11-22 DIAGNOSIS — Z833 Family history of diabetes mellitus: Secondary | ICD-10-CM | POA: Diagnosis not present

## 2020-11-22 DIAGNOSIS — Z8249 Family history of ischemic heart disease and other diseases of the circulatory system: Secondary | ICD-10-CM | POA: Diagnosis not present

## 2020-11-22 DIAGNOSIS — I10 Essential (primary) hypertension: Secondary | ICD-10-CM | POA: Insufficient documentation

## 2020-11-22 DIAGNOSIS — Z87891 Personal history of nicotine dependence: Secondary | ICD-10-CM | POA: Diagnosis not present

## 2020-11-22 DIAGNOSIS — E119 Type 2 diabetes mellitus without complications: Secondary | ICD-10-CM | POA: Insufficient documentation

## 2020-11-22 DIAGNOSIS — Z452 Encounter for adjustment and management of vascular access device: Secondary | ICD-10-CM | POA: Diagnosis present

## 2020-11-22 DIAGNOSIS — Z7984 Long term (current) use of oral hypoglycemic drugs: Secondary | ICD-10-CM | POA: Diagnosis not present

## 2020-11-22 HISTORY — PX: PORT-A-CATH REMOVAL: SHX5289

## 2020-11-22 SURGERY — MINOR REMOVAL PORT-A-CATH
Anesthesia: LOCAL | Laterality: Left

## 2020-11-22 MED ORDER — LIDOCAINE HCL (PF) 1 % IJ SOLN
INTRAMUSCULAR | Status: AC
  Filled 2020-11-22: qty 30

## 2020-11-22 SURGICAL SUPPLY — 22 items
ADH SKN CLS APL DERMABOND .7 (GAUZE/BANDAGES/DRESSINGS) ×1
APL PRP STRL LF ISPRP CHG 10.5 (MISCELLANEOUS) ×1
APPLICATOR CHLORAPREP 10.5 ORG (MISCELLANEOUS) ×2 IMPLANT
CLOTH BEACON ORANGE TIMEOUT ST (SAFETY) ×2 IMPLANT
DECANTER SPIKE VIAL GLASS SM (MISCELLANEOUS) ×2 IMPLANT
DERMABOND ADVANCED (GAUZE/BANDAGES/DRESSINGS) ×1
DERMABOND ADVANCED .7 DNX12 (GAUZE/BANDAGES/DRESSINGS) ×1 IMPLANT
DRAPE HALF SHEET 40X57 (DRAPES) IMPLANT
ELECT REM PT RETURN 9FT ADLT (ELECTROSURGICAL) ×2
ELECTRODE REM PT RTRN 9FT ADLT (ELECTROSURGICAL) ×1 IMPLANT
GLOVE SURG SS PI 7.5 STRL IVOR (GLOVE) ×3 IMPLANT
GLOVE SURG UNDER POLY LF SZ7 (GLOVE) IMPLANT
GOWN STRL REUS W/TWL LRG LVL3 (GOWN DISPOSABLE) IMPLANT
NDL HYPO 25X1 1.5 SAFETY (NEEDLE) ×1 IMPLANT
NEEDLE HYPO 25X1 1.5 SAFETY (NEEDLE) ×2 IMPLANT
PENCIL SMOKE EVACUATOR COATED (MISCELLANEOUS) IMPLANT
SPONGE GAUZE 2X2 8PLY STRL LF (GAUZE/BANDAGES/DRESSINGS) ×2 IMPLANT
SUT MNCRL AB 4-0 PS2 18 (SUTURE) ×2 IMPLANT
SUT VIC AB 3-0 SH 27 (SUTURE) ×2
SUT VIC AB 3-0 SH 27X BRD (SUTURE) ×1 IMPLANT
SYR CONTROL 10ML LL (SYRINGE) ×2 IMPLANT
TOWEL OR 17X26 4PK STRL BLUE (TOWEL DISPOSABLE) ×2 IMPLANT

## 2020-11-22 NOTE — Discharge Instructions (Signed)
Implanted Port Removal, Care After °This sheet gives you information about how to care for yourself after your procedure. Your health care provider may also give you more specific instructions. If you have problems or questions, contact your health care provider. °What can I expect after the procedure? °After the procedure, it is common to have: °· Soreness or pain near your incision. °· Some swelling or bruising near your incision. °Follow these instructions at home: °Medicines °· Take over-the-counter and prescription medicines only as told by your health care provider. °· If you were prescribed an antibiotic medicine, take it as told by your health care provider. Do not stop taking the antibiotic even if you start to feel better. °Bathing °· Do not take baths, swim, or use a hot tub until your health care provider approves. Ask your health care provider if you can take showers. You may only be allowed to take sponge baths. °Incision care °· Follow instructions from your health care provider about how to take care of your incision. Make sure you: °? Wash your hands with soap and water before you change your bandage (dressing). If soap and water are not available, use hand sanitizer. °? Change your dressing as told by your health care provider. °? Keep your dressing dry. °? Leave stitches (sutures), skin glue, or adhesive strips in place. These skin closures may need to stay in place for 2 weeks or longer. If adhesive strip edges start to loosen and curl up, you may trim the loose edges. Do not remove adhesive strips completely unless your health care provider tells you to do that. °· Check your incision area every day for signs of infection. Check for: °? More redness, swelling, or pain. °? More fluid or blood. °? Warmth. °? Pus or a bad smell.   °Driving °· Do not drive for 24 hours if you were given a medicine to help you relax (sedative) during your procedure. °· If you did not receive a sedative, ask your  health care provider when it is safe to drive.   °Activity °· Return to your normal activities as told by your health care provider. Ask your health care provider what activities are safe for you. °· Do not lift anything that is heavier than 10 lb (4.5 kg), or the limit that you are told, until your health care provider says that it is safe. °· Do not do activities that involve lifting your arms over your head. °General instructions °· Do not use any products that contain nicotine or tobacco, such as cigarettes and e-cigarettes. These can delay healing. If you need help quitting, ask your health care provider. °· Keep all follow-up visits as told by your health care provider. This is important. °Contact a health care provider if: °· You have more redness, swelling, or pain around your incision. °· You have more fluid or blood coming from your incision. °· Your incision feels warm to the touch. °· You have pus or a bad smell coming from your incision. °· You have pain that is not relieved by your pain medicine. °Get help right away if you have: °· A fever or chills. °· Chest pain. °· Difficulty breathing. °Summary °· After the procedure, it is common to have pain, soreness, swelling, or bruising near your incision. °· If you were prescribed an antibiotic medicine, take it as told by your health care provider. Do not stop taking the antibiotic even if you start to feel better. °· Do not drive for   24 hours if you were given a sedative during your procedure. °· Return to your normal activities as told by your health care provider. Ask your health care provider what activities are safe for you. °This information is not intended to replace advice given to you by your health care provider. Make sure you discuss any questions you have with your health care provider. °Document Revised: 08/05/2017 Document Reviewed: 08/05/2017 °Elsevier Patient Education © 2021 [employer] ° °

## 2020-11-22 NOTE — Interval H&P Note (Signed)
History and Physical Interval Note:  11/22/2020 8:16 AM  [name]  has presented today for surgery, with the diagnosis of Right Breast Cancer.  The various methods of treatment have been discussed with the patient and family. After consideration of risks, benefits and other options for treatment, the patient has consented to  Procedure(s) with comments: MINOR REMOVAL PORT-A-CATH (Left) - pt to be done at 9:15 as a surgical intervention.  The patient's history has been reviewed, patient examined, no change in status, stable for surgery.  I have reviewed the patient's chart and labs.  Questions were answered to the patient's satisfaction.     Aviva Signs

## 2020-11-22 NOTE — Op Note (Signed)
Patient:  [name]  DOB:  10-07-1955  MRN:  [id number]   Preop Diagnosis: Right breast carcinoma, finished with chemotherapy  Postop Diagnosis: Same  Procedure: Port-A-Cath removal  Surgeon: Aviva Signs, MD  Anes: Local  Indications: Patient is a 65 year old black female who has finished chemotherapy for right breast carcinoma.  She now presents for Port-A-Cath removal.  The risks and benefits of the procedure including bleeding and infection were fully explained to the patient, who gave informed consent.  Procedure note: The patient was placed in supine position.  After the left upper chest was prepped and draped using the usual sterile technique with ChloraPrep,  1% Xylocaine was used for local anesthesia.  Surgical site confirmation was performed.  An incision was made through the previous surgical incision site.  This was taken down to the port.  The port was removed in total without difficulty.  It was disposed of.  The subcutaneous layer was reapproximated using a 3-0 Vicryl interrupted suture.  The skin was closed using a 4-0 Monocryl subcuticular suture.  Dermabond was applied.  The patient tolerated the procedure well and was discharged from the minor procedure room.  Complications: None  EBL: Minimal  Specimen: None

## 2020-11-25 ENCOUNTER — Encounter (HOSPITAL_COMMUNITY): Payer: Self-pay | Admitting: General Surgery

## 2021-01-30 ENCOUNTER — Ambulatory Visit (INDEPENDENT_AMBULATORY_CARE_PROVIDER_SITE_OTHER): Payer: BC Managed Care – PPO | Admitting: Orthopedic Surgery

## 2021-01-30 ENCOUNTER — Other Ambulatory Visit: Payer: Self-pay

## 2021-01-30 ENCOUNTER — Encounter: Payer: Self-pay | Admitting: Orthopedic Surgery

## 2021-01-30 VITALS — BP 135/77 | HR 90 | Ht 64.0 in | Wt 212.0 lb

## 2021-01-30 DIAGNOSIS — M1711 Unilateral primary osteoarthritis, right knee: Secondary | ICD-10-CM | POA: Diagnosis not present

## 2021-01-30 DIAGNOSIS — M25562 Pain in left knee: Secondary | ICD-10-CM

## 2021-01-30 DIAGNOSIS — G8929 Other chronic pain: Secondary | ICD-10-CM | POA: Diagnosis not present

## 2021-01-30 NOTE — Patient Instructions (Signed)
WEIGHT KEEP IT DOWN

## 2021-01-30 NOTE — Progress Notes (Signed)
FOLLOW UP   Encounter Diagnoses  Name Primary?   Primary osteoarthritis of right knee Yes   Chronic pain of left knee      Chief Complaint  Patient presents with   Knee Pain    Bilateral     BMI IS 36    Current Outpatient Medications:    acetaminophen (TYLENOL) 650 MG CR tablet, Take 1,300 mg by mouth every 8 (eight) hours as needed (pain)., Disp: , Rfl:    amLODipine (NORVASC) 10 MG tablet, Take 10 mg by mouth in the morning., Disp: , Rfl:    amoxicillin-clavulanate (AUGMENTIN) 875-125 MG tablet, Take 1 tablet by mouth 2 (two) times daily., Disp: , Rfl:    anastrozole (ARIMIDEX) 1 MG tablet, TAKE 1 TABLET BY MOUTH EVERY DAY (Patient taking differently: Take 1 mg by mouth in the morning.), Disp: 90 tablet, Rfl: 3   aspirin EC 81 MG tablet, Take 81 mg by mouth in the morning., Disp: , Rfl:    atorvastatin (LIPITOR) 10 MG tablet, Take 10 mg by mouth daily in the afternoon., Disp: , Rfl: 4   Calcium-Phosphorus-Vitamin D (CALCIUM GUMMIES PO), Take 1 tablet by mouth in the morning and at bedtime., Disp: , Rfl:    cholecalciferol (VITAMIN D) 1000 units tablet, Take 1,000 Units by mouth daily in the afternoon., Disp: , Rfl:    glyBURIDE (DIABETA) 5 MG tablet, Take 5 mg by mouth in the morning., Disp: , Rfl:    hydrochlorothiazide (HYDRODIURIL) 25 MG tablet, Take 25 mg by mouth in the morning., Disp: , Rfl:    lisinopril (PRINIVIL,ZESTRIL) 40 MG tablet, Take 40 mg by mouth in the morning., Disp: , Rfl:    meloxicam (MOBIC) 7.5 MG tablet, Take 1 tablet (7.5 mg total) by mouth daily. (Patient taking differently: Take 7.5 mg by mouth in the morning.), Disp: 30 tablet, Rfl: 5   pioglitazone (ACTOS) 30 MG tablet, Take 30 mg by mouth in the morning., Disp: , Rfl:    JARDIANCE 10 MG TABS tablet, Take 10 mg by mouth in the morning. (Patient not taking: Reported on 01/30/2021), Disp: , Rfl:   PMH: DM , HTN   Ms [name] THAT THE LEFT KNEE IS OK BUT HURTS AND SWELLS AFTER 12 HR SHIFT WHICH  REQUIRES FREQUENT STAIR CLIMBING   SO I OFFERED HER INJECTIONS BUT SHE DECLINED AND WOULD RATHER TAKE MOTRIN   Physical Exam Constitutional:      General: She is not in acute distress.    Appearance: She is well-developed.     Comments: Well developed, well nourished Normal grooming and hygiene     Cardiovascular:     Comments: No peripheral edema Musculoskeletal:     Left knee: Swelling, deformity and effusion present. No erythema, ecchymosis or lacerations. Decreased range of motion. Tenderness present over the medial joint line. No ACL laxity or PCL laxity. Skin:    General: Skin is warm and dry.  Neurological:     Mental Status: She is alert and oriented to person, place, and time.     Sensory: No sensory deficit.     Coordination: Coordination normal.     Gait: Gait normal.     Deep Tendon Reflexes: Reflexes are normal and symmetric.  Psychiatric:        Mood and Affect: Mood normal.        Behavior: Behavior normal.        Thought Content: Thought content normal.  Judgment: Judgment normal.     Comments: Affect normal

## 2021-04-16 ENCOUNTER — Other Ambulatory Visit: Payer: Self-pay | Admitting: Orthopedic Surgery

## 2021-04-16 DIAGNOSIS — M1711 Unilateral primary osteoarthritis, right knee: Secondary | ICD-10-CM

## 2021-04-16 DIAGNOSIS — G8929 Other chronic pain: Secondary | ICD-10-CM

## 2021-04-16 DIAGNOSIS — M25562 Pain in left knee: Secondary | ICD-10-CM

## 2021-04-23 ENCOUNTER — Other Ambulatory Visit: Payer: Self-pay

## 2021-04-23 ENCOUNTER — Inpatient Hospital Stay (HOSPITAL_COMMUNITY): Payer: BC Managed Care – PPO | Attending: Hematology

## 2021-04-23 DIAGNOSIS — Z170 Estrogen receptor positive status [ER+]: Secondary | ICD-10-CM | POA: Diagnosis not present

## 2021-04-23 DIAGNOSIS — Z7982 Long term (current) use of aspirin: Secondary | ICD-10-CM | POA: Insufficient documentation

## 2021-04-23 DIAGNOSIS — Z9221 Personal history of antineoplastic chemotherapy: Secondary | ICD-10-CM | POA: Insufficient documentation

## 2021-04-23 DIAGNOSIS — C50411 Malignant neoplasm of upper-outer quadrant of right female breast: Secondary | ICD-10-CM | POA: Diagnosis not present

## 2021-04-23 DIAGNOSIS — R7989 Other specified abnormal findings of blood chemistry: Secondary | ICD-10-CM

## 2021-04-23 DIAGNOSIS — Z7984 Long term (current) use of oral hypoglycemic drugs: Secondary | ICD-10-CM | POA: Insufficient documentation

## 2021-04-23 DIAGNOSIS — Z79899 Other long term (current) drug therapy: Secondary | ICD-10-CM | POA: Insufficient documentation

## 2021-04-23 DIAGNOSIS — Z79811 Long term (current) use of aromatase inhibitors: Secondary | ICD-10-CM | POA: Diagnosis not present

## 2021-04-23 LAB — CBC WITH DIFFERENTIAL/PLATELET
Abs Immature Granulocytes: 0.01 10*3/uL (ref 0.00–0.07)
Basophils Absolute: 0 10*3/uL (ref 0.0–0.1)
Basophils Relative: 1 %
Eosinophils Absolute: 0.1 10*3/uL (ref 0.0–0.5)
Eosinophils Relative: 2 %
HCT: 37.7 % (ref 36.0–46.0)
Hemoglobin: 12.2 g/dL (ref 12.0–15.0)
Immature Granulocytes: 0 %
Lymphocytes Relative: 34 %
Lymphs Abs: 1.6 10*3/uL (ref 0.7–4.0)
MCH: 28.3 pg (ref 26.0–34.0)
MCHC: 32.4 g/dL (ref 30.0–36.0)
MCV: 87.5 fL (ref 80.0–100.0)
Monocytes Absolute: 0.4 10*3/uL (ref 0.1–1.0)
Monocytes Relative: 8 %
Neutro Abs: 2.7 10*3/uL (ref 1.7–7.7)
Neutrophils Relative %: 55 %
Platelets: 185 10*3/uL (ref 150–400)
RBC: 4.31 MIL/uL (ref 3.87–5.11)
RDW: 13.6 % (ref 11.5–15.5)
WBC: 4.8 10*3/uL (ref 4.0–10.5)
nRBC: 0 % (ref 0.0–0.2)

## 2021-04-23 LAB — LACTATE DEHYDROGENASE: LDH: 175 U/L (ref 98–192)

## 2021-04-23 LAB — COMPREHENSIVE METABOLIC PANEL
ALT: 17 U/L (ref 0–44)
AST: 17 U/L (ref 15–41)
Albumin: 3.6 g/dL (ref 3.5–5.0)
Alkaline Phosphatase: 109 U/L (ref 38–126)
Anion gap: 8 (ref 5–15)
BUN: 25 mg/dL — ABNORMAL HIGH (ref 8–23)
CO2: 24 mmol/L (ref 22–32)
Calcium: 9.2 mg/dL (ref 8.9–10.3)
Chloride: 102 mmol/L (ref 98–111)
Creatinine, Ser: 1.3 mg/dL — ABNORMAL HIGH (ref 0.44–1.00)
GFR, Estimated: 46 mL/min — ABNORMAL LOW (ref >60)
Glucose, Bld: 171 mg/dL — ABNORMAL HIGH (ref 70–99)
Potassium: 3.3 mmol/L — ABNORMAL LOW (ref 3.5–5.1)
Sodium: 134 mmol/L — ABNORMAL LOW (ref 135–145)
Total Bilirubin: 0.8 mg/dL (ref 0.3–1.2)
Total Protein: 8 g/dL (ref 6.5–8.1)

## 2021-04-23 LAB — FOLATE: Folate: 13.5 ng/mL (ref >5.9)

## 2021-04-23 LAB — VITAMIN B12: Vitamin B-12: 871 pg/mL (ref 180–914)

## 2021-04-23 LAB — VITAMIN D 25 HYDROXY (VIT D DEFICIENCY, FRACTURES): Vit D, 25-Hydroxy: 46.06 ng/mL (ref 30–100)

## 2021-04-29 NOTE — Progress Notes (Signed)
 [facility] [address] [place], [place] [address]   Patient Care Team: Shah, Ashish, MD as PCP - General (Internal Medicine)  SUMMARY OF ONCOLOGIC HISTORY: Oncology History  Breast cancer of upper-outer quadrant of right female breast (HCC)  06/24/2016 Mammogram   Diagnostic bilateral mammogram: highly suspicious 3.5 cm mass in the UO R breast with marked enlarged R axillary LN   06/24/2016 Breast US   2.7 x 3.1 x 3.5 cm slightly irregular hypoechoic mass at the 10:00 position of right breast, 6 cm from the nipple.  4.1 x 6 cm right axillar lymph node also identified.   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   07/21/2016 Imaging   CT CAP- 2.9 cm right upper outer quadrant breast mass, consistent with known primary breast carcinoma.   Right axillary and subpectoral lymphadenopathy, consistent with metastatic disease.   Tiny sub-cm bilateral pulmonary nodules are indeterminate, but favor postinflammatory etiology over early metastases. Recommend continued attention on follow-up CT.   No evidence of metastatic disease within the abdomen or pelvis.   07/23/2016 Procedure   Port placed by Dr. Byerly   07/24/2016 - 12/04/2016 Neo-Adjuvant Chemotherapy   Adriamycin/Cytoxan x 4, followed by weekly Taxol x 12    07/30/2016 Imaging   Bone scan: Negative for osseous disease .   09/22/2016 Imaging   CT head- 1. Normal CT of the brain. No evidence of intracranial metastatic disease. 2. Frothy secretions throughout most of the paranasal sinuses. Correlate clinically for acute sinusitis. This could serve as a source of headache.   12/30/2016 Surgery   Right lumpectomy with axillary lymph  node dissection by Dr. Jenkins. Surgical path: 1. Breast, lumpectomy, right - FIBROCYSTIC AND FIBROADENOMATOID CHANGE. - USUAL DUCTAL HYPERPLASIA. - TREATMENT EFFECT. - NO RESIDUAL CARCINOMA IDENTIFIED. - SEE ONCOLOGY TABLE. 2. Lymph nodes, regional resection, right axillary contents - NINE OF NINE LYMPH NODES NEGATIVE FOR CARCINOMA (0/9). - TREATMENT EFFECT. Final stage ypT0 ypN0     CHIEF COMPLIANT: Follow-up for right breast cancer   INTERVAL HISTORY: [name] is a 65 y.o. female here today for follow up of her right breast cancer. Her last visit was on 10/22/2020.   Today she reports feeling good. She is taking anastrozole and tolerating it well. She reports hot flashes starting 1 week ago which is helped by drinking cold water. She reports continued knee pain but denies any new pains.   REVIEW OF SYSTEMS:   Review of Systems  Constitutional:  Negative for appetite change and fatigue (60%).  Respiratory:  Positive for cough and shortness of breath.   Gastrointestinal:  Positive for constipation.  Endocrine: Positive for hot flashes.  Musculoskeletal:  Positive for arthralgias (knee).  Neurological:  Positive for numbness.  Psychiatric/Behavioral:  Positive for sleep disturbance.   All other systems reviewed and are negative.  I have reviewed the past medical history, past surgical history, social history and family history with the patient and they are unchanged from previous note.   ALLERGIES:   has No Known Allergies.   MEDICATIONS:  Current Outpatient Medications  Medication Sig Dispense Refill   acetaminophen (TYLENOL) 650 MG CR tablet Take 1,300 mg by mouth   every 8 (eight) hours as needed (pain).     amLODipine (NORVASC) 10 MG tablet Take 10 mg by mouth in the morning.     amoxicillin-clavulanate (AUGMENTIN) 875-125 MG tablet Take 1 tablet by mouth 2 (two) times daily.     anastrozole (ARIMIDEX) 1 MG tablet TAKE 1 TABLET BY MOUTH EVERY DAY (Patient  taking differently: Take 1 mg by mouth in the morning.) 90 tablet 3   aspirin EC 81 MG tablet Take 81 mg by mouth in the morning.     atorvastatin (LIPITOR) 10 MG tablet Take 10 mg by mouth daily in the afternoon.  4   Calcium-Phosphorus-Vitamin D (CALCIUM GUMMIES PO) Take 1 tablet by mouth in the morning and at bedtime.     cholecalciferol (VITAMIN D) 1000 units tablet Take 1,000 Units by mouth daily in the afternoon.     glyBURIDE (DIABETA) 5 MG tablet Take 5 mg by mouth in the morning.     hydrochlorothiazide (HYDRODIURIL) 25 MG tablet Take 25 mg by mouth in the morning.     JARDIANCE 10 MG TABS tablet Take 10 mg by mouth in the morning. (Patient not taking: Reported on 01/30/2021)     lisinopril (PRINIVIL,ZESTRIL) 40 MG tablet Take 40 mg by mouth in the morning.     meloxicam (MOBIC) 7.5 MG tablet Take 1 tablet (7.5 mg total) by mouth in the morning. 30 tablet 5   pioglitazone (ACTOS) 30 MG tablet Take 30 mg by mouth in the morning.     No current facility-administered medications for this visit.     PHYSICAL EXAMINATION: Performance status (ECOG): 1 - Symptomatic but completely ambulatory  There were no vitals filed for this visit. Wt Readings from Last 3 Encounters:  01/30/21 212 lb (96.2 kg)  11/14/20 210 lb (95.3 kg)  10/31/20 210 lb (95.3 kg)   Physical Exam Vitals reviewed.  Constitutional:      Appearance: Normal appearance.  Cardiovascular:     Rate and Rhythm: Normal rate and regular rhythm.     Pulses: Normal pulses.     Heart sounds: Normal heart sounds.  Pulmonary:     Effort: Pulmonary effort is normal.     Breath sounds: Normal breath sounds.  Chest:  Breasts:    Right: Normal. No mass, skin change (lumpectomy scar UOQ around lateral areola within normal limits) or tenderness.     Left: Normal. No mass, skin change or tenderness.  Abdominal:     Palpations: Abdomen is soft. There is no hepatomegaly, splenomegaly or mass.     Tenderness: There is no abdominal  tenderness.  Lymphadenopathy:     Upper Body:     Right upper body: No supraclavicular, axillary or pectoral adenopathy.     Left upper body: No supraclavicular, axillary or pectoral adenopathy.  Neurological:     General: No focal deficit present.     Mental Status: She is alert and oriented to person, place, and time.  Psychiatric:        Mood and Affect: Mood normal.        Behavior: Behavior normal.    Breast Exam Chaperone: Thana Ates     LABORATORY DATA:  I have reviewed the data as listed CMP Latest Ref Rng & Units 04/23/2021 05/17/2020 01/01/2020  Glucose 70 - 99 mg/dL 171(H) 142(H) 179(H)  BUN 8 - 23 mg/dL 25(H) 44(H) 35(H)  Creatinine 0.44 - 1.00 mg/dL 1.30(H) 1.48(H) 1.40(H)  Sodium 135 - 145 mmol/L 134(L) 137 139  Potassium  3.5 - 5.1 mmol/L 3.3(L) 3.5 3.7  Chloride 98 - 111 mmol/L 102 102 104  CO2 22 - 32 mmol/L _0 Calcium 8.9 - 10.3 mg/dL 9.2 9.4 9.6  Total Protein 6.5 - 8.1 g/dL 8.0 7.7 -  Total Bilirubin 0.3 - 1.2 mg/dL 0.8 0.7 -  Alkaline Phos 38 - 126 U/L 109 94 -  AST 15 - 41 U/L 17 18 -  ALT 0 - 44 U/L 17 23 -   Lab Results  Component Value Date   CAN153 22.5 09/27/2018   CAN153 20.1 05/06/2018   Lab Results  Component Value Date   WBC 4.8 04/23/2021   HGB 12.2 04/23/2021   HCT 37.7 04/23/2021   MCV 87.5 04/23/2021   PLT 185 04/23/2021   NEUTROABS 2.7 04/23/2021    ASSESSMENT:  1.  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 -Neoadjuvant AC x4 followed by Taxol x12 from 07/24/2016 through 12/04/2016. -Status post right lumpectomy, L&D on 12/30/2016 with PCR, YPT0, YPN0. -Anastrozole was started in August 2018. -She had a mammogram on 09/27/2018 that was BI-RADS Category 4 suspicious.  Recommended stereotactic biopsy of the superior aspect of the lumpectomy site of increasing distortion and density. -Biopsy on 10/06/2018 of the right breast upper outer  quadrant which shows benign breast parenchyma with fibrosis and hemosidern containing macrophages.  Negative for carcinoma. - Mammogram on 10/15/2020, BI-RADS Category 2.   2.  Bone density: -DEXA scan on 05/20/2017 showed T score of -0.6 normal. -Last DEXA scan on 10/10/2019 showed a T score of -1.0.   PLAN:  1.  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  Right lumpectomy scar around areola is also within normal limits.  No palpable masses in bilateral breast.  No palpable adenopathy. - She is tolerating anastrozole reasonably well with occasional hot flashes.  No musculoskeletal symptoms. - Reviewed labs from 04/23/2021 which showed normal LFTs and CBC. - She had mammogram on 10/15/2020 which was BI-RADS Category 2.  We will schedule her for next mammogram in April 2023.  I will see her back in 6 months with repeat labs.   2.  Bone health: - Continue calcium and vitamin D daily. - Vitamin D level is normal.  Breast Cancer therapy associated bone loss: I have recommended calcium, Vitamin D and weight bearing exercises.  Orders placed this encounter:  No orders of the defined types were placed in this encounter.   The patient has a good understanding of the overall plan. She agrees with it. She will call with any problems that may develop before the next visit here.  Derek Jack, MD [facility] [phone]   I, Thana Ates, am acting as a scribe for Dr. Derek Jack.  I, Derek Jack MD, have reviewed the above documentation for accuracy and completeness, and I agree with the above.

## 2021-04-30 ENCOUNTER — Inpatient Hospital Stay (HOSPITAL_BASED_OUTPATIENT_CLINIC_OR_DEPARTMENT_OTHER): Payer: BC Managed Care – PPO | Admitting: Hematology

## 2021-04-30 ENCOUNTER — Other Ambulatory Visit (HOSPITAL_COMMUNITY): Payer: Self-pay | Admitting: Hematology

## 2021-04-30 ENCOUNTER — Other Ambulatory Visit: Payer: Self-pay

## 2021-04-30 VITALS — BP 121/76 | HR 86 | Temp 98.2°F | Resp 18 | Wt 211.0 lb

## 2021-04-30 DIAGNOSIS — Z170 Estrogen receptor positive status [ER+]: Secondary | ICD-10-CM

## 2021-04-30 DIAGNOSIS — C50411 Malignant neoplasm of upper-outer quadrant of right female breast: Secondary | ICD-10-CM

## 2021-04-30 NOTE — Patient Instructions (Addendum)
[place] at [facility] Discharge Instructions  You were seen and examined today by Dr. Delton Coombes. Continue taking calcium and Vitamin D over the counter twice daily. He reviewed your most recent labs and everything looks good your potassium is slightly low and kidney function is stable. Your most recent mammogram in April 2022 looked good.He wants you to stay well hydrated and drink a lot of water to help with your kidneys. Please follow up as scheduled.   Thank you for choosing [place] at [facility] to provide your oncology and hematology care.  To afford each patient quality time with our provider, please arrive at least 15 minutes before your scheduled appointment time.   If you have a lab appointment with the [facility] please come in thru the Main Entrance and check in at the main information desk.  You need to re-schedule your appointment should you arrive 10 or more minutes late.  We strive to give you quality time with our providers, and arriving late affects you and other patients whose appointments are after yours.  Also, if you no show three or more times for appointments you may be dismissed from the clinic at the providers discretion.     Again, thank you for choosing [facility].  Our hope is that these requests will decrease the amount of time that you wait before being seen by our physicians.       _____________________________________________________________  Should you have questions after your visit to [facility], please contact our office at [phone] and follow the prompts.  Our office hours are 8:00 a.m. and 4:30 p.m. Monday - Friday.  Please note that voicemails left after 4:00 p.m. may not be returned until the following business day.  We are closed weekends and major holidays.  You do have access to a nurse 24-7, just call the main number to the clinic [phone] and do not press any  options, hold on the line and a nurse will answer the phone.    For prescription refill requests, have your pharmacy contact our office and allow 72 hours.    Due to Covid, you will need to wear a mask upon entering the hospital. If you do not have a mask, a mask will be given to you at the Main Entrance upon arrival. For doctor visits, patients may have 1 support person age 66 or older with them. For treatment visits, patients can not have anyone with them due to social distancing guidelines and our immunocompromised population.

## 2021-06-19 ENCOUNTER — Other Ambulatory Visit (HOSPITAL_COMMUNITY): Payer: Self-pay | Admitting: Hematology

## 2021-07-05 ENCOUNTER — Other Ambulatory Visit: Payer: Self-pay | Admitting: Orthopedic Surgery

## 2021-07-05 DIAGNOSIS — M25562 Pain in left knee: Secondary | ICD-10-CM

## 2021-07-05 DIAGNOSIS — M25462 Effusion, left knee: Secondary | ICD-10-CM

## 2021-07-05 DIAGNOSIS — M1711 Unilateral primary osteoarthritis, right knee: Secondary | ICD-10-CM

## 2021-09-22 ENCOUNTER — Other Ambulatory Visit: Payer: Self-pay | Admitting: Orthopedic Surgery

## 2021-09-22 DIAGNOSIS — G8929 Other chronic pain: Secondary | ICD-10-CM

## 2021-09-22 DIAGNOSIS — M1711 Unilateral primary osteoarthritis, right knee: Secondary | ICD-10-CM

## 2021-10-21 ENCOUNTER — Ambulatory Visit (HOSPITAL_COMMUNITY)
Admission: RE | Admit: 2021-10-21 | Discharge: 2021-10-21 | Disposition: A | Discharge status: Home / Self Care | Payer: BC Managed Care – PPO | Source: Ambulatory Visit | Attending: Hematology | Admitting: Hematology

## 2021-10-21 ENCOUNTER — Other Ambulatory Visit (HOSPITAL_COMMUNITY): Payer: BC Managed Care – PPO

## 2021-10-21 ENCOUNTER — Inpatient Hospital Stay (HOSPITAL_COMMUNITY): Payer: BC Managed Care – PPO | Attending: Hematology

## 2021-10-21 DIAGNOSIS — Z79811 Long term (current) use of aromatase inhibitors: Secondary | ICD-10-CM | POA: Insufficient documentation

## 2021-10-21 DIAGNOSIS — Z170 Estrogen receptor positive status [ER+]: Secondary | ICD-10-CM | POA: Diagnosis present

## 2021-10-21 DIAGNOSIS — Z79899 Other long term (current) drug therapy: Secondary | ICD-10-CM | POA: Diagnosis not present

## 2021-10-21 DIAGNOSIS — C50411 Malignant neoplasm of upper-outer quadrant of right female breast: Secondary | ICD-10-CM | POA: Insufficient documentation

## 2021-10-21 DIAGNOSIS — Z7984 Long term (current) use of oral hypoglycemic drugs: Secondary | ICD-10-CM | POA: Insufficient documentation

## 2021-10-21 LAB — CBC WITH DIFFERENTIAL/PLATELET
Abs Immature Granulocytes: 0.01 10*3/uL (ref 0.00–0.07)
Basophils Absolute: 0 10*3/uL (ref 0.0–0.1)
Basophils Relative: 0 %
Eosinophils Absolute: 0.1 10*3/uL (ref 0.0–0.5)
Eosinophils Relative: 2 %
HCT: 35.1 % — ABNORMAL LOW (ref 36.0–46.0)
Hemoglobin: 11.2 g/dL — ABNORMAL LOW (ref 12.0–15.0)
Immature Granulocytes: 0 %
Lymphocytes Relative: 32 %
Lymphs Abs: 1.7 10*3/uL (ref 0.7–4.0)
MCH: 27.9 pg (ref 26.0–34.0)
MCHC: 31.9 g/dL (ref 30.0–36.0)
MCV: 87.3 fL (ref 80.0–100.0)
Monocytes Absolute: 0.4 10*3/uL (ref 0.1–1.0)
Monocytes Relative: 9 %
Neutro Abs: 2.9 10*3/uL (ref 1.7–7.7)
Neutrophils Relative %: 57 %
Platelets: 227 10*3/uL (ref 150–400)
RBC: 4.02 MIL/uL (ref 3.87–5.11)
RDW: 13.6 % (ref 11.5–15.5)
WBC: 5.1 10*3/uL (ref 4.0–10.5)
nRBC: 0 % (ref 0.0–0.2)

## 2021-10-21 LAB — VITAMIN D 25 HYDROXY (VIT D DEFICIENCY, FRACTURES): Vit D, 25-Hydroxy: 62.21 ng/mL (ref 30–100)

## 2021-10-21 LAB — COMPREHENSIVE METABOLIC PANEL
ALT: 18 U/L (ref 0–44)
AST: 17 U/L (ref 15–41)
Albumin: 3.7 g/dL (ref 3.5–5.0)
Alkaline Phosphatase: 82 U/L (ref 38–126)
Anion gap: 8 (ref 5–15)
BUN: 29 mg/dL — ABNORMAL HIGH (ref 8–23)
CO2: 25 mmol/L (ref 22–32)
Calcium: 9.5 mg/dL (ref 8.9–10.3)
Chloride: 108 mmol/L (ref 98–111)
Creatinine, Ser: 1.15 mg/dL — ABNORMAL HIGH (ref 0.44–1.00)
GFR, Estimated: 54 mL/min — ABNORMAL LOW (ref >60)
Glucose, Bld: 109 mg/dL — ABNORMAL HIGH (ref 70–99)
Potassium: 3.4 mmol/L — ABNORMAL LOW (ref 3.5–5.1)
Sodium: 141 mmol/L (ref 135–145)
Total Bilirubin: 0.7 mg/dL (ref 0.3–1.2)
Total Protein: 7.6 g/dL (ref 6.5–8.1)

## 2021-10-23 ENCOUNTER — Ambulatory Visit (INDEPENDENT_AMBULATORY_CARE_PROVIDER_SITE_OTHER): Payer: BC Managed Care – PPO | Admitting: Orthopedic Surgery

## 2021-10-23 ENCOUNTER — Encounter: Payer: Self-pay | Admitting: Orthopedic Surgery

## 2021-10-23 VITALS — Ht 64.0 in | Wt 211.0 lb

## 2021-10-23 DIAGNOSIS — M1711 Unilateral primary osteoarthritis, right knee: Secondary | ICD-10-CM

## 2021-10-23 NOTE — Patient Instructions (Signed)
Ice the knee twice a day for 30 minutes for the next 5 days ? ?Try to lose 10 pounds ? ?Take your Tylenol and meloxicam for pain ? ?Follow-up as needed ? ?You have received an injection of steroids into the joint. 15% of patients will have increased pain within the 24 hours postinjection.  ? ?This is transient and will go away.  ? ?We recommend that you use ice packs on the injection site for 20 minutes every 2 hours and extra strength Tylenol 2 tablets every 8 as needed until the pain resolves. ? ?If you continue to have pain after taking the Tylenol and using the ice please call the office for further instructions. ? ?

## 2021-10-23 NOTE — Progress Notes (Signed)
FOLLOW UP  ? ?Encounter Diagnosis  ?Name Primary?  ? Primary osteoarthritis of right knee Yes  ? ? ? ?Chief Complaint  ?Patient presents with  ? Knee Pain  ?  Rt knee since Saturday (10/18/21), aching and swelling. Has tried to use rubs, has been standing longer at work.   ? ? ? ?[name] is 66 years old she was last seen on January 30, 2021 her BMI was 36 at that time she was told to take her anti-inflammatories, meloxicam, and try to lose some weight.  She was offered an injection but declined presents now with acute on chronic increase in pain in her right knee with swelling and tightness ? ?Exam reveals a healthy-appearing 66 year old female awake alert and oriented x3 mood and affect are normal she has swelling in the right knee her extension is full but her flexion is only 105 degrees her knee feels stable ? ?We attempted aspiration injection of the right knee under her permission ? ?We obtained 5 cc of clear yellow fluid and injected 40 mg of Depo-Medrol ? ?She should ice the knee 3 times a day over the next 4 to 5 days try to lose 10 pounds continue meloxicam Tylenol ? ?Follow-up as needed ? ?Procedure note ? ?Procedure note injection and aspiration right knee joint ? ?Verbal consent was obtained to aspirate and inject the right knee joint  ? ?Timeout was completed to confirm the site of aspiration and injection ? ?An 18-gauge needle was used to aspirate the knee joint from a suprapatellar lateral approach. ? ?The medications used were 40 mg of Depo-Medrol and 1% lidocaine 3 cc ? ?Anesthesia was provided by ethyl chloride and the skin was prepped with alcohol. ? ?After cleaning the skin with alcohol an 18-gauge needle was used to aspirate the right knee joint. ? ?We obtained 5  cc of fluid clr ? ?We follow this by injection of 40 mg of Depo-Medrol and 3 cc 1% lidocaine. ? ?There were no complications. A sterile bandage was applied. ? ? ?

## 2021-10-28 ENCOUNTER — Inpatient Hospital Stay (HOSPITAL_BASED_OUTPATIENT_CLINIC_OR_DEPARTMENT_OTHER): Payer: BC Managed Care – PPO | Admitting: Hematology

## 2021-10-28 VITALS — BP 138/75 | HR 86 | Temp 98.1°F | Resp 17 | Ht 64.0 in | Wt 211.6 lb

## 2021-10-28 DIAGNOSIS — Z170 Estrogen receptor positive status [ER+]: Secondary | ICD-10-CM | POA: Diagnosis not present

## 2021-10-28 DIAGNOSIS — C50411 Malignant neoplasm of upper-outer quadrant of right female breast: Secondary | ICD-10-CM

## 2021-10-28 NOTE — Progress Notes (Signed)
? ?[facility] ?[address] ?[place], [place] [address] ? ? ?Patient Care Team: ?Monico Blitz, MD as PCP - General (Internal Medicine) ? ?SUMMARY OF ONCOLOGIC HISTORY: ?Oncology History  ?Breast cancer of upper-outer quadrant of right female breast ([place])  ?06/24/2016 Mammogram  ? Diagnostic bilateral mammogram: highly suspicious 3.5 cm mass in the UO R breast with marked enlarged R axillary LN ? ?  ?06/24/2016 Breast US  ? 2.7 x 3.1 x 3.5 cm slightly irregular hypoechoic mass at the 10:00 position of right breast, 6 cm from the nipple.  4.1 x 6 cm right axillar lymph node also identified. ? ?  ?07/01/2016 Pathology Results  ? Infiltrating ductal carcinoma ER+ 80%, PR-, HER 2-, Ki67 90% ? ?  ?07/01/2016 Initial Biopsy  ? Ultrasound guided R breast biopsy and R axillary LN biopsy both were clipped post biopsy ? ?  ?07/20/2016 Imaging  ? MUGA- Upper normal left ventricular ejection fraction of 75% with normal ?LV wall motion. ?  ?07/21/2016 Imaging  ? CT CAP- 2.9 cm right upper outer quadrant breast mass, consistent with known ?primary breast carcinoma. ?  ?Right axillary and subpectoral lymphadenopathy, consistent with ?metastatic disease. ?  ?Tiny sub-cm bilateral pulmonary nodules are indeterminate, but favor ?postinflammatory etiology over early metastases. Recommend continued ?attention on follow-up CT. ?  ?No evidence of metastatic disease within the abdomen or pelvis. ?  ?07/23/2016 Procedure  ? Port placed by Dr. Barry Dienes ? ?  ?07/24/2016 - 12/04/2016 Neo-Adjuvant Chemotherapy  ? Adriamycin/Cytoxan x 4, followed by weekly Taxol x 12  ? ?  ?07/30/2016 Imaging  ? Bone scan: Negative for osseous disease . ? ?  ?09/22/2016 Imaging  ? CT head- 1. Normal CT of the brain. No evidence of intracranial metastatic ?disease. ?2. Frothy secretions throughout most of the paranasal sinuses. ?Correlate clinically for acute sinusitis. This could serve as a ?source of headache. ? ?  ?12/30/2016 Surgery  ? Right lumpectomy with  axillary lymph node dissection by Dr. Arnoldo Morale. ?Surgical path: ?1. Breast, lumpectomy, right ?- FIBROCYSTIC AND FIBROADENOMATOID CHANGE. ?- USUAL DUCTAL HYPERPLASIA. ?- TREATMENT EFFECT. ?- NO RESIDUAL CARCINOMA IDENTIFIED. ?- SEE ONCOLOGY TABLE. ?2. Lymph nodes, regional resection, right axillary contents ?- NINE OF NINE LYMPH NODES NEGATIVE FOR CARCINOMA (0/9). ?- TREATMENT EFFECT. ?Final stage ypT0 ypN0 ? ?  ? ? ?CHIEF COMPLIANT: Follow-up for right breast cancer ? ? ?INTERVAL HISTORY: [name] is a 66 y.o. female here today for follow up of her right breast cancer. Her last visit was on 04/30/2021.  ? ?Today she reports feeling good. She is taking anastrozole and is tolerating it well. She denies hot flashes and aches. She denies new pains.  ? ?REVIEW OF SYSTEMS:   ?Review of Systems  ?Constitutional:  Negative for appetite change and fatigue.  ?HENT:   Positive for trouble swallowing (big pills).   ?Respiratory:  Positive for shortness of breath.   ?Endocrine: Negative for hot flashes.  ?Musculoskeletal:  Negative for arthralgias.  ?Neurological:  Positive for headaches and numbness (feet).  ?All other systems reviewed and are negative. ? ?I have reviewed the past medical history, past surgical history, social history and family history with the patient and they are unchanged from previous note. ? ? ?ALLERGIES:   ?has No Known Allergies. ? ? ?MEDICATIONS:  ?Current Outpatient Medications  ?Medication Sig Dispense Refill  ? acetaminophen (TYLENOL) 650 MG CR tablet Take 1,300 mg by mouth every 8 (eight) hours as needed (pain).    ?  amLODipine (NORVASC) 10 MG tablet Take 10 mg by mouth in the morning.    ? anastrozole (ARIMIDEX) 1 MG tablet TAKE 1 TABLET BY MOUTH EVERY DAY 90 tablet 3  ? aspirin EC 81 MG tablet Take 81 mg by mouth in the morning.    ? atorvastatin (LIPITOR) 10 MG tablet Take 10 mg by mouth daily in the afternoon.  4  ? Calcium-Phosphorus-Vitamin D (CALCIUM GUMMIES PO) Take 1 tablet by  mouth in the morning and at bedtime.    ? cholecalciferol (VITAMIN D) 1000 units tablet Take 1,000 Units by mouth daily in the afternoon.    ? dapagliflozin propanediol (FARXIGA) 10 MG TABS tablet Take by mouth.    ? diclofenac (VOLTAREN) 75 MG EC tablet TAKE 1 TABLET BY MOUTH TWICE A DAY 60 tablet 5  ? fluticasone (FLONASE) 50 MCG/ACT nasal spray Place 2 sprays into both nostrils daily.    ? glyBURIDE (DIABETA) 5 MG tablet Take 5 mg by mouth in the morning.    ? hydrochlorothiazide (HYDRODIURIL) 25 MG tablet Take 25 mg by mouth in the morning.    ? JARDIANCE 10 MG TABS tablet Take 10 mg by mouth in the morning.    ? lisinopril (PRINIVIL,ZESTRIL) 40 MG tablet Take 40 mg by mouth in the morning.    ? meloxicam (MOBIC) 7.5 MG tablet TAKE 1 TABLET (7.5 MG TOTAL) BY MOUTH IN THE MORNING. 30 tablet 5  ? metFORMIN (GLUCOPHAGE) 500 MG tablet Take 500 mg by mouth 2 (two) times daily.    ? pioglitazone (ACTOS) 30 MG tablet Take 30 mg by mouth in the morning.    ? ?No current facility-administered medications for this visit.  ? ? ? ?PHYSICAL EXAMINATION: ?Performance status (ECOG): 1 - Symptomatic but completely ambulatory ? ?Vitals:  ? 10/28/21 1549  ?BP: 138/75  ?Pulse: 86  ?Resp: 17  ?Temp: 98.1 ?F (36.7 ?C)  ?SpO2: 100%  ? ?Wt Readings from Last 3 Encounters:  ?10/28/21 211 lb 9.6 oz (96 kg)  ?10/23/21 211 lb (95.7 kg)  ?04/30/21 211 lb (95.7 kg)  ? ?Physical Exam ?Vitals reviewed.  ?Constitutional:   ?   Appearance: Normal appearance. She is obese.  ?Cardiovascular:  ?   Rate and Rhythm: Normal rate and regular rhythm.  ?   Pulses: Normal pulses.  ?   Heart sounds: Normal heart sounds.  ?Pulmonary:  ?   Effort: Pulmonary effort is normal.  ?   Breath sounds: Normal breath sounds.  ?Chest:  ?Breasts: ?   Right: No swelling, bleeding, inverted nipple, mass, nipple discharge, skin change (Lumpectomy scar @ 10:00 WNL) or tenderness.  ?   Left: No swelling, bleeding, inverted nipple, mass, nipple discharge, skin change or  tenderness.  ?Musculoskeletal:  ?   Right lower leg: No edema.  ?   Left lower leg: No edema.  ?Lymphadenopathy:  ?   Upper Body:  ?   Right upper body: No supraclavicular, axillary or pectoral adenopathy.  ?   Left upper body: No supraclavicular, axillary or pectoral adenopathy.  ?Neurological:  ?   General: No focal deficit present.  ?   Mental Status: She is alert and oriented to person, place, and time.  ?Psychiatric:     ?   Mood and Affect: Mood normal.     ?   Behavior: Behavior normal.  ? ? ?Breast Exam Chaperone: Thana Ates   ? ? ?LABORATORY DATA:  ?I have reviewed the data as listed ? ?  Latest  Ref Rng & Units 10/21/2021  ? 11:20 AM 04/23/2021  ?  1:47 PM 05/17/2020  ? 11:11 AM  ?CMP  ?Glucose 70 - 99 mg/dL 109   171   142    ?BUN 8 - 23 mg/dL 29   25   44    ?Creatinine 0.44 - 1.00 mg/dL 1.15   1.30   1.48    ?Sodium 135 - 145 mmol/L 141   134   137    ?Potassium 3.5 - 5.1 mmol/L 3.4   3.3   3.5    ?Chloride 98 - 111 mmol/L 108   102   102    ?CO2 22 - 32 mmol/L _0 ?Calcium 8.9 - 10.3 mg/dL 9.5   9.2   9.4    ?Total Protein 6.5 - 8.1 g/dL 7.6   8.0   7.7    ?Total Bilirubin 0.3 - 1.2 mg/dL 0.7   0.8   0.7    ?Alkaline Phos 38 - 126 U/L 82   109   94    ?AST 15 - 41 U/L _1 ?ALT 0 - 44 U/L _2 ? ?Lab Results  ?Component Value Date  ? QPY195 22.5 09/27/2018  ? KDT267 20.1 05/06/2018  ? ?Lab Results  ?Component Value Date  ? WBC 5.1 10/21/2021  ? HGB 11.2 (L) 10/21/2021  ? HCT 35.1 (L) 10/21/2021  ? MCV 87.3 10/21/2021  ? PLT 227 10/21/2021  ? NEUTROABS 2.9 10/21/2021  ? ? ?ASSESSMENT:  ?1.  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 ?-Neoadjuvant AC x4 followed by Taxol x12 from 07/24/2016 through 12/04/2016. ?-Status post right lumpectomy, L&D on 12/30/2016 with PCR, YPT0, YPN0. ?-Anastrozole was started in August 2018. ?-She had a mammogram on 09/27/2018 that was BI-RADS Category  4 suspicious.  Recommended stereotactic biopsy of the superior aspect of the lumpectomy site of increasing distortion and density. ?-Biopsy on 10/06/2018 of the right breast upper outer quadrant which show

## 2021-10-28 NOTE — Patient Instructions (Signed)
[place] at [facility] ?Discharge Instructions ? ?You were seen and examined today by Dr. Delton Coombes. ? ?Dr. Delton Coombes discussed your most recent lab work and everything looks good. ? ?We will add on BCI test to your pathology specimens that have already been collected (this does not require anything from you). ? ?Please follow-up as scheduled in 6 months. ? ? ? ?Thank you for choosing [place] at [facility] to provide your oncology and hematology care.  To afford each patient quality time with our provider, please arrive at least 15 minutes before your scheduled appointment time.  ? ?If you have a lab appointment with the [facility] please come in thru the Main Entrance and check in at the main information desk. ? ?You need to re-schedule your appointment should you arrive 10 or more minutes late.  We strive to give you quality time with our providers, and arriving late affects you and other patients whose appointments are after yours.  Also, if you no show three or more times for appointments you may be dismissed from the clinic at the providers discretion.     ?Again, thank you for choosing [facility].  Our hope is that these requests will decrease the amount of time that you wait before being seen by our physicians.       ?_____________________________________________________________ ? ?Should you have questions after your visit to [facility], please contact our office at [phone] and follow the prompts.  Our office hours are 8:00 a.m. and 4:30 p.m. Monday - Friday.  Please note that voicemails left after 4:00 p.m. may not be returned until the following business day.  We are closed weekends and major holidays.  You do have access to a nurse 24-7, just call the main number to the clinic [phone] and do not press any options, hold on the line and a nurse will answer the phone.   ? ?For prescription refill requests, have  your pharmacy contact our office and allow 72 hours.   ? ?Due to Covid, you will need to wear a mask upon entering the hospital. If you do not have a mask, a mask will be given to you at the Main Entrance upon arrival. For doctor visits, patients may have 1 support person age 34 or older with them. For treatment visits, patients can not have anyone with them due to social distancing guidelines and our immunocompromised population.  ? ?  ?

## 2022-02-05 ENCOUNTER — Encounter: Payer: Self-pay | Admitting: Orthopedic Surgery

## 2022-02-05 ENCOUNTER — Ambulatory Visit (INDEPENDENT_AMBULATORY_CARE_PROVIDER_SITE_OTHER): Payer: BC Managed Care – PPO | Admitting: Orthopedic Surgery

## 2022-02-05 ENCOUNTER — Ambulatory Visit (INDEPENDENT_AMBULATORY_CARE_PROVIDER_SITE_OTHER): Payer: BC Managed Care – PPO

## 2022-02-05 DIAGNOSIS — M1711 Unilateral primary osteoarthritis, right knee: Secondary | ICD-10-CM

## 2022-02-05 MED ORDER — METHYLPREDNISOLONE ACETATE 40 MG/ML IJ SUSP
40.0000 mg | Freq: Once | INTRAMUSCULAR | Status: AC
  Administered 2022-02-05: 40 mg via INTRA_ARTICULAR

## 2022-02-05 NOTE — Patient Instructions (Signed)
There is significant arthritis in the right knee  The only way to definitively treat this is with a knee replacement  This can be done at your leisure  You can get an injection every 3 to 4 months  You should continue to keep the weight down, take your meloxicam and Tylenol use ice for swelling follow-up as needed for injection

## 2022-02-05 NOTE — Progress Notes (Signed)
Chief Complaint  Patient presents with   Knee Pain    RT knee/ pain has increased since last visit   66 year old female presents with worsening right knee pain she has been seen in the past and was placed on meloxicam and given injection  She complains of knee pain and knee swelling which increases with activity  Focused exam shows that both knees show limited range of motion especially in flexion only getting back to about 105 degrees.  Although they are stable to ligamentous evaluation they are tender and painful to touch along the joint lines medially and laterally and there appears to be trace joint effusions  She remains neurovascularly intact there are no skin lesions or rashes there is no erythema of the joints are not warm to touch  Previous imaging was a year ago so repeat right knee imaging was done secondary to the worsening right knee pain  Although there is no change in the image compared to the last x-rays she does have medial compartment narrowing and varus alignment with 75% joint space loss medially and surrounding secondary bone changes including osteophytes and sclerosis.  Recommend injection and the following advice:   You should continue to keep the weight down, take your meloxicam and Tylenol use ice for swelling follow-up as needed for injection

## 2022-02-06 ENCOUNTER — Encounter: Payer: Self-pay | Admitting: Orthopedic Surgery

## 2022-04-01 ENCOUNTER — Other Ambulatory Visit: Payer: Self-pay | Admitting: Orthopedic Surgery

## 2022-04-01 DIAGNOSIS — M1711 Unilateral primary osteoarthritis, right knee: Secondary | ICD-10-CM

## 2022-04-01 DIAGNOSIS — G8929 Other chronic pain: Secondary | ICD-10-CM

## 2022-04-29 ENCOUNTER — Inpatient Hospital Stay: Payer: BC Managed Care – PPO | Attending: Hematology

## 2022-04-29 DIAGNOSIS — Z79811 Long term (current) use of aromatase inhibitors: Secondary | ICD-10-CM | POA: Insufficient documentation

## 2022-04-29 DIAGNOSIS — Z7982 Long term (current) use of aspirin: Secondary | ICD-10-CM | POA: Diagnosis not present

## 2022-04-29 DIAGNOSIS — Z79899 Other long term (current) drug therapy: Secondary | ICD-10-CM | POA: Diagnosis not present

## 2022-04-29 DIAGNOSIS — C50411 Malignant neoplasm of upper-outer quadrant of right female breast: Secondary | ICD-10-CM | POA: Diagnosis present

## 2022-04-29 DIAGNOSIS — Z170 Estrogen receptor positive status [ER+]: Secondary | ICD-10-CM | POA: Insufficient documentation

## 2022-04-29 DIAGNOSIS — E559 Vitamin D deficiency, unspecified: Secondary | ICD-10-CM | POA: Diagnosis not present

## 2022-04-29 LAB — CBC WITH DIFFERENTIAL/PLATELET
Abs Immature Granulocytes: 0.02 10*3/uL (ref 0.00–0.07)
Basophils Absolute: 0 10*3/uL (ref 0.0–0.1)
Basophils Relative: 0 %
Eosinophils Absolute: 0.1 10*3/uL (ref 0.0–0.5)
Eosinophils Relative: 2 %
HCT: 35.8 % — ABNORMAL LOW (ref 36.0–46.0)
Hemoglobin: 11.8 g/dL — ABNORMAL LOW (ref 12.0–15.0)
Immature Granulocytes: 0 %
Lymphocytes Relative: 28 %
Lymphs Abs: 1.9 10*3/uL (ref 0.7–4.0)
MCH: 28.6 pg (ref 26.0–34.0)
MCHC: 33 g/dL (ref 30.0–36.0)
MCV: 86.7 fL (ref 80.0–100.0)
Monocytes Absolute: 0.6 10*3/uL (ref 0.1–1.0)
Monocytes Relative: 8 %
Neutro Abs: 4.3 10*3/uL (ref 1.7–7.7)
Neutrophils Relative %: 62 %
Platelets: 220 10*3/uL (ref 150–400)
RBC: 4.13 MIL/uL (ref 3.87–5.11)
RDW: 13.1 % (ref 11.5–15.5)
WBC: 6.9 10*3/uL (ref 4.0–10.5)
nRBC: 0 % (ref 0.0–0.2)

## 2022-04-29 LAB — IRON AND TIBC
Iron: 37 ug/dL (ref 28–170)
Saturation Ratios: 10 % — ABNORMAL LOW (ref 10.4–31.8)
TIBC: 386 ug/dL (ref 250–450)
UIBC: 349 ug/dL

## 2022-04-29 LAB — COMPREHENSIVE METABOLIC PANEL
ALT: 18 U/L (ref 0–44)
AST: 18 U/L (ref 15–41)
Albumin: 3.6 g/dL (ref 3.5–5.0)
Alkaline Phosphatase: 91 U/L (ref 38–126)
Anion gap: 9 (ref 5–15)
BUN: 32 mg/dL — ABNORMAL HIGH (ref 8–23)
CO2: 24 mmol/L (ref 22–32)
Calcium: 9.7 mg/dL (ref 8.9–10.3)
Chloride: 106 mmol/L (ref 98–111)
Creatinine, Ser: 1.47 mg/dL — ABNORMAL HIGH (ref 0.44–1.00)
GFR, Estimated: 40 mL/min — ABNORMAL LOW (ref >60)
Glucose, Bld: 110 mg/dL — ABNORMAL HIGH (ref 70–99)
Potassium: 3.4 mmol/L — ABNORMAL LOW (ref 3.5–5.1)
Sodium: 139 mmol/L (ref 135–145)
Total Bilirubin: 0.4 mg/dL (ref 0.3–1.2)
Total Protein: 7.5 g/dL (ref 6.5–8.1)

## 2022-04-29 LAB — VITAMIN D 25 HYDROXY (VIT D DEFICIENCY, FRACTURES): Vit D, 25-Hydroxy: 66.19 ng/mL (ref 30–100)

## 2022-04-29 LAB — FERRITIN: Ferritin: 36 ng/mL (ref 11–307)

## 2022-05-06 ENCOUNTER — Inpatient Hospital Stay: Payer: BC Managed Care – PPO | Attending: Hematology | Admitting: Hematology

## 2022-05-06 VITALS — BP 150/77 | HR 99 | Temp 98.0°F | Resp 17 | Ht 64.0 in | Wt 215.5 lb

## 2022-05-06 DIAGNOSIS — R053 Chronic cough: Secondary | ICD-10-CM | POA: Insufficient documentation

## 2022-05-06 DIAGNOSIS — R0602 Shortness of breath: Secondary | ICD-10-CM | POA: Diagnosis not present

## 2022-05-06 DIAGNOSIS — Z1231 Encounter for screening mammogram for malignant neoplasm of breast: Secondary | ICD-10-CM | POA: Diagnosis not present

## 2022-05-06 DIAGNOSIS — Z79811 Long term (current) use of aromatase inhibitors: Secondary | ICD-10-CM | POA: Insufficient documentation

## 2022-05-06 DIAGNOSIS — C50411 Malignant neoplasm of upper-outer quadrant of right female breast: Secondary | ICD-10-CM | POA: Diagnosis not present

## 2022-05-06 DIAGNOSIS — Z170 Estrogen receptor positive status [ER+]: Secondary | ICD-10-CM

## 2022-05-06 DIAGNOSIS — Z7984 Long term (current) use of oral hypoglycemic drugs: Secondary | ICD-10-CM | POA: Insufficient documentation

## 2022-05-06 DIAGNOSIS — Z79899 Other long term (current) drug therapy: Secondary | ICD-10-CM | POA: Diagnosis not present

## 2022-05-06 DIAGNOSIS — E559 Vitamin D deficiency, unspecified: Secondary | ICD-10-CM

## 2022-05-06 NOTE — Patient Instructions (Signed)
[place]  Discharge Instructions  You were seen and examined today by Dr. Delton Coombes.  Dr. Delton Coombes discussed your most recent lab work which revealed that your kidney function is elevated.   Be sure that you are drinking plenty of water.   Follow-up as scheduled.    Thank you for choosing [facility] to provide your oncology and hematology care.   To afford each patient quality time with our provider, please arrive at least 15 minutes before your scheduled appointment time. You may need to reschedule your appointment if you arrive late (10 or more minutes). Arriving late affects you and other patients whose appointments are after yours.  Also, if you miss three or more appointments without notifying the office, you may be dismissed from the clinic at the provider's discretion.    Again, thank you for choosing [facility]  Our hope is that these requests will decrease the amount of time that you wait before being seen by our physicians.   If you have a lab appointment with the [facility] please come in thru the Main Entrance and check in at the main information desk.           _____________________________________________________________  Should you have questions after your visit to [facility], please contact our office at [phone] and follow the prompts.  Our office hours are 8:00 a.m. to 4:30 p.m. Monday - Thursday and 8:00 a.m. to 2:30 p.m. Friday.  Please note that voicemails left after 4:00 p.m. may not be returned until the following business day.  We are closed weekends and all major holidays.  You do have access to a nurse 24-7, just call the main number to the clinic [phone] and do not press any options, hold on the line and a nurse will answer the phone.    For prescription refill requests, have your pharmacy contact our office and allow 72 hours.    Masks are optional in the  cancer centers. If you would like for your care team to wear a mask while they are taking care of you, please let them know. You may have one support person who is at least 64 years old accompany you for your appointments.

## 2022-05-06 NOTE — Progress Notes (Signed)
[facility] [address] [place] [address]   Patient Care Team: Monico Blitz, MD as PCP - General (Internal Medicine)  SUMMARY OF ONCOLOGIC HISTORY: Oncology History  Breast cancer of upper-outer quadrant of right female breast ([place])  06/24/2016 Mammogram   Diagnostic bilateral mammogram: highly suspicious 3.5 cm mass in the UO R breast with marked enlarged R axillary LN   06/24/2016 Breast US   2.7 x 3.1 x 3.5 cm slightly irregular hypoechoic mass at the 10:00 position of right breast, 6 cm from the nipple.  4.1 x 6 cm right axillar lymph node also identified.   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   07/21/2016 Imaging   CT CAP- 2.9 cm right upper outer quadrant breast mass, consistent with known primary breast carcinoma.   Right axillary and subpectoral lymphadenopathy, consistent with metastatic disease.   Tiny sub-cm bilateral pulmonary nodules are indeterminate, but favor postinflammatory etiology over early metastases. Recommend continued attention on follow-up CT.   No evidence of metastatic disease within the abdomen or pelvis.   07/23/2016 Procedure   Port placed by Dr. Barry Dienes   07/24/2016 - 12/04/2016 Neo-Adjuvant Chemotherapy   Adriamycin/Cytoxan x 4, followed by weekly Taxol x 12    07/30/2016 Imaging   Bone scan: Negative for osseous disease .   09/22/2016 Imaging   CT head- 1. Normal CT of the brain. No evidence of intracranial metastatic disease. 2. Frothy secretions throughout most of the paranasal sinuses. Correlate clinically for acute sinusitis. This could serve as a source of headache.   12/30/2016 Surgery   Right lumpectomy with axillary lymph  node dissection by Dr. Arnoldo Morale. Surgical path: 1. Breast, lumpectomy, right - FIBROCYSTIC AND FIBROADENOMATOID CHANGE. - USUAL DUCTAL HYPERPLASIA. - TREATMENT EFFECT. - NO RESIDUAL CARCINOMA IDENTIFIED. - SEE ONCOLOGY TABLE. 2. Lymph nodes, regional resection, right axillary contents - NINE OF NINE LYMPH NODES NEGATIVE FOR CARCINOMA (0/9). - TREATMENT EFFECT. Final stage ypT0 ypN0     CHIEF COMPLIANT: Follow-up for right breast cancer   INTERVAL HISTORY: Ms. ROWEN HUR is a 66 y.o. female seen for follow-up of right breast cancer.  She is tolerating anastrozole reasonably well.  Energy levels are 70%.  Chronic cough and shortness of breath has been stable.  REVIEW OF SYSTEMS:   Review of Systems  Constitutional:  Negative for appetite change and fatigue.  Respiratory:  Positive for cough and shortness of breath.   Musculoskeletal:  Negative for arthralgias.  Neurological:  Positive for numbness (feet).  All other systems reviewed and are negative.   I have reviewed the past medical history, past surgical history, social history and family history with the patient and they are unchanged from previous note.   ALLERGIES:   has No Known Allergies.   MEDICATIONS:  Current Outpatient Medications  Medication Sig Dispense Refill   acetaminophen (TYLENOL) 650 MG CR tablet Take 1,300 mg by mouth every 8 (eight) hours as needed (pain).     amLODipine (NORVASC) 10 MG tablet Take 10 mg by mouth in the morning.     anastrozole (ARIMIDEX) 1 MG tablet TAKE 1 TABLET BY MOUTH EVERY DAY 90 tablet 3   aspirin EC 81  MG tablet Take 81 mg by mouth in the morning.     atorvastatin (LIPITOR) 10 MG tablet Take 10 mg by mouth daily in the afternoon.  4   Calcium-Phosphorus-Vitamin D (CALCIUM GUMMIES PO) Take 1 tablet by mouth in the morning and at bedtime.     cholecalciferol (VITAMIN D) 1000 units tablet Take 1,000 Units by mouth daily in the afternoon.     dapagliflozin propanediol  (FARXIGA) 10 MG TABS tablet Take by mouth.     fluticasone (FLONASE) 50 MCG/ACT nasal spray Place 2 sprays into both nostrils daily.     glyBURIDE (DIABETA) 5 MG tablet Take 5 mg by mouth in the morning.     hydrochlorothiazide (HYDRODIURIL) 25 MG tablet Take 25 mg by mouth in the morning.     JARDIANCE 10 MG TABS tablet Take 10 mg by mouth in the morning.     lisinopril (PRINIVIL,ZESTRIL) 40 MG tablet Take 40 mg by mouth in the morning.     meloxicam (MOBIC) 7.5 MG tablet TAKE 1 TABLET (7.5 MG TOTAL) BY MOUTH IN THE MORNING 30 tablet 5   metFORMIN (GLUCOPHAGE) 500 MG tablet Take 500 mg by mouth 2 (two) times daily.     pioglitazone (ACTOS) 30 MG tablet Take 30 mg by mouth in the morning.     No current facility-administered medications for this visit.     PHYSICAL EXAMINATION: Performance status (ECOG): 1 - Symptomatic but completely ambulatory  Vitals:   05/06/22 1550  BP: (!) 150/77  Pulse: 99  Resp: 17  Temp: 98 F (36.7 C)  SpO2: 97%   Wt Readings from Last 3 Encounters:  05/06/22 215 lb 8 oz (97.8 kg)  10/28/21 211 lb 9.6 oz (96 kg)  10/23/21 211 lb (95.7 kg)   Physical Exam Vitals reviewed.  Constitutional:      Appearance: Normal appearance. She is obese.  Cardiovascular:     Rate and Rhythm: Normal rate and regular rhythm.     Pulses: Normal pulses.     Heart sounds: Normal heart sounds.  Pulmonary:     Effort: Pulmonary effort is normal.     Breath sounds: Normal breath sounds.  Chest:  Breasts:    Right: No swelling, bleeding, inverted nipple, mass, nipple discharge, skin change (Lumpectomy scar @ 10:00 WNL) or tenderness.     Left: No swelling, bleeding, inverted nipple, mass, nipple discharge, skin change or tenderness.  Musculoskeletal:     Right lower leg: No edema.     Left lower leg: No edema.  Lymphadenopathy:     Upper Body:     Right upper body: No supraclavicular, axillary or pectoral adenopathy.     Left upper body: No supraclavicular,  axillary or pectoral adenopathy.  Neurological:     General: No focal deficit present.     Mental Status: She is alert and oriented to person, place, and time.  Psychiatric:        Mood and Affect: Mood normal.        Behavior: Behavior normal.     Breast Exam Chaperone: Thana Ates     LABORATORY DATA:  I have reviewed the data as listed    Latest Ref Rng & Units 04/29/2022    2:13 PM 10/21/2021   11:20 AM 04/23/2021    1:47 PM  CMP  Glucose 70 - 99 mg/dL 110  109  171   BUN 8 - 23 mg/dL 32  29  25   Creatinine 0.44 - 1.00  mg/dL 1.47  1.15  1.30   Sodium 135 - 145 mmol/L 139  141  134   Potassium 3.5 - 5.1 mmol/L 3.4  3.4  3.3   Chloride 98 - 111 mmol/L 106  108  102   CO2 22 - 32 mmol/L _0 Calcium 8.9 - 10.3 mg/dL 9.7  9.5  9.2   Total Protein 6.5 - 8.1 g/dL 7.5  7.6  8.0   Total Bilirubin 0.3 - 1.2 mg/dL 0.4  0.7  0.8   Alkaline Phos 38 - 126 U/L 91  82  109   AST 15 - 41 U/L _1 ALT 0 - 44 U/L _2 Lab Results  Component Value Date   CAN153 22.5 09/27/2018   CAN153 20.1 05/06/2018   Lab Results  Component Value Date   WBC 6.9 04/29/2022   HGB 11.8 (L) 04/29/2022   HCT 35.8 (L) 04/29/2022   MCV 86.7 04/29/2022   PLT 220 04/29/2022   NEUTROABS 4.3 04/29/2022    ASSESSMENT:  1.  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 -Neoadjuvant AC x4 followed by Taxol x12 from 07/24/2016 through 12/04/2016. -Status post right lumpectomy, L&D on 12/30/2016 with PCR, YPT0, YPN0. -Anastrozole was started in August 2018. -She had a mammogram on 09/27/2018 that was BI-RADS Category 4 suspicious.  Recommended stereotactic biopsy of the superior aspect of the lumpectomy site of increasing distortion and density. -Biopsy on 10/06/2018 of the right breast upper outer quadrant which shows benign breast parenchyma with fibrosis and hemosidern containing macrophages.   Negative for carcinoma. - Mammogram on 10/15/2020, BI-RADS Category 2.   2.  Bone density: -DEXA scan on 05/20/2017 showed T score of -0.6 normal. -Last DEXA scan on 10/10/2019 showed a T score of -1.0.     PLAN:  1.  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 - Mammogram on 10/21/2021 was BI-RADS Category 2. - Physical examination today did not reveal any new masses.  Right breast upper outer quadrant lumpectomy scar is within normal limits.  No palpable adenopathy. - Reviewed labs which shows normal LFTs.  CKD with creatinine of 1.47.  CBC was grossly normal.  Ferritin was 36 and percent saturation 10. - She may take iron tablet 3 times a week with stool softener if she feels tired. - We discussed BCI results: 18% recurrence rate after 5 years of AI therapy.  This is reduced to 67% with 10 years of AI therapy.  She will benefit from taking extended AI therapy. - Continue anastrozole for total of 10 years. - RTC 1 year for follow-up.  We will schedule her for mammogram in April next year.   2.  Bone health: - Vitamin D 66.  Continue calcium and vitamin D levels.  3.  CKD: - Refer to Dr. Theador Hawthorne.  Breast Cancer therapy associated bone loss: I have recommended calcium, Vitamin D and weight bearing exercises.  Orders placed this encounter:  Orders Placed This Encounter  Procedures   MM 3D SCREEN BREAST BILATERAL   Comprehensive metabolic panel   CBC with Differential/Platelet   VITAMIN D 25 Hydroxy (Vit-D Deficiency, Fractures)     The patient has a good understanding of the overall plan. She agrees with it. She will call with any problems that may  develop before the next visit here.  Derek Jack, MD [facility] [phone]

## 2022-06-25 ENCOUNTER — Ambulatory Visit (INDEPENDENT_AMBULATORY_CARE_PROVIDER_SITE_OTHER): Payer: BC Managed Care – PPO | Admitting: Orthopedic Surgery

## 2022-06-25 DIAGNOSIS — G8929 Other chronic pain: Secondary | ICD-10-CM

## 2022-06-25 DIAGNOSIS — M25562 Pain in left knee: Secondary | ICD-10-CM

## 2022-06-25 DIAGNOSIS — M1711 Unilateral primary osteoarthritis, right knee: Secondary | ICD-10-CM

## 2022-06-25 MED ORDER — METHYLPREDNISOLONE ACETATE 40 MG/ML IJ SUSP
40.0000 mg | Freq: Once | INTRAMUSCULAR | Status: AC
  Administered 2022-06-25: 40 mg via INTRA_ARTICULAR

## 2022-06-25 NOTE — Progress Notes (Signed)
Chief Complaint  Patient presents with   Knee Pain    Wants injection right knee    Encounter Diagnoses  Name Primary?   Primary osteoarthritis of right knee Yes   Chronic pain of left knee    Procedure note right knee injection   verbal consent was obtained to inject right knee joint  Timeout was completed to confirm the site of injection  The medications used were depomedrol 40 mg and 1% lidocaine 3 cc Anesthesia was provided by ethyl chloride and the skin was prepped with alcohol.  After cleaning the skin with alcohol a 20-gauge needle was used to inject the right knee joint. There were no complications. A sterile bandage was applied.

## 2022-06-25 NOTE — Patient Instructions (Signed)

## 2022-08-15 ENCOUNTER — Other Ambulatory Visit: Payer: Self-pay | Admitting: Hematology

## 2022-09-01 ENCOUNTER — Other Ambulatory Visit: Payer: Self-pay | Admitting: Orthopedic Surgery

## 2022-09-01 DIAGNOSIS — M1711 Unilateral primary osteoarthritis, right knee: Secondary | ICD-10-CM

## 2022-09-01 DIAGNOSIS — G8929 Other chronic pain: Secondary | ICD-10-CM

## 2022-09-02 ENCOUNTER — Telehealth: Payer: Self-pay | Admitting: Orthopedic Surgery

## 2022-09-02 NOTE — Telephone Encounter (Signed)
Returned the pt's call to scheduled for her knee.  LVM for her to call us back.

## 2022-09-03 ENCOUNTER — Telehealth: Payer: Self-pay | Admitting: Orthopedic Surgery

## 2022-09-03 NOTE — Telephone Encounter (Signed)
Returned the patient's call, lvm for her to call us back.  She'd like to schedule an appointment for her knee.

## 2022-10-26 ENCOUNTER — Ambulatory Visit (HOSPITAL_COMMUNITY): Payer: BC Managed Care – PPO

## 2022-11-02 ENCOUNTER — Ambulatory Visit (HOSPITAL_COMMUNITY)
Admission: RE | Admit: 2022-11-02 | Discharge: 2022-11-02 | Disposition: A | Discharge status: Home / Self Care | Payer: BC Managed Care – PPO | Source: Ambulatory Visit | Attending: Hematology | Admitting: Hematology

## 2022-11-02 DIAGNOSIS — Z1231 Encounter for screening mammogram for malignant neoplasm of breast: Secondary | ICD-10-CM | POA: Diagnosis not present

## 2022-11-02 DIAGNOSIS — C50411 Malignant neoplasm of upper-outer quadrant of right female breast: Secondary | ICD-10-CM | POA: Insufficient documentation

## 2022-11-02 DIAGNOSIS — Z170 Estrogen receptor positive status [ER+]: Secondary | ICD-10-CM | POA: Diagnosis present

## 2023-03-13 ENCOUNTER — Other Ambulatory Visit: Payer: Self-pay | Admitting: Orthopedic Surgery

## 2023-03-13 DIAGNOSIS — M1711 Unilateral primary osteoarthritis, right knee: Secondary | ICD-10-CM

## 2023-03-13 DIAGNOSIS — G8929 Other chronic pain: Secondary | ICD-10-CM

## 2023-05-03 ENCOUNTER — Inpatient Hospital Stay: Payer: BC Managed Care – PPO

## 2023-05-06 ENCOUNTER — Inpatient Hospital Stay: Payer: BC Managed Care – PPO | Attending: Hematology

## 2023-05-06 DIAGNOSIS — Z79810 Long term (current) use of selective estrogen receptor modulators (SERMs): Secondary | ICD-10-CM | POA: Insufficient documentation

## 2023-05-06 DIAGNOSIS — Z170 Estrogen receptor positive status [ER+]: Secondary | ICD-10-CM | POA: Insufficient documentation

## 2023-05-06 DIAGNOSIS — E559 Vitamin D deficiency, unspecified: Secondary | ICD-10-CM | POA: Insufficient documentation

## 2023-05-06 DIAGNOSIS — C50411 Malignant neoplasm of upper-outer quadrant of right female breast: Secondary | ICD-10-CM

## 2023-05-06 DIAGNOSIS — C50911 Malignant neoplasm of unspecified site of right female breast: Secondary | ICD-10-CM | POA: Diagnosis present

## 2023-05-06 LAB — CBC WITH DIFFERENTIAL/PLATELET
Abs Immature Granulocytes: 0.01 10*3/uL (ref 0.00–0.07)
Basophils Absolute: 0 10*3/uL (ref 0.0–0.1)
Basophils Relative: 1 %
Eosinophils Absolute: 0.1 10*3/uL (ref 0.0–0.5)
Eosinophils Relative: 2 %
HCT: 36.5 % (ref 36.0–46.0)
Hemoglobin: 12.1 g/dL (ref 12.0–15.0)
Immature Granulocytes: 0 %
Lymphocytes Relative: 33 %
Lymphs Abs: 2.1 10*3/uL (ref 0.7–4.0)
MCH: 29 pg (ref 26.0–34.0)
MCHC: 33.2 g/dL (ref 30.0–36.0)
MCV: 87.5 fL (ref 80.0–100.0)
Monocytes Absolute: 0.5 10*3/uL (ref 0.1–1.0)
Monocytes Relative: 8 %
Neutro Abs: 3.5 10*3/uL (ref 1.7–7.7)
Neutrophils Relative %: 56 %
Platelets: 203 10*3/uL (ref 150–400)
RBC: 4.17 MIL/uL (ref 3.87–5.11)
RDW: 13.1 % (ref 11.5–15.5)
WBC: 6.2 10*3/uL (ref 4.0–10.5)
nRBC: 0 % (ref 0.0–0.2)

## 2023-05-06 LAB — COMPREHENSIVE METABOLIC PANEL
ALT: 20 U/L (ref 0–44)
AST: 20 U/L (ref 15–41)
Albumin: 3.7 g/dL (ref 3.5–5.0)
Alkaline Phosphatase: 100 U/L (ref 38–126)
Anion gap: 10 (ref 5–15)
BUN: 27 mg/dL — ABNORMAL HIGH (ref 8–23)
CO2: 24 mmol/L (ref 22–32)
Calcium: 9.5 mg/dL (ref 8.9–10.3)
Chloride: 100 mmol/L (ref 98–111)
Creatinine, Ser: 1.48 mg/dL — ABNORMAL HIGH (ref 0.44–1.00)
GFR, Estimated: 39 mL/min — ABNORMAL LOW (ref >60)
Glucose, Bld: 119 mg/dL — ABNORMAL HIGH (ref 70–99)
Potassium: 3.4 mmol/L — ABNORMAL LOW (ref 3.5–5.1)
Sodium: 134 mmol/L — ABNORMAL LOW (ref 135–145)
Total Bilirubin: 0.6 mg/dL (ref 0.3–1.2)
Total Protein: 7.5 g/dL (ref 6.5–8.1)

## 2023-05-07 LAB — VITAMIN D 25 HYDROXY (VIT D DEFICIENCY, FRACTURES): Vit D, 25-Hydroxy: 67.57 ng/mL (ref 30–100)

## 2023-05-10 ENCOUNTER — Inpatient Hospital Stay: Payer: BC Managed Care – PPO | Attending: Hematology | Admitting: Hematology

## 2023-05-10 DIAGNOSIS — Z170 Estrogen receptor positive status [ER+]: Secondary | ICD-10-CM | POA: Insufficient documentation

## 2023-05-10 DIAGNOSIS — M810 Age-related osteoporosis without current pathological fracture: Secondary | ICD-10-CM

## 2023-05-10 DIAGNOSIS — Z79899 Other long term (current) drug therapy: Secondary | ICD-10-CM | POA: Insufficient documentation

## 2023-05-10 DIAGNOSIS — E559 Vitamin D deficiency, unspecified: Secondary | ICD-10-CM

## 2023-05-10 DIAGNOSIS — Z9221 Personal history of antineoplastic chemotherapy: Secondary | ICD-10-CM | POA: Diagnosis not present

## 2023-05-10 DIAGNOSIS — M8580 Other specified disorders of bone density and structure, unspecified site: Secondary | ICD-10-CM | POA: Insufficient documentation

## 2023-05-10 DIAGNOSIS — C50411 Malignant neoplasm of upper-outer quadrant of right female breast: Secondary | ICD-10-CM | POA: Insufficient documentation

## 2023-05-10 DIAGNOSIS — Z79811 Long term (current) use of aromatase inhibitors: Secondary | ICD-10-CM | POA: Diagnosis not present

## 2023-05-10 NOTE — Progress Notes (Signed)
[facility] [address] [place] [address]   Patient Care Team: Kirstie Peri, MD as PCP - General (Internal Medicine)  SUMMARY OF ONCOLOGIC HISTORY: Oncology History  Breast cancer of upper-outer quadrant of right female breast (HCC)  06/24/2016 Mammogram   Diagnostic bilateral mammogram: highly suspicious 3.5 cm mass in the UO R breast with marked enlarged R axillary LN   06/24/2016 Breast US   2.7 x 3.1 x 3.5 cm slightly irregular hypoechoic mass at the 10:00 position of right breast, 6 cm from the nipple.  4.1 x 6 cm right axillar lymph node also identified.   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   07/21/2016 Imaging   CT CAP- 2.9 cm right upper outer quadrant breast mass, consistent with known primary breast carcinoma.   Right axillary and subpectoral lymphadenopathy, consistent with metastatic disease.   Tiny sub-cm bilateral pulmonary nodules are indeterminate, but favor postinflammatory etiology over early metastases. Recommend continued attention on follow-up CT.   No evidence of metastatic disease within the abdomen or pelvis.   07/23/2016 Procedure   Port placed by Dr. Donell Beers   07/24/2016 - 12/04/2016 Neo-Adjuvant Chemotherapy   Adriamycin/Cytoxan x 4, followed by weekly Taxol x 12    07/30/2016 Imaging   Bone scan: Negative for osseous disease .   09/22/2016 Imaging   CT head- 1. Normal CT of the brain. No evidence of intracranial metastatic disease. 2. Frothy secretions throughout most of the paranasal sinuses. Correlate clinically for acute sinusitis. This could serve as a source of headache.   12/30/2016 Surgery   Right lumpectomy with axillary lymph  node dissection by Dr. Lovell Sheehan. Surgical path: 1. Breast, lumpectomy, right - FIBROCYSTIC AND FIBROADENOMATOID CHANGE. - USUAL DUCTAL HYPERPLASIA. - TREATMENT EFFECT. - NO RESIDUAL CARCINOMA IDENTIFIED. - SEE ONCOLOGY TABLE. 2. Lymph nodes, regional resection, right axillary contents - NINE OF NINE LYMPH NODES NEGATIVE FOR CARCINOMA (0/9). - TREATMENT EFFECT. Final stage ypT0 ypN0     CHIEF COMPLIANT: Follow-up for right breast cancer   INTERVAL HISTORY: [name] is a 67 y.o. female seen for follow-up of right breast cancer.  She reports that she is tolerating anastrozole reasonably well.  Reports appetite 100% and energy level 75%.  Dyspnea on exertion is stable.  REVIEW OF SYSTEMS:   Review of Systems  Respiratory:  Positive for shortness of breath.   All other systems reviewed and are negative.   I have reviewed the past medical history, past surgical history, social history and family history with the patient and they are unchanged from previous note.   ALLERGIES:   has No Known Allergies.   MEDICATIONS:  Current Outpatient Medications  Medication Sig Dispense Refill   acetaminophen (TYLENOL) 650 MG CR tablet Take 1,300 mg by mouth every 8 (eight) hours as needed (pain).     amLODipine (NORVASC) 10 MG tablet Take 10 mg by mouth in the morning.     anastrozole (ARIMIDEX) 1 MG tablet TAKE 1 TABLET BY MOUTH EVERY DAY 90 tablet 3   aspirin EC 81 MG tablet Take 81 mg by mouth in the morning.     atorvastatin (LIPITOR) 10 MG tablet Take 10 mg  by mouth daily in the afternoon.  4   Calcium-Phosphorus-Vitamin D (CALCIUM GUMMIES PO) Take 1 tablet by mouth in the morning and at bedtime.     cholecalciferol (VITAMIN D) 1000 units tablet Take 1,000 Units by mouth daily in the afternoon.     dapagliflozin propanediol (FARXIGA) 10 MG TABS tablet Take by mouth.     fluticasone (FLONASE) 50 MCG/ACT nasal spray Place 2 sprays into both nostrils daily.     glyBURIDE  (DIABETA) 5 MG tablet Take 5 mg by mouth in the morning.     hydrochlorothiazide (HYDRODIURIL) 25 MG tablet Take 25 mg by mouth in the morning.     JARDIANCE 10 MG TABS tablet Take 10 mg by mouth in the morning.     lisinopril (PRINIVIL,ZESTRIL) 40 MG tablet Take 40 mg by mouth in the morning.     meloxicam (MOBIC) 7.5 MG tablet TAKE 1 TABLET (7.5 MG TOTAL) BY MOUTH IN THE MORNING 30 tablet 5   metFORMIN (GLUCOPHAGE) 500 MG tablet Take 500 mg by mouth 2 (two) times daily.     pioglitazone (ACTOS) 30 MG tablet Take 30 mg by mouth in the morning.     No current facility-administered medications for this visit.     PHYSICAL EXAMINATION: Performance status (ECOG): 1 - Symptomatic but completely ambulatory  There were no vitals filed for this visit.  Wt Readings from Last 3 Encounters:  05/06/22 215 lb 8 oz (97.8 kg)  10/28/21 211 lb 9.6 oz (96 kg)  10/23/21 211 lb (95.7 kg)   Physical Exam Vitals reviewed.  Constitutional:      Appearance: Normal appearance. She is obese.  Cardiovascular:     Rate and Rhythm: Normal rate and regular rhythm.     Pulses: Normal pulses.     Heart sounds: Normal heart sounds.  Pulmonary:     Effort: Pulmonary effort is normal.     Breath sounds: Normal breath sounds.  Chest:  Breasts:    Right: No swelling, bleeding, inverted nipple, mass, nipple discharge, skin change (Lumpectomy scar @ 10:00 WNL) or tenderness.     Left: No swelling, bleeding, inverted nipple, mass, nipple discharge, skin change or tenderness.  Musculoskeletal:     Right lower leg: No edema.     Left lower leg: No edema.  Lymphadenopathy:     Upper Body:     Right upper body: No supraclavicular, axillary or pectoral adenopathy.     Left upper body: No supraclavicular, axillary or pectoral adenopathy.  Neurological:     General: No focal deficit present.     Mental Status: She is alert and oriented to person, place, and time.  Psychiatric:        Mood and Affect: Mood normal.         Behavior: Behavior normal.     LABORATORY DATA:  I have reviewed the data as listed    Latest Ref Rng & Units 05/06/2023    3:26 PM 04/29/2022    2:13 PM 10/21/2021   11:20 AM  CMP  Glucose 70 - 99 mg/dL 109  604  540   BUN 8 - 23 mg/dL 27  32  29   Creatinine 0.44 - 1.00 mg/dL 9.81  1.91  4.78   Sodium 135 - 145 mmol/L 134  139  141   Potassium 3.5 - 5.1 mmol/L 3.4  3.4  3.4   Chloride 98 - 111 mmol/L 100  106  108   CO2 22 - 32  mmol/L 24  24  25    Calcium 8.9 - 10.3 mg/dL 9.5  9.7  9.5   Total Protein 6.5 - 8.1 g/dL 7.5  7.5  7.6   Total Bilirubin 0.3 - 1.2 mg/dL 0.6  0.4  0.7   Alkaline Phos 38 - 126 U/L 100  91  82   AST 15 - 41 U/L 20  18  17    ALT 0 - 44 U/L 20  18  18     Lab Results  Component Value Date   CAN153 22.5 09/27/2018   CAN153 20.1 05/06/2018   Lab Results  Component Value Date   WBC 6.2 05/06/2023   HGB 12.1 05/06/2023   HCT 36.5 05/06/2023   MCV 87.5 05/06/2023   PLT 203 05/06/2023   NEUTROABS 3.5 05/06/2023    ASSESSMENT:  1.  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 -Neoadjuvant AC x4 followed by Taxol x12 from 07/24/2016 through 12/04/2016. -Status post right lumpectomy, L&D on 12/30/2016 with PCR, YPT0, YPN0. -Anastrozole was started in August 2018. -She had a mammogram on 09/27/2018 that was BI-RADS Category 4 suspicious.  Recommended stereotactic biopsy of the superior aspect of the lumpectomy site of increasing distortion and density. -Biopsy on 10/06/2018 of the right breast upper outer quadrant which shows benign breast parenchyma with fibrosis and hemosidern containing macrophages.  Negative for carcinoma. - BCI: 18% recurrence rate after 5 years of AI therapy.  This is reduced by 67% with 10 years of AI therapy.   2.  Bone density: -DEXA scan on 05/20/2017 showed T score of -0.6 normal. -Last DEXA scan on 10/10/2019 showed a T score of -1.0.      PLAN:  1.  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 - She will continue anastrozole for 10 years. - Reviewed mammogram from 11/02/2022: BI-RADS Category 1. - Reviewed labs from 05/06/2023: Normal LFTs and CBC. - RTC 1 year for follow-up with repeat mammogram.   2.  Bone health: - Continue calcium and vitamin D levels.  Vitamin D is 42. - Will repeat DEXA scan prior to next visit in 1 year.  3.  CKD: - Creatinine is 1.48.  I have referred her to Dr. Wolfgang Phoenix at last visit.  She wants to hold off on it at this time.  If there is any worsening, we will consider referring her to nephrology.  Breast Cancer therapy associated bone loss: I have recommended calcium, Vitamin D and weight bearing exercises.  Orders placed this encounter:  No orders of the defined types were placed in this encounter.    The patient has a good understanding of the overall plan. She agrees with it. She will call with any problems that may develop before the next visit here.  Doreatha Massed, MD [facility] [phone]

## 2023-05-10 NOTE — Patient Instructions (Signed)
[facility] at [facility] Discharge Instructions   You were seen and examined today by Dr. Ellin Saba.  He reviewed the results of your lab work which are normal.  We will see you back in one year. We will repeat a mammogram and a bone density test prior to your next visit.   Return as scheduled.    Thank you for choosing [facility] at [facility] to provide your oncology and hematology care.  To afford each patient quality time with our provider, please arrive at least 15 minutes before your scheduled appointment time.   If you have a lab appointment with the Cancer Center please come in thru the Main Entrance and check in at the main information desk.  You need to re-schedule your appointment should you arrive 10 or more minutes late.  We strive to give you quality time with our providers, and arriving late affects you and other patients whose appointments are after yours.  Also, if you no show three or more times for appointments you may be dismissed from the clinic at the providers discretion.     Again, thank you for choosing [facility].  Our hope is that these requests will decrease the amount of time that you wait before being seen by our physicians.       _____________________________________________________________  Should you have questions after your visit to [facility], please contact our office at [phone] and follow the prompts.  Our office hours are 8:00 a.m. and 4:30 p.m. Monday - Friday.  Please note that voicemails left after 4:00 p.m. may not be returned until the following business day.  We are closed weekends and major holidays.  You do have access to a nurse 24-7, just call the main number to the clinic [phone] and do not press any options, hold on the line and a nurse will answer the phone.    For prescription refill requests, have your pharmacy contact our office and allow 72 hours.     Due to Covid, you will need to wear a mask upon entering the hospital. If you do not have a mask, a mask will be given to you at the Main Entrance upon arrival. For doctor visits, patients may have 1 support person age 68 or older with them. For treatment visits, patients can not have anyone with them due to social distancing guidelines and our immunocompromised population.

## 2023-08-08 ENCOUNTER — Other Ambulatory Visit: Payer: Self-pay | Admitting: Hematology

## 2023-09-14 ENCOUNTER — Other Ambulatory Visit: Payer: Self-pay | Admitting: Orthopedic Surgery

## 2023-09-14 DIAGNOSIS — M1711 Unilateral primary osteoarthritis, right knee: Secondary | ICD-10-CM

## 2023-09-14 DIAGNOSIS — G8929 Other chronic pain: Secondary | ICD-10-CM

## 2023-10-25 ENCOUNTER — Ambulatory Visit (HOSPITAL_COMMUNITY): Payer: BC Managed Care – PPO

## 2024-02-16 ENCOUNTER — Ambulatory Visit (HOSPITAL_COMMUNITY)
Admission: RE | Admit: 2024-02-16 | Discharge: 2024-02-16 | Disposition: A | Discharge status: Home / Self Care | Source: Ambulatory Visit | Attending: Hematology | Admitting: Hematology

## 2024-02-16 DIAGNOSIS — M810 Age-related osteoporosis without current pathological fracture: Secondary | ICD-10-CM | POA: Insufficient documentation

## 2024-02-16 DIAGNOSIS — Z170 Estrogen receptor positive status [ER+]: Secondary | ICD-10-CM | POA: Insufficient documentation

## 2024-02-16 DIAGNOSIS — C50411 Malignant neoplasm of upper-outer quadrant of right female breast: Secondary | ICD-10-CM | POA: Diagnosis present

## 2024-03-22 ENCOUNTER — Other Ambulatory Visit: Payer: Self-pay | Admitting: Orthopedic Surgery

## 2024-03-22 DIAGNOSIS — M1711 Unilateral primary osteoarthritis, right knee: Secondary | ICD-10-CM

## 2024-03-22 DIAGNOSIS — G8929 Other chronic pain: Secondary | ICD-10-CM

## 2024-03-27 ENCOUNTER — Telehealth: Payer: Self-pay | Admitting: Orthopedic Surgery

## 2024-03-27 NOTE — Telephone Encounter (Signed)
 Returned the pt's call, lvm for her to cb.

## 2024-04-19 ENCOUNTER — Other Ambulatory Visit (HOSPITAL_COMMUNITY): Payer: BC Managed Care – PPO

## 2024-05-05 ENCOUNTER — Other Ambulatory Visit: Payer: Self-pay

## 2024-05-05 DIAGNOSIS — E559 Vitamin D deficiency, unspecified: Secondary | ICD-10-CM

## 2024-05-05 DIAGNOSIS — Z170 Estrogen receptor positive status [ER+]: Secondary | ICD-10-CM

## 2024-05-08 ENCOUNTER — Inpatient Hospital Stay: Payer: BC Managed Care – PPO | Attending: Physician Assistant

## 2024-05-08 DIAGNOSIS — Z1732 Human epidermal growth factor receptor 2 negative status: Secondary | ICD-10-CM | POA: Insufficient documentation

## 2024-05-08 DIAGNOSIS — Z79811 Long term (current) use of aromatase inhibitors: Secondary | ICD-10-CM | POA: Insufficient documentation

## 2024-05-08 DIAGNOSIS — N1832 Chronic kidney disease, stage 3b: Secondary | ICD-10-CM | POA: Insufficient documentation

## 2024-05-08 DIAGNOSIS — C50411 Malignant neoplasm of upper-outer quadrant of right female breast: Secondary | ICD-10-CM | POA: Insufficient documentation

## 2024-05-08 DIAGNOSIS — Z170 Estrogen receptor positive status [ER+]: Secondary | ICD-10-CM | POA: Insufficient documentation

## 2024-05-08 DIAGNOSIS — Z9011 Acquired absence of right breast and nipple: Secondary | ICD-10-CM | POA: Insufficient documentation

## 2024-05-08 DIAGNOSIS — Z87891 Personal history of nicotine dependence: Secondary | ICD-10-CM | POA: Insufficient documentation

## 2024-05-08 DIAGNOSIS — Z1722 Progesterone receptor negative status: Secondary | ICD-10-CM | POA: Insufficient documentation

## 2024-05-10 ENCOUNTER — Inpatient Hospital Stay

## 2024-05-10 DIAGNOSIS — Z170 Estrogen receptor positive status [ER+]: Secondary | ICD-10-CM

## 2024-05-10 DIAGNOSIS — Z1722 Progesterone receptor negative status: Secondary | ICD-10-CM | POA: Diagnosis not present

## 2024-05-10 DIAGNOSIS — Z1732 Human epidermal growth factor receptor 2 negative status: Secondary | ICD-10-CM | POA: Diagnosis not present

## 2024-05-10 DIAGNOSIS — Z9011 Acquired absence of right breast and nipple: Secondary | ICD-10-CM | POA: Diagnosis not present

## 2024-05-10 DIAGNOSIS — E559 Vitamin D deficiency, unspecified: Secondary | ICD-10-CM

## 2024-05-10 DIAGNOSIS — C50411 Malignant neoplasm of upper-outer quadrant of right female breast: Secondary | ICD-10-CM | POA: Diagnosis present

## 2024-05-10 DIAGNOSIS — Z79811 Long term (current) use of aromatase inhibitors: Secondary | ICD-10-CM | POA: Diagnosis not present

## 2024-05-10 DIAGNOSIS — Z87891 Personal history of nicotine dependence: Secondary | ICD-10-CM | POA: Diagnosis not present

## 2024-05-10 DIAGNOSIS — N1832 Chronic kidney disease, stage 3b: Secondary | ICD-10-CM | POA: Diagnosis not present

## 2024-05-10 LAB — CBC WITH DIFFERENTIAL/PLATELET
Abs Immature Granulocytes: 0.01 K/uL (ref 0.00–0.07)
Basophils Absolute: 0 K/uL (ref 0.0–0.1)
Basophils Relative: 1 %
Eosinophils Absolute: 0.1 K/uL (ref 0.0–0.5)
Eosinophils Relative: 2 %
HCT: 38.4 % (ref 36.0–46.0)
Hemoglobin: 12.7 g/dL (ref 12.0–15.0)
Immature Granulocytes: 0 %
Lymphocytes Relative: 34 %
Lymphs Abs: 2.1 K/uL (ref 0.7–4.0)
MCH: 29.5 pg (ref 26.0–34.0)
MCHC: 33.1 g/dL (ref 30.0–36.0)
MCV: 89.1 fL (ref 80.0–100.0)
Monocytes Absolute: 0.6 K/uL (ref 0.1–1.0)
Monocytes Relative: 9 %
Neutro Abs: 3.4 K/uL (ref 1.7–7.7)
Neutrophils Relative %: 54 %
Platelets: 194 K/uL (ref 150–400)
RBC: 4.31 MIL/uL (ref 3.87–5.11)
RDW: 13.1 % (ref 11.5–15.5)
WBC: 6.2 K/uL (ref 4.0–10.5)
nRBC: 0 % (ref 0.0–0.2)

## 2024-05-10 LAB — COMPREHENSIVE METABOLIC PANEL WITH GFR
ALT: 17 U/L (ref 0–44)
AST: 24 U/L (ref 15–41)
Albumin: 4.2 g/dL (ref 3.5–5.0)
Alkaline Phosphatase: 104 U/L (ref 38–126)
Anion gap: 10 (ref 5–15)
BUN: 26 mg/dL — ABNORMAL HIGH (ref 8–23)
CO2: 28 mmol/L (ref 22–32)
Calcium: 9.8 mg/dL (ref 8.9–10.3)
Chloride: 105 mmol/L (ref 98–111)
Creatinine, Ser: 1.55 mg/dL — ABNORMAL HIGH (ref 0.44–1.00)
GFR, Estimated: 37 mL/min — ABNORMAL LOW (ref >60)
Glucose, Bld: 97 mg/dL (ref 70–99)
Potassium: 3.8 mmol/L (ref 3.5–5.1)
Sodium: 143 mmol/L (ref 135–145)
Total Bilirubin: 0.4 mg/dL (ref 0.0–1.2)
Total Protein: 7.5 g/dL (ref 6.5–8.1)

## 2024-05-10 LAB — VITAMIN D 25 HYDROXY (VIT D DEFICIENCY, FRACTURES): Vit D, 25-Hydroxy: 43.99 ng/mL (ref 30–100)

## 2024-05-15 ENCOUNTER — Inpatient Hospital Stay: Payer: BC Managed Care – PPO | Admitting: Physician Assistant

## 2024-05-15 NOTE — Progress Notes (Unsigned)
 [facility] [address], [place] [address]   CLINIC:  Medical Oncology/Hematology  PCP:  Maree Isles, MD [address] [place] [address] [phone]   REASON FOR VISIT:  Follow-up for stage IIb right sided breast cancer (2017)  PRIOR THERAPY: - Neoadjuvant AC x 4, followed by Taxol  x 12 from 07/24/2016 through 12/04/2016 - Right lumpectomy 12/30/2016  CURRENT THERAPY: Anastrozole  (since August 2018)  BRIEF ONCOLOGIC HISTORY:   Oncology History  Breast cancer of upper-outer quadrant of right female breast (HCC)  06/24/2016 Mammogram   Diagnostic bilateral mammogram: highly suspicious 3.5 cm mass in the UO R breast with marked enlarged R axillary LN   06/24/2016 Breast US    2.7 x 3.1 x 3.5 cm slightly irregular hypoechoic mass at the 10:00 position of right breast, 6 cm from the nipple.  4.1 x 6 cm right axillar lymph node also identified.   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   07/21/2016 Imaging   CT CAP- 2.9 cm right upper outer quadrant breast mass, consistent with known primary breast carcinoma.   Right axillary and subpectoral lymphadenopathy, consistent with metastatic disease.   Tiny sub-cm bilateral pulmonary nodules are indeterminate, but favor postinflammatory etiology over early metastases. Recommend continued attention on follow-up CT.   No evidence of metastatic disease within the abdomen or pelvis.   07/23/2016 Procedure   Port placed by Dr. Aron   07/24/2016 - 12/04/2016 Neo-Adjuvant Chemotherapy   Adriamycin /Cytoxan  x 4, followed by weekly Taxol  x 12    07/30/2016 Imaging   Bone scan: Negative for osseous disease .   09/22/2016 Imaging   CT  head- 1. Normal CT of the brain. No evidence of intracranial metastatic disease. 2. Frothy secretions throughout most of the paranasal sinuses. Correlate clinically for acute sinusitis. This could serve as a source of headache.   12/30/2016 Surgery   Right lumpectomy with axillary lymph node dissection by Dr. Mavis. Surgical path: 1. Breast, lumpectomy, right - FIBROCYSTIC AND FIBROADENOMATOID CHANGE. - USUAL DUCTAL HYPERPLASIA. - TREATMENT EFFECT. - NO RESIDUAL CARCINOMA IDENTIFIED. - SEE ONCOLOGY TABLE. 2. Lymph nodes, regional resection, right axillary contents - NINE OF NINE LYMPH NODES NEGATIVE FOR CARCINOMA (0/9). - TREATMENT EFFECT. Final stage ypT0 ypN0     CANCER STAGING:  Cancer Staging  Breast cancer of upper-outer quadrant of right female breast [facility]) Staging form: Breast, AJCC 8th Edition - Clinical stage from 08/07/2016: ycT2, ycN1(sn), cM0, G3, ER+, PR-, HER2- - Signed by Berry Debby RAMAN, PA-C on 08/07/2016   INTERVAL HISTORY:   [name], a 68 y.o. female, returns for routine follow-up of her right-sided breast cancer.  [name] was last seen on 05/10/2023 by Dr. Rogers.   At today's visit, she  reports feeling fair.   She denies any recent hospitalizations, surgeries, or changes in her  baseline health status. She reports 75% energy and 100% appetite.  She is maintaining stable weight at this time.  She denies any new breast lumps or axillary lymphadenopathy. No new aches or pains.  No new headaches or abdominal pain. She has some ongoing fatigue ever since COVID infection in 2024.  She continues to take daily anastrozole ,  and is tolerating this well.  No major hot flashes or arthralgias. She takes a daily calcium and vitamin D .  ASSESSMENT & PLAN:  1.  Stage IIb (T2N1M0) right breast IDC, ER positive/PR negative/HER-2 negative: - Initial diagnosis by biopsy on 07/01/2016, Ki-67 of 90%. - Neoadjuvant AC x4 followed by Taxol  x12 from  07/24/2016 through 12/04/2016. - Status post right lumpectomy, L&D on 12/30/2016 with PCR, YPT0, YPN0. - Anastrozole  was started in August 2018.  Goal of treatment is 10 years (through August 2028). BCI: 18% recurrence rate after 5 years of AI therapy.  This is reduced by 67% with 10 years of AI therapy. - She had a mammogram on 09/27/2018 that was BI-RADS Category 4 suspicious.  Recommended stereotactic biopsy of the superior aspect of the lumpectomy site of increasing distortion and density. - Biopsy on 10/06/2018 of the right breast upper outer quadrant which shows benign breast parenchyma with fibrosis and hemosidern containing macrophages.  Negative for carcinoma. - Most recent mammogram (02/16/2024): BI-RADS Category 2, benign. - Most recent labs open entheses 05/10/2024): Normal CBC.  Normal LFTs.   - She is tolerating anastrozole  well.   - No red flag symptoms during visit today. - Physical exam (05/16/2024): Right breast upper outer lumpectomy scar within normal limits.  No discrete nodules or masses in either breast.  No axillary lymphadenopathy. - PLAN: Continue annual labs and office visit, next due November 2026. - Bilateral screening mammogram due 02/16/2025.  2.  Bone density: - DEXA scan (05/20/2017) showed T score of -0.6 normal. - DEXA scan (10/10/2019) showed a T score of -1.0. - Most recent DEXA (02/16/2024): T-score -1.4 (right femoral neck).  FRAX (not taking into account aromatase inhibitor) with 6.0% chance of major osteoporotic fracture in the next 10 years, and 0.6% risk of hip fracture in the next 10 years. - She takes daily calcium and vitamin D   - Most recent labs (05/10/2024) with calcium 9.8, vitamin D  43.99 - PLAN: No indication to start treatment for osteopenia based on FRAX. - Optimize bone health with calcium, vitamin D , weightbearing exercises. - Repeat DEXA in August 2027   3.   CKD stage IIIb - Dr. Rogers had previously referred to Dr. Rachele, but patient  preferred to hold off on establishing care with nephrology - Most recent labs (05/10/2024) with creatinine 1.55/GFR 37. - PLAN: Continue to recommend nephrology management due to worsening kidney function.  Patient is agreeable with referral to Dr. Rachele.  PLAN SUMMARY: >> Please enter referral to Dr. Rachele for CKD stage IIIb >> Mammogram due 02/16/2025 >> Labs in 1 year = CBC/D, CMP, vitamin D  >> OFFICE visit in 1 year (1 week after labs)    REVIEW OF SYSTEMS:   Review of Systems  Constitutional:  Positive for fatigue. Negative for appetite change, chills, diaphoresis, fever and unexpected weight change.  HENT:   Negative for lump/mass and nosebleeds.   Eyes:  Negative for eye problems.  Respiratory:  Positive for cough and shortness of breath. Negative for hemoptysis.   Cardiovascular:  Negative for chest pain, leg swelling and palpitations.  Gastrointestinal:  Negative for abdominal pain, blood in stool, constipation, diarrhea, nausea and vomiting.  Genitourinary:  Positive for frequency and nocturia. Negative for hematuria.   Musculoskeletal:  Positive for arthralgias.  Skin: Negative.   Neurological:  Positive for headaches and numbness. Negative for dizziness and light-headedness.  Hematological:  Does not bruise/bleed easily.    PHYSICAL EXAM:   Performance status (ECOG): 1 -  Symptomatic but completely ambulatory  Vitals:   05/16/24 1300 05/16/24 1304  BP: (!) 143/75 133/73  Pulse: 78   Resp: 18   Temp: (!) 96.9 F (36.1 C)   SpO2: 97%    Wt Readings from Last 3 Encounters:  05/16/24 209 lb (94.8 kg)  05/06/22 215 lb 8 oz (97.8 kg)  10/28/21 211 lb 9.6 oz (96 kg)   Physical Exam Constitutional:      Appearance: Normal appearance. She is obese.  Cardiovascular:     Heart sounds: Normal heart sounds.  Pulmonary:     Breath sounds: Normal breath sounds.  Chest:     Comments: (05/16/2024): Right breast upper outer lumpectomy scar within normal limits.  No  discrete nodules or masses in either breast.  No axillary lymphadenopathy. Neurological:     General: No focal deficit present.     Mental Status: Mental status is at baseline.  Psychiatric:        Behavior: Behavior normal. Behavior is cooperative.      PAST MEDICAL/SURGICAL HISTORY:  Past Medical History:  Diagnosis Date   Arthritis    Breast cancer of upper-outer quadrant of right female breast (HCC) 07/14/2016   Cancer (HCC)    Cough    dry   Diabetes mellitus without complication (HCC)    Hypertension    Ruptured eardrum    left    Past Surgical History:  Procedure Laterality Date   ABDOMINAL HYSTERECTOMY     with bilateral bso   AXILLARY LYMPH NODE DISSECTION Right 12/30/2016   Procedure: AXILLARY LYMPH NODE DISSECTION;  Surgeon: Mavis Anes, MD;  Location: [facility];  Service: General;  Laterality: Right;   CATARACT EXTRACTION W/PHACO Left 01/03/2020   Procedure: CATARACT EXTRACTION PHACO AND INTRAOCULAR LENS PLACEMENT LEFT EYE;  Surgeon: Harrie Agent, MD;  Location: [facility];  Service: Ophthalmology;  Laterality: Left;  CDE: 5.58   COLONOSCOPY     otif wrist Right    PARTIAL MASTECTOMY WITH NEEDLE LOCALIZATION Right 12/30/2016   Procedure: PARTIAL MASTECTOMY WITH NEEDLE LOCALIZATION;  Surgeon: Mavis Anes, MD;  Location: [facility];  Service: General;  Laterality: Right;   PORT-A-CATH REMOVAL Left 11/22/2020   Procedure: MINOR REMOVAL PORT-A-CATH;  Surgeon: Mavis Anes, MD;  Location: [facility];  Service: General;  Laterality: Left;  pt to be done at 9:15   PORTACATH PLACEMENT Left 07/23/2016   Procedure: INSERTION PORT-A-CATH;  Surgeon: Jina Nephew, MD;  Location: [facility];  Service: General;  Laterality: Left;   RADIOLOGY WITH ANESTHESIA N/A 08/19/2017   Procedure: MRI WITH ANESTHESIA OF BRAIN WITHOUT CONTRAST;  Surgeon: Radiologist, Medication, MD;  Location: [facility];  Service: Radiology;  Laterality: N/A;   ruptured ear drum Left     SOCIAL HISTORY:  Social  History   Socioeconomic History   Marital status: Single    Spouse name: Not on file   Number of children: Not on file   Years of education: Not on file   Highest education level: Not on file  Occupational History   Not on file  Tobacco Use   Smoking status: Former    Current packs/day: 0.50    Average packs/day: 0.5 packs/day for 4.0 years (2.0 ttl pk-yrs)    Types: Cigarettes   Smokeless tobacco: Never  Vaping Use   Vaping status: Never Used  Substance and Sexual Activity   Alcohol use: No   Drug use: No   Sexual activity: Not Currently    Birth control/protection: Surgical  Other Topics Concern   Not on file  Social History Narrative   Not on file   Social Drivers of Health   Financial Resource Strain: Low Risk  (10/22/2020)   Overall Financial Resource Strain (CARDIA)    Difficulty of Paying Living Expenses: Not very hard  Food Insecurity: No Food Insecurity (10/22/2020)   Hunger Vital Sign    Worried About Running Out of Food in the Last Year: Never true    Ran Out of Food in the Last Year: Never true  Transportation Needs: No Transportation Needs (12/11/2021)   Received from [facility] - Transportation    Lack of Transportation (Medical): No    Lack of Transportation (Non-Medical): No  Physical Activity: Inactive (10/22/2020)   Exercise Vital Sign    Days of Exercise per Week: 0 days    Minutes of Exercise per Session: 0 min  Stress: No Stress Concern Present (10/22/2020)   [employer] of Occupational Health - Occupational Stress Questionnaire    Feeling of Stress : Not at all  Social Connections: Unknown (10/22/2020)   Social Connection and Isolation Panel    Frequency of Communication with Friends and Family: More than three times a week    Frequency of Social Gatherings with Friends and Family: More than three times a week    Attends Religious Services: More than 4 times per year    Active Member of [employer] or Organizations: No    Attends  Engineer, Structural: More than 4 times per year    Marital Status: Not on file  Intimate Partner Violence: Not At Risk (10/22/2020)   Humiliation, Afraid, Rape, and Kick questionnaire    Fear of Current or Ex-Partner: No    Emotionally Abused: No    Physically Abused: No    Sexually Abused: No    FAMILY HISTORY:  Family History  Problem Relation Age of Onset   Heart disease Mother    Diabetes Mother    Liver disease Father    Stroke Brother     CURRENT MEDICATIONS:  Current Outpatient Medications  Medication Sig Dispense Refill   acetaminophen  (TYLENOL ) 650 MG CR tablet Take 1,300 mg by mouth every 8 (eight) hours as needed (pain).     amLODipine (NORVASC) 10 MG tablet Take 10 mg by mouth in the morning.     anastrozole  (ARIMIDEX ) 1 MG tablet TAKE 1 TABLET BY MOUTH EVERY DAY 90 tablet 3   aspirin EC 81 MG tablet Take 81 mg by mouth in the morning.     atorvastatin (LIPITOR) 10 MG tablet Take 10 mg by mouth daily in the afternoon.  4   Calcium-Phosphorus-Vitamin D  (CALCIUM GUMMIES PO) Take 1 tablet by mouth in the morning and at bedtime.     cholecalciferol (VITAMIN D ) 1000 units tablet Take 1,000 Units by mouth daily in the afternoon.     dapagliflozin propanediol (FARXIGA) 10 MG TABS tablet Take by mouth.     fluticasone (FLONASE) 50 MCG/ACT nasal spray Place 2 sprays into both nostrils daily.     glyBURIDE (DIABETA) 5 MG tablet Take 5 mg by mouth in the morning.     hydrochlorothiazide (HYDRODIURIL) 25 MG tablet Take 25 mg by mouth in the morning.     lisinopril (PRINIVIL,ZESTRIL) 40 MG tablet Take 40 mg by mouth in the morning.     meloxicam  (MOBIC ) 7.5 MG tablet TAKE 1 TABLET (7.5 MG TOTAL) BY MOUTH IN THE MORNING 30 tablet 5  pioglitazone (ACTOS) 45 MG tablet Take 45 mg by mouth daily.     No current facility-administered medications for this visit.    ALLERGIES:  No Known Allergies  LABORATORY DATA:  I have reviewed the labs as listed.     Latest Ref Rng  & Units 05/10/2024    1:51 PM 05/06/2023    3:26 PM 04/29/2022    2:13 PM  CBC  WBC 4.0 - 10.5 K/uL 6.2  6.2  6.9   Hemoglobin 12.0 - 15.0 g/dL 87.2  87.8  88.1   Hematocrit 36.0 - 46.0 % 38.4  36.5  35.8   Platelets 150 - 400 K/uL 194  203  220       Latest Ref Rng & Units 05/10/2024    1:51 PM 05/06/2023    3:26 PM 04/29/2022    2:13 PM  CMP  Glucose 70 - 99 mg/dL 97  880  889   BUN 8 - 23 mg/dL 26  27  32   Creatinine 0.44 - 1.00 mg/dL 8.44  8.51  8.52   Sodium 135 - 145 mmol/L 143  134  139   Potassium 3.5 - 5.1 mmol/L 3.8  3.4  3.4   Chloride 98 - 111 mmol/L 105  100  106   CO2 22 - 32 mmol/L 28  24  24    Calcium 8.9 - 10.3 mg/dL 9.8  9.5  9.7   Total Protein 6.5 - 8.1 g/dL 7.5  7.5  7.5   Total Bilirubin 0.0 - 1.2 mg/dL 0.4  0.6  0.4   Alkaline Phos 38 - 126 U/L 104  100  91   AST 15 - 41 U/L 24  20  18    ALT 0 - 44 U/L 17  20  18      DIAGNOSTIC IMAGING:  I have independently reviewed the scans and discussed with the patient. No results found.   WRAP UP:  All questions were answered. The patient knows to call the clinic with any problems, questions or concerns.  Medical decision making: Moderate  Time spent on visit: I spent 20 minutes counseling the patient face to face. The total time spent in the appointment was 30 minutes and more than 50% was on counseling.  Pleasant CHRISTELLA Barefoot, PA-C  05/16/24 1:30 PM

## 2024-05-16 ENCOUNTER — Inpatient Hospital Stay (HOSPITAL_BASED_OUTPATIENT_CLINIC_OR_DEPARTMENT_OTHER): Admitting: Physician Assistant

## 2024-05-16 VITALS — BP 133/73 | HR 78 | Temp 96.9°F | Resp 18 | Ht 64.5 in | Wt 209.0 lb

## 2024-05-16 DIAGNOSIS — C50411 Malignant neoplasm of upper-outer quadrant of right female breast: Secondary | ICD-10-CM | POA: Diagnosis not present

## 2024-05-16 DIAGNOSIS — E559 Vitamin D deficiency, unspecified: Secondary | ICD-10-CM

## 2024-05-16 DIAGNOSIS — Z170 Estrogen receptor positive status [ER+]: Secondary | ICD-10-CM | POA: Diagnosis not present

## 2024-05-16 DIAGNOSIS — M8580 Other specified disorders of bone density and structure, unspecified site: Secondary | ICD-10-CM

## 2024-05-16 NOTE — Patient Instructions (Signed)
 [facility] at [facility] **VISIT SUMMARY & IMPORTANT INSTRUCTIONS **   You were seen today by Pleasant Barefoot PA-C for your follow-up visit.    HISTORY OF BREAST CANCER Your most recent labs, physical exam, and mammogram (August 2025) did not show any evidence of recurrent breast cancer. Continue to take anastrozole  (Arimidex ) daily. Your next screening mammogram is due in August 2026. We we will see you for follow-up labs and physical exam in 1 year.  OSTEOPENIA You have osteopenia.  This means that you have bones that are slightly weaker and more fragile than normal. Your osteopenia is caused by age, postmenopausal status, and your anastrozole  (Arimidex ) pill which you are taking for treatment of your breast cancer. You do not need any specific treatment of osteopenia at this time. You should continue calcium, vitamin D , and weightbearing exercises (walking, body weight exercises, weightlifting) to help strengthen your bones. We will check another bone density scan in August 2027.  OTHER MEDICAL ISSUES: We have referred you to a kidney doctor / nephrologist (Dr. Rachele) for your chronic kidney disease.  FOLLOW-UP APPOINTMENT: 1 year  ** Thank you for trusting me with your healthcare!  I strive to provide all of my patients with quality care at each visit.  If you receive a survey for this visit, I would be so grateful to you for taking the time to provide feedback.  Thank you in advance!  ~ Loretto Belinsky                                        Dr. Mickiel Davonna Pleasant Barefoot, PA-C          Delon Hope, NP   - - - - - - - - - - - - - - - - - -    Thank you for choosing [facility] at [facility] to provide your oncology and hematology care.  To afford each patient quality time with our provider, please arrive at least 15 minutes before your scheduled appointment time.   If you have a lab appointment with the Cancer Center  please come in thru the Main Entrance and check in at the main information desk.  You need to re-schedule your appointment should you arrive 10 or more minutes late.  We strive to give you quality time with our providers, and arriving late affects you and other patients whose appointments are after yours.  Also, if you no show three or more times for appointments you may be dismissed from the clinic at the providers discretion.     Again, thank you for choosing [facility].  Our hope is that these requests will decrease the amount of time that you wait before being seen by our physicians.       _____________________________________________________________  Should you have questions after your visit to [facility], please contact our office at [phone] and follow the prompts.  Our office hours are 8:00 a.m. and 4:30 p.m. Monday - Friday.  Please note that voicemails left after 4:00 p.m. may not be returned until the following business day.  We are closed weekends and major holidays.  You do have access to a nurse 24-7, just call the main number to the clinic [phone] and do not press any options, hold on the line  and a nurse will answer the phone.    For prescription refill requests, have your pharmacy contact our office and allow 72 hours.

## 2024-06-30 ENCOUNTER — Ambulatory Visit (HOSPITAL_COMMUNITY)
Admission: RE | Admit: 2024-06-30 | Discharge: 2024-06-30 | Disposition: A | Discharge status: Home / Self Care | Source: Ambulatory Visit | Attending: Internal Medicine | Admitting: Internal Medicine

## 2024-06-30 ENCOUNTER — Other Ambulatory Visit (HOSPITAL_COMMUNITY): Payer: Self-pay | Admitting: Internal Medicine

## 2024-06-30 DIAGNOSIS — R52 Pain, unspecified: Secondary | ICD-10-CM

## 2024-07-01 ENCOUNTER — Encounter (HOSPITAL_COMMUNITY): Payer: Self-pay

## 2024-07-01 ENCOUNTER — Emergency Department (HOSPITAL_COMMUNITY)
Admission: EM | Admit: 2024-07-01 | Discharge: 2024-07-01 | Disposition: A | Discharge status: Home / Self Care | Attending: Emergency Medicine | Admitting: Emergency Medicine

## 2024-07-01 ENCOUNTER — Emergency Department (HOSPITAL_COMMUNITY)

## 2024-07-01 ENCOUNTER — Other Ambulatory Visit: Payer: Self-pay

## 2024-07-01 DIAGNOSIS — Z7982 Long term (current) use of aspirin: Secondary | ICD-10-CM | POA: Diagnosis not present

## 2024-07-01 DIAGNOSIS — I10 Essential (primary) hypertension: Secondary | ICD-10-CM | POA: Insufficient documentation

## 2024-07-01 DIAGNOSIS — Z79899 Other long term (current) drug therapy: Secondary | ICD-10-CM | POA: Diagnosis not present

## 2024-07-01 DIAGNOSIS — M5126 Other intervertebral disc displacement, lumbar region: Secondary | ICD-10-CM | POA: Insufficient documentation

## 2024-07-01 DIAGNOSIS — M5416 Radiculopathy, lumbar region: Secondary | ICD-10-CM | POA: Insufficient documentation

## 2024-07-01 DIAGNOSIS — N281 Cyst of kidney, acquired: Secondary | ICD-10-CM | POA: Insufficient documentation

## 2024-07-01 DIAGNOSIS — M545 Low back pain, unspecified: Secondary | ICD-10-CM | POA: Diagnosis present

## 2024-07-01 DIAGNOSIS — M5417 Radiculopathy, lumbosacral region: Secondary | ICD-10-CM

## 2024-07-01 DIAGNOSIS — M51369 Other intervertebral disc degeneration, lumbar region without mention of lumbar back pain or lower extremity pain: Secondary | ICD-10-CM

## 2024-07-01 LAB — URINALYSIS, ROUTINE W REFLEX MICROSCOPIC
Bacteria, UA: NONE SEEN
Bilirubin Urine: NEGATIVE
Glucose, UA: >=500 mg/dL — AB
Hgb urine dipstick: NEGATIVE
Ketones, ur: NEGATIVE mg/dL
Leukocytes,Ua: NEGATIVE
Nitrite: NEGATIVE
Protein, ur: NEGATIVE mg/dL
Specific Gravity, Urine: 1.027 (ref 1.005–1.030)
pH: 5 (ref 5.0–8.0)

## 2024-07-01 MED ORDER — PREDNISONE 20 MG PO TABS: 40.0000 mg | ORAL_TABLET | Freq: Every day | ORAL | 0 refills | Status: AC

## 2024-07-01 MED ORDER — CYCLOBENZAPRINE HCL 5 MG PO TABS: 5.0000 mg | ORAL_TABLET | Freq: Three times a day (TID) | ORAL | 0 refills | Status: AC | PRN

## 2024-07-01 MED ORDER — OXYCODONE-ACETAMINOPHEN 5-325 MG PO TABS
1.0000 | ORAL_TABLET | Freq: Once | ORAL | Status: AC
  Administered 2024-07-01: 1 via ORAL
  Filled 2024-07-01: qty 1

## 2024-07-01 MED ORDER — OXYCODONE-ACETAMINOPHEN 5-325 MG PO TABS: 1.0000 | ORAL_TABLET | Freq: Four times a day (QID) | ORAL | 0 refills | Status: AC | PRN

## 2024-07-01 NOTE — Discharge Instructions (Addendum)
 As we discussed, you have disc bulge causing pinched nerve in your back  Take prednisone  as prescribed  Take Percocet for severe pain  Take Flexeril  for muscle spasm  I have referred you to neurosurgery.  If needed, call you on Monday please give them a call for follow-up  Return to ER if you have worsening back pain or trouble walking or numbness or weakness

## 2024-07-01 NOTE — ED Provider Triage Note (Signed)
 Emergency Medicine Provider Triage Evaluation Note  [name] , a 68 y.o. female  was evaluated in triage.  Pt complains of back pain.  Patient has been seen and evaluated in the emergency department 2 separate times for this, was told that she may have an issue with her SI joint or sciatica, has been prescribed oxycodone  and Flexeril  and reports some improvement in her symptoms with these, she is not taking any medications for her symptoms currently.  Denies any radiation of her pain down her lower extremities, no numbness/tingling, no bowel/bladder incontinence, no fever.  No fall/trigger/inciting event.  Review of Systems  Positive: As above Negative: As above  Physical Exam  BP (!) 156/69   Pulse 91   Temp 98 F (36.7 C)   Resp 16   SpO2 98%  Gen:   Awake, no distress   Resp:  Normal effort  MSK:   Moves extremities without difficulty  Other:  Back: No midline spinous process tenderness, no bony deformity or step-off, no paraspinal muscle tenderness  Medical Decision Making  Medically screening exam initiated at 3:21 PM.  Appropriate orders placed.  [name] was informed that the remainder of the evaluation will be completed by another provider, this initial triage assessment does not replace that evaluation, and the importance of remaining in the ED until their evaluation is complete.     Glendia Rocky SAILOR, [place] 07/01/24 1525

## 2024-07-01 NOTE — ED Triage Notes (Signed)
 Pt gives verbal consent for mse

## 2024-07-01 NOTE — ED Provider Notes (Signed)
 " [facility] Provider Note   CSN: [id number] Arrival date & time: 07/01/24  1410     Patient presents with: Back Pain   [name] is a 68 y.o. female hx of HTN, here presenting with right sided back pain.  Patient was seen at [facility] 2 weeks ago and was thought to have SI joint pain.  Patient was prescribed oxycodone  and Flexeril  and ran out of her medicines.  Patient was seen yesterday at [facility] and was thought to have musculoskeletal pain and x-ray was done that showed possible arthritis.  Patient was given Toradol  and prescribed diclofenac .  She states that since yesterday she has been having intermittent right flank pain and back pain.  Denies any pain radiating down the legs.  Denies trouble walking   The history is provided by the patient.       Prior to Admission medications  Medication Sig Start Date End Date Taking? Authorizing Provider  acetaminophen  (TYLENOL ) 650 MG CR tablet Take 1,300 mg by mouth every 8 (eight) hours as needed (pain).    [provider]  amLODipine (NORVASC) 10 MG tablet Take 10 mg by mouth in the morning. 10/31/13   [provider]  anastrozole  (ARIMIDEX ) 1 MG tablet TAKE 1 TABLET BY MOUTH EVERY DAY 08/09/23   Rogers Hai, MD  aspirin EC 81 MG tablet Take 81 mg by mouth in the morning.    [provider]  atorvastatin (LIPITOR) 10 MG tablet Take 10 mg by mouth daily in the afternoon. 05/17/16   [provider]  Calcium-Phosphorus-Vitamin D  (CALCIUM GUMMIES PO) Take 1 tablet by mouth in the morning and at bedtime.    [provider]  cholecalciferol (VITAMIN D ) 1000 units tablet Take 1,000 Units by mouth daily in the afternoon.    [provider]  dapagliflozin propanediol (FARXIGA) 10 MG TABS tablet Take by mouth. 06/02/21   [provider]  fluticasone (FLONASE) 50 MCG/ACT nasal spray Place 2 sprays into both nostrils  daily. 09/06/21   [provider]  glyBURIDE (DIABETA) 5 MG tablet Take 5 mg by mouth in the morning.    [provider]  hydrochlorothiazide (HYDRODIURIL) 25 MG tablet Take 25 mg by mouth in the morning. 10/31/13   [provider]  lisinopril (PRINIVIL,ZESTRIL) 40 MG tablet Take 40 mg by mouth in the morning. 10/31/13   [provider]  meloxicam  (MOBIC ) 7.5 MG tablet TAKE 1 TABLET (7.5 MG TOTAL) BY MOUTH IN THE MORNING 03/22/24   Margrette Taft BRAVO, MD  pioglitazone (ACTOS) 45 MG tablet Take 45 mg by mouth daily. 04/21/24   [provider]    Allergies: Patient has no known allergies.    Review of Systems  Musculoskeletal:  Positive for back pain.  All other systems reviewed and are negative.   Updated Vital Signs BP (!) 144/69   Pulse 76   Temp 98.3 F (36.8 C) (Oral)   Resp 18   SpO2 98%   Physical Exam Vitals and nursing note reviewed.  Constitutional:      Comments: Slightly uncomfortable  HENT:     Head: Normocephalic.     Nose: Nose normal.     Mouth/Throat:     Mouth: Mucous membranes are moist.  Eyes:     Extraocular Movements: Extraocular movements intact.     Pupils: Pupils are equal, round, and reactive to light.  Cardiovascular:     Rate and Rhythm: Normal  rate and regular rhythm.     Pulses: Normal pulses.     Heart sounds: Normal heart sounds.  Pulmonary:     Effort: Pulmonary effort is normal.     Breath sounds: Normal breath sounds.  Abdominal:     General: Abdomen is flat.     Palpations: Abdomen is soft.  Musculoskeletal:        General: Normal range of motion.     Cervical back: Normal range of motion and neck supple.     Comments: R paralumbar vs CVAT   Neurological:     General: No focal deficit present.     Mental Status: She is oriented to person, place, and time.     Comments: No saddle anesthesia.  Negative straight leg raise bilaterally  Psychiatric:        Mood and Affect: Mood normal.         Behavior: Behavior normal.     (all labs ordered are listed, but only abnormal results are displayed) Labs Reviewed  URINALYSIS, ROUTINE W REFLEX MICROSCOPIC - Abnormal; Notable for the following components:      Result Value   Glucose, UA >=500 (*)    All other components within normal limits    EKG: None  Radiology: No results found.   Procedures   Medications Ordered in the ED  oxyCODONE -acetaminophen  (PERCOCET/ROXICET) 5-325 MG per tablet 1 tablet (has no administration in time range)                                    Medical Decision Making [name] is a 68 y.o. female here presenting with back pain.  Likely renal colic or SI joint pain or mild sciatica.  Patient had labs recently that were unremarkable.  Plan to get UA and CT renal stone and CT lumbar spine.   10:37 PM I reviewed patient's urinalysis and it was unremarkable.  CT renal stone showed left renal cyst.  Patient's CT lumbar spine showed L4-5 disc protrusion causing nerve root compression.  Patient will be discharged home with course of steroids and pain meds and muscle relaxant and referral to spine surgery  Problems Addressed: L4-L5 disc bulge: acute illness or injury Lumbosacral radiculopathy at L5: acute illness or injury Renal cyst: undiagnosed new problem with uncertain prognosis  Amount and/or Complexity of Data Reviewed Radiology: ordered.  Risk Prescription drug management.     Final diagnoses:  None    ED Discharge Orders     None          Patt Alm Macho, MD 07/01/24 2239  "

## 2024-07-01 NOTE — ED Triage Notes (Signed)
 Pt c.o lower back pain x 2 weeks, states she has been to 2 different ERs and no one can tell her what is wrong. Pt ambulatory into the ED.

## 2024-07-18 ENCOUNTER — Other Ambulatory Visit (HOSPITAL_COMMUNITY): Payer: Self-pay | Admitting: Neurosurgery

## 2024-07-18 ENCOUNTER — Other Ambulatory Visit: Payer: Self-pay | Admitting: Neurosurgery

## 2024-07-19 ENCOUNTER — Other Ambulatory Visit (HOSPITAL_COMMUNITY): Payer: Self-pay | Admitting: Neurosurgery

## 2024-07-19 DIAGNOSIS — M5126 Other intervertebral disc displacement, lumbar region: Secondary | ICD-10-CM

## 2024-07-28 ENCOUNTER — Ambulatory Visit (HOSPITAL_COMMUNITY)
Admission: RE | Admit: 2024-07-28 | Discharge: 2024-07-28 | Disposition: A | Source: Ambulatory Visit | Attending: Neurosurgery | Admitting: Neurosurgery

## 2024-07-28 ENCOUNTER — Other Ambulatory Visit: Payer: Self-pay

## 2024-07-28 DIAGNOSIS — M48061 Spinal stenosis, lumbar region without neurogenic claudication: Secondary | ICD-10-CM | POA: Insufficient documentation

## 2024-07-28 DIAGNOSIS — M5126 Other intervertebral disc displacement, lumbar region: Secondary | ICD-10-CM

## 2024-07-28 DIAGNOSIS — M5136 Other intervertebral disc degeneration, lumbar region with discogenic back pain only: Secondary | ICD-10-CM | POA: Diagnosis not present

## 2024-07-28 DIAGNOSIS — M47816 Spondylosis without myelopathy or radiculopathy, lumbar region: Secondary | ICD-10-CM | POA: Insufficient documentation

## 2024-07-28 DIAGNOSIS — M2428 Disorder of ligament, vertebrae: Secondary | ICD-10-CM | POA: Diagnosis not present

## 2024-07-28 DIAGNOSIS — M5416 Radiculopathy, lumbar region: Secondary | ICD-10-CM | POA: Diagnosis present

## 2024-07-28 DIAGNOSIS — I700 Atherosclerosis of aorta: Secondary | ICD-10-CM | POA: Insufficient documentation

## 2024-07-28 DIAGNOSIS — M4804 Spinal stenosis, thoracic region: Secondary | ICD-10-CM | POA: Insufficient documentation

## 2024-07-28 LAB — GLUCOSE, CAPILLARY: Glucose-Capillary: 155 mg/dL — ABNORMAL HIGH (ref 70–99)

## 2024-07-28 MED ORDER — DIAZEPAM 5 MG PO TABS
10.0000 mg | ORAL_TABLET | Freq: Once | ORAL | Status: AC
  Administered 2024-07-28: 10 mg via ORAL
  Filled 2024-07-28: qty 2

## 2024-07-28 MED ORDER — LIDOCAINE HCL (PF) 1 % IJ SOLN
5.0000 mL | Freq: Once | INTRAMUSCULAR | Status: AC
  Administered 2024-07-28: 5 mL

## 2024-07-28 MED ORDER — ONDANSETRON HCL 4 MG/2ML IJ SOLN: 4.0000 mg | Freq: Four times a day (QID) | INTRAMUSCULAR | Status: DC | PRN

## 2024-07-28 MED ORDER — OXYCODONE HCL 5 MG PO TABS: 5.0000 mg | ORAL_TABLET | ORAL | Status: DC | PRN

## 2024-07-28 MED ORDER — IOHEXOL 180 MG/ML  SOLN
20.0000 mL | Freq: Once | INTRAMUSCULAR | Status: AC | PRN
  Administered 2024-07-28: 20 mL via INTRATHECAL

## 2024-07-28 NOTE — Op Note (Signed)
*   No surgery found * lumbar Myelogram  PATIENT:  [name] is a 69 y.o. female With lumbar radiculopathy PRE-OPERATIVE DIAGNOSIS:  lumbar radiculopathy  POST-OPERATIVE DIAGNOSIS:  same  PROCEDURE:  Lumbar Myelogram  SURGEON:  Jerick Khachatryan  ANESTHESIA:   local LOCAL MEDICATIONS USED:  LIDOCAINE   Procedure Note: [name] is a 69 y.o. female Was taken to the fluoroscopy suite and  positioned prone on the fluoroscopy table. Her back was prepared and draped in a sterile manner. I infiltrated 4 cc into the lumbar region. I then introduced a spinal needle into the thecal sac at the L5/S1 interlaminar space. I infiltrated 20cc of Isovue  180 into the thecal sac. Fluoroscopy showed the needle and contrast in the thecal sac. [name] tolerated the procedure well. she Will be taken to CT for evaluation.     PATIENT DISPOSITION:  Short Stay

## 2024-07-28 NOTE — Progress Notes (Signed)
 Patient was given discharge instructions. She verbalized understanding.

## 2024-07-31 ENCOUNTER — Other Ambulatory Visit: Payer: Self-pay

## 2024-07-31 MED ORDER — ANASTROZOLE 1 MG PO TABS: 1.0000 mg | ORAL_TABLET | Freq: Every day | ORAL | 3 refills | Status: AC

## 2025-02-19 ENCOUNTER — Ambulatory Visit (HOSPITAL_COMMUNITY)

## 2025-05-09 ENCOUNTER — Inpatient Hospital Stay

## 2025-05-16 ENCOUNTER — Inpatient Hospital Stay: Admitting: Physician Assistant
